# Patient Record
Sex: Male | Born: 1950 | Race: White | Hispanic: No | Marital: Married | Smoking: Current every day smoker
Health system: Southern US, Community
[De-identification: ages and names within clinical notes are randomized; demographics above are authoritative.]

## PROBLEM LIST (undated history)

## (undated) DIAGNOSIS — I10 Essential (primary) hypertension: Secondary | ICD-10-CM

## (undated) DIAGNOSIS — E785 Hyperlipidemia, unspecified: Secondary | ICD-10-CM

## (undated) HISTORY — DX: Hyperlipidemia, unspecified: E78.5

---

## 1999-10-02 ENCOUNTER — Encounter (INDEPENDENT_AMBULATORY_CARE_PROVIDER_SITE_OTHER): Payer: Self-pay | Admitting: Specialist

## 1999-10-02 ENCOUNTER — Other Ambulatory Visit: Admission: RE | Admit: 1999-10-02 | Discharge: 1999-10-02 | Payer: Self-pay | Admitting: Internal Medicine

## 2003-09-24 ENCOUNTER — Encounter: Admission: RE | Admit: 2003-09-24 | Discharge: 2003-09-24 | Payer: Self-pay | Admitting: Family Medicine

## 2003-09-24 ENCOUNTER — Encounter: Payer: Self-pay | Admitting: Family Medicine

## 2003-10-29 ENCOUNTER — Encounter: Payer: Self-pay | Admitting: Internal Medicine

## 2003-10-29 ENCOUNTER — Ambulatory Visit (HOSPITAL_COMMUNITY): Admission: RE | Admit: 2003-10-29 | Discharge: 2003-10-29 | Payer: Self-pay | Admitting: Internal Medicine

## 2004-02-26 ENCOUNTER — Inpatient Hospital Stay (HOSPITAL_COMMUNITY): Admission: AD | Admit: 2004-02-26 | Discharge: 2004-02-29 | Payer: Self-pay | Admitting: Family Medicine

## 2004-10-21 ENCOUNTER — Ambulatory Visit: Payer: Self-pay | Admitting: Family Medicine

## 2004-10-23 ENCOUNTER — Ambulatory Visit: Payer: Self-pay | Admitting: Family Medicine

## 2005-01-27 ENCOUNTER — Ambulatory Visit: Payer: Self-pay | Admitting: Cardiology

## 2005-04-16 ENCOUNTER — Inpatient Hospital Stay (HOSPITAL_COMMUNITY): Admission: RE | Admit: 2005-04-16 | Discharge: 2005-04-17 | Payer: Self-pay | Admitting: Orthopedic Surgery

## 2005-06-06 ENCOUNTER — Inpatient Hospital Stay (HOSPITAL_COMMUNITY): Admission: EM | Admit: 2005-06-06 | Discharge: 2005-06-14 | Payer: Self-pay | Admitting: Emergency Medicine

## 2005-06-06 ENCOUNTER — Ambulatory Visit: Payer: Self-pay | Admitting: Internal Medicine

## 2005-06-10 ENCOUNTER — Encounter: Payer: Self-pay | Admitting: Neurology

## 2005-06-18 ENCOUNTER — Ambulatory Visit: Payer: Self-pay | Admitting: Psychiatry

## 2005-06-18 ENCOUNTER — Inpatient Hospital Stay (HOSPITAL_COMMUNITY): Admission: RE | Admit: 2005-06-18 | Discharge: 2005-06-22 | Payer: Self-pay | Admitting: Psychiatry

## 2005-06-23 ENCOUNTER — Other Ambulatory Visit (HOSPITAL_COMMUNITY): Admission: RE | Admit: 2005-06-23 | Discharge: 2005-07-07 | Payer: Self-pay | Admitting: Psychiatry

## 2005-07-09 ENCOUNTER — Ambulatory Visit: Payer: Self-pay | Admitting: Internal Medicine

## 2005-07-13 ENCOUNTER — Ambulatory Visit (HOSPITAL_COMMUNITY): Payer: Self-pay | Admitting: Psychiatry

## 2005-07-29 ENCOUNTER — Ambulatory Visit (HOSPITAL_COMMUNITY): Payer: Self-pay | Admitting: Psychiatry

## 2005-08-26 ENCOUNTER — Ambulatory Visit (HOSPITAL_COMMUNITY): Payer: Self-pay | Admitting: Psychiatry

## 2005-10-28 ENCOUNTER — Ambulatory Visit (HOSPITAL_COMMUNITY): Payer: Self-pay | Admitting: Psychiatry

## 2005-11-30 ENCOUNTER — Ambulatory Visit (HOSPITAL_COMMUNITY): Payer: Self-pay | Admitting: Psychiatry

## 2006-03-17 ENCOUNTER — Ambulatory Visit (HOSPITAL_COMMUNITY): Payer: Self-pay | Admitting: Psychiatry

## 2006-05-07 ENCOUNTER — Ambulatory Visit: Payer: Self-pay | Admitting: Family Medicine

## 2006-05-14 ENCOUNTER — Ambulatory Visit: Payer: Self-pay | Admitting: Family Medicine

## 2006-06-18 ENCOUNTER — Ambulatory Visit: Payer: Self-pay | Admitting: Family Medicine

## 2006-06-22 ENCOUNTER — Ambulatory Visit (HOSPITAL_COMMUNITY): Payer: Self-pay | Admitting: Psychiatry

## 2006-06-29 ENCOUNTER — Emergency Department (HOSPITAL_COMMUNITY): Admission: EM | Admit: 2006-06-29 | Discharge: 2006-06-29 | Payer: Self-pay | Admitting: Emergency Medicine

## 2006-06-29 ENCOUNTER — Inpatient Hospital Stay (HOSPITAL_COMMUNITY): Admission: RE | Admit: 2006-06-29 | Discharge: 2006-07-10 | Payer: Self-pay | Admitting: Psychiatry

## 2006-06-30 ENCOUNTER — Ambulatory Visit: Payer: Self-pay | Admitting: Psychiatry

## 2006-07-03 ENCOUNTER — Ambulatory Visit (HOSPITAL_COMMUNITY): Admission: RE | Admit: 2006-07-03 | Discharge: 2006-07-03 | Payer: Self-pay | Admitting: Psychiatry

## 2006-07-12 ENCOUNTER — Other Ambulatory Visit (HOSPITAL_COMMUNITY): Admission: RE | Admit: 2006-07-12 | Discharge: 2006-09-02 | Payer: Self-pay | Admitting: Psychiatry

## 2006-07-16 ENCOUNTER — Ambulatory Visit (HOSPITAL_COMMUNITY): Payer: Self-pay | Admitting: Psychiatry

## 2006-07-27 ENCOUNTER — Ambulatory Visit (HOSPITAL_COMMUNITY): Admission: RE | Admit: 2006-07-27 | Discharge: 2006-07-27 | Payer: Self-pay | Admitting: Neurology

## 2006-08-02 ENCOUNTER — Ambulatory Visit (HOSPITAL_COMMUNITY): Payer: Self-pay | Admitting: Psychiatry

## 2007-05-10 ENCOUNTER — Ambulatory Visit: Payer: Self-pay | Admitting: Family Medicine

## 2007-05-10 LAB — CONVERTED CEMR LAB
ALT: 17 units/L (ref 0–40)
AST: 30 units/L (ref 0–37)
Albumin: 4.2 g/dL (ref 3.5–5.2)
BUN: 6 mg/dL (ref 6–23)
Basophils Relative: 0.1 % (ref 0.0–1.0)
CO2: 31 meq/L (ref 19–32)
Chloride: 102 meq/L (ref 96–112)
Creatinine, Ser: 0.9 mg/dL (ref 0.4–1.5)
Direct LDL: 135.5 mg/dL
Eosinophils Relative: 0.8 % (ref 0.0–5.0)
GFR calc Af Amer: 111 mL/min
GFR calc non Af Amer: 92 mL/min
Glucose, Bld: 99 mg/dL (ref 70–99)
Lymphocytes Relative: 30.2 % (ref 12.0–46.0)
MCHC: 33.8 g/dL (ref 30.0–36.0)
MCV: 93.7 fL (ref 78.0–100.0)
Neutro Abs: 4.7 10*3/uL (ref 1.4–7.7)
Neutrophils Relative %: 60.8 % (ref 43.0–77.0)
Platelets: 244 10*3/uL (ref 150–400)
Potassium: 3.4 meq/L — ABNORMAL LOW (ref 3.5–5.1)
TSH: 0.77 microintl units/mL (ref 0.35–5.50)
Total Bilirubin: 0.7 mg/dL (ref 0.3–1.2)
Total CHOL/HDL Ratio: 6.4

## 2007-05-17 ENCOUNTER — Ambulatory Visit: Payer: Self-pay | Admitting: Family Medicine

## 2007-06-07 ENCOUNTER — Ambulatory Visit: Payer: Self-pay | Admitting: Gastroenterology

## 2007-06-21 ENCOUNTER — Ambulatory Visit: Payer: Self-pay | Admitting: Gastroenterology

## 2008-02-28 DIAGNOSIS — J069 Acute upper respiratory infection, unspecified: Secondary | ICD-10-CM | POA: Insufficient documentation

## 2008-03-02 ENCOUNTER — Ambulatory Visit: Payer: Self-pay | Admitting: Family Medicine

## 2008-03-03 DIAGNOSIS — F329 Major depressive disorder, single episode, unspecified: Secondary | ICD-10-CM

## 2008-03-03 DIAGNOSIS — I10 Essential (primary) hypertension: Secondary | ICD-10-CM | POA: Insufficient documentation

## 2008-03-03 DIAGNOSIS — E785 Hyperlipidemia, unspecified: Secondary | ICD-10-CM

## 2008-03-03 DIAGNOSIS — J449 Chronic obstructive pulmonary disease, unspecified: Secondary | ICD-10-CM

## 2008-03-03 DIAGNOSIS — F068 Other specified mental disorders due to known physiological condition: Secondary | ICD-10-CM

## 2008-03-03 DIAGNOSIS — G609 Hereditary and idiopathic neuropathy, unspecified: Secondary | ICD-10-CM | POA: Insufficient documentation

## 2008-03-03 DIAGNOSIS — F411 Generalized anxiety disorder: Secondary | ICD-10-CM | POA: Insufficient documentation

## 2008-04-02 ENCOUNTER — Telehealth: Payer: Self-pay | Admitting: Family Medicine

## 2008-04-07 ENCOUNTER — Encounter: Payer: Self-pay | Admitting: Family Medicine

## 2008-05-18 ENCOUNTER — Ambulatory Visit: Payer: Self-pay | Admitting: Family Medicine

## 2008-05-18 DIAGNOSIS — M216X9 Other acquired deformities of unspecified foot: Secondary | ICD-10-CM

## 2008-05-18 DIAGNOSIS — I080 Rheumatic disorders of both mitral and aortic valves: Secondary | ICD-10-CM | POA: Insufficient documentation

## 2008-11-27 ENCOUNTER — Telehealth: Payer: Self-pay | Admitting: *Deleted

## 2009-04-02 ENCOUNTER — Emergency Department (HOSPITAL_COMMUNITY): Admission: EM | Admit: 2009-04-02 | Discharge: 2009-04-02 | Payer: Self-pay | Admitting: Emergency Medicine

## 2009-04-27 IMAGING — CT CT HEAD W/O CM
1 of 2 series · 13 of 30 positions shown, 17 images · non-contrast
Comparison: 06/06/2005 and brain MR dated 07/03/2006.

CLINICAL DATA: Seizure today.  One previous seizure 3 years ago.
Taking antiseizure medications.

CT HEAD WITHOUT CONTRAST
TECHNIQUE: Contiguous axial images were obtained from the base of
the skull through the vertex without contrast.

[Series 2: brain · axial · 0.47mm/px · z∈[+148,+279]mm · 13 of 28 slices shown, 17 images]
[im 2/28  brain]
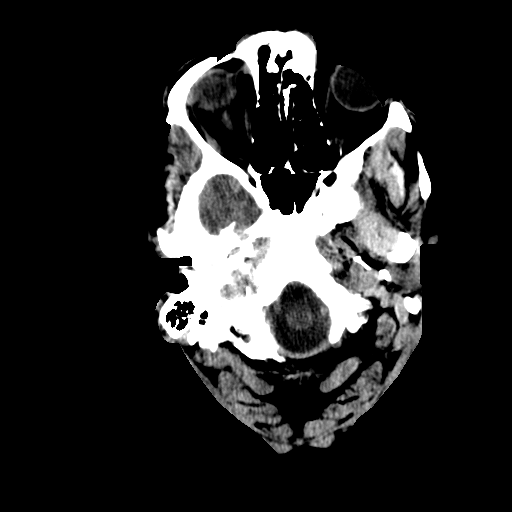
[im 2/28  bone]
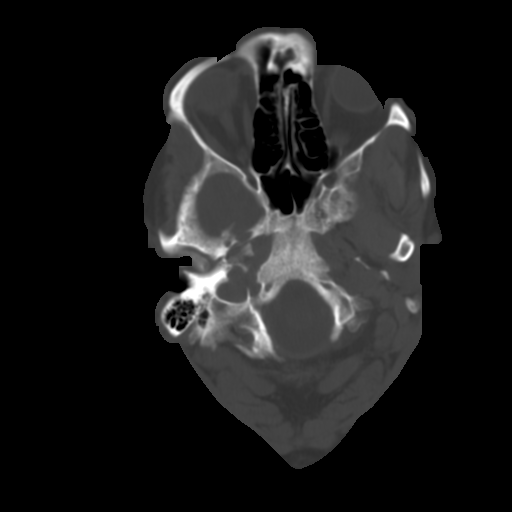
[im 4/28  brain]
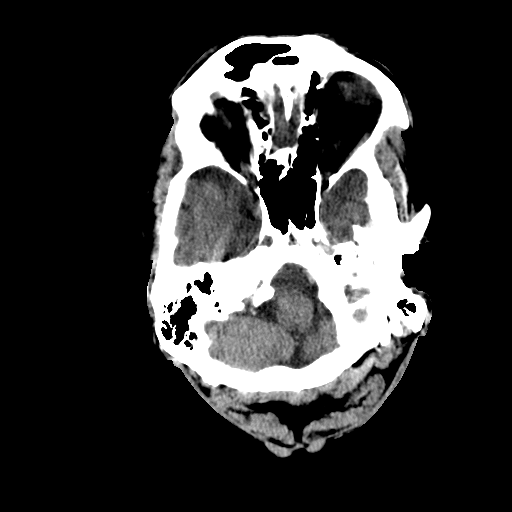
[im 6/28  brain]
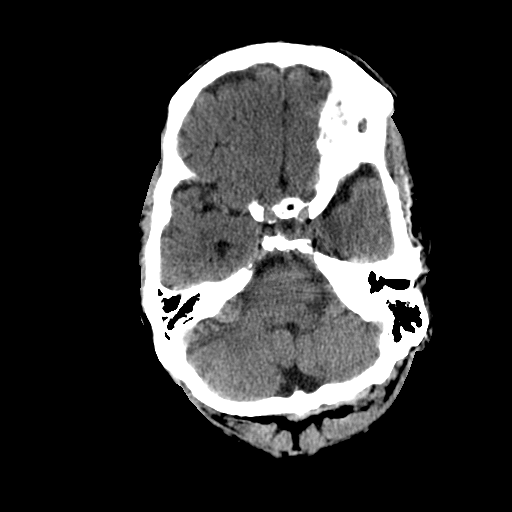
[im 8/28  brain]
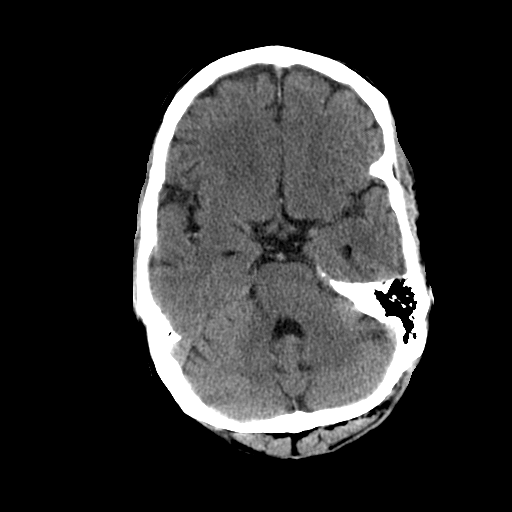
[im 10/28  brain]
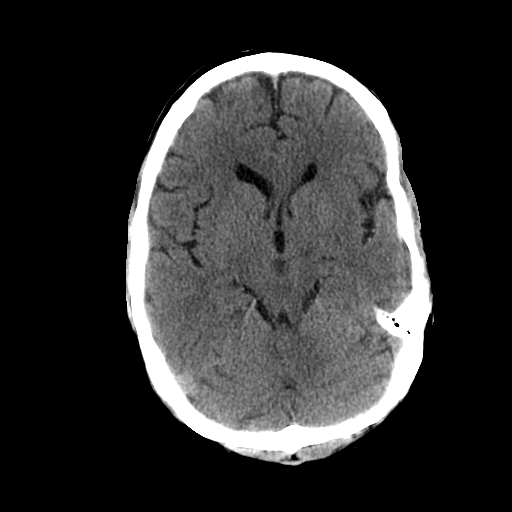
[im 10/28  bone]
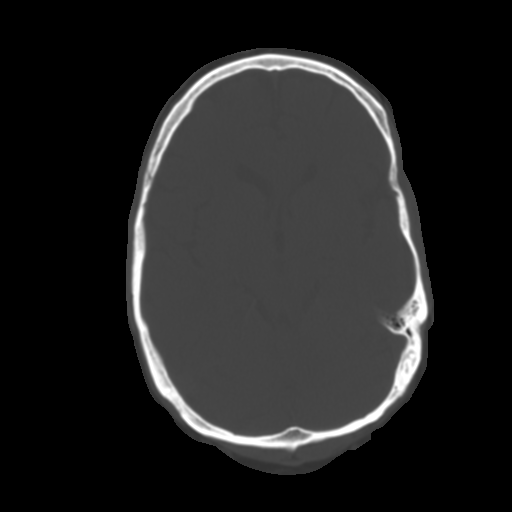
[im 12/28  brain]
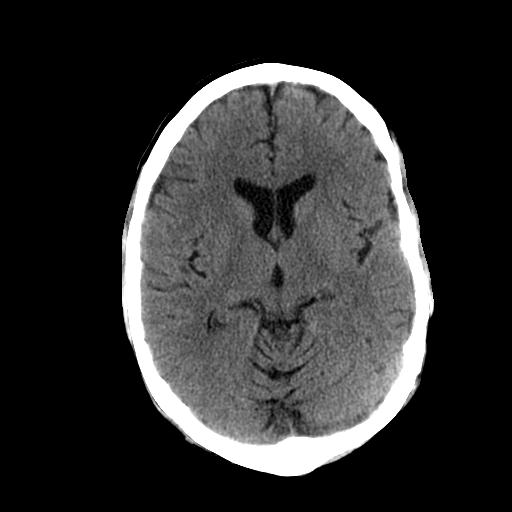
[im 14/28  brain]
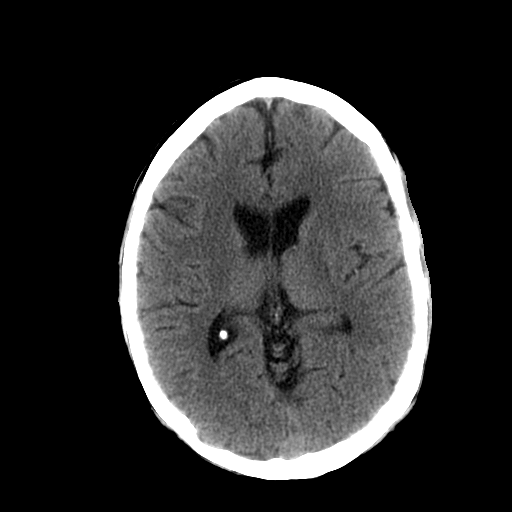
[im 16/28  brain]
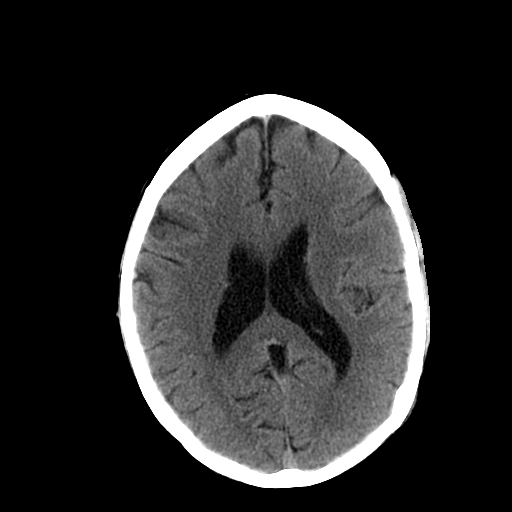
[im 18/28  brain]
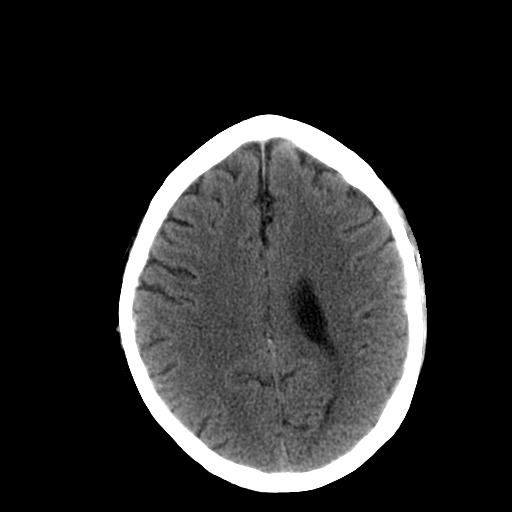
[im 18/28  bone]
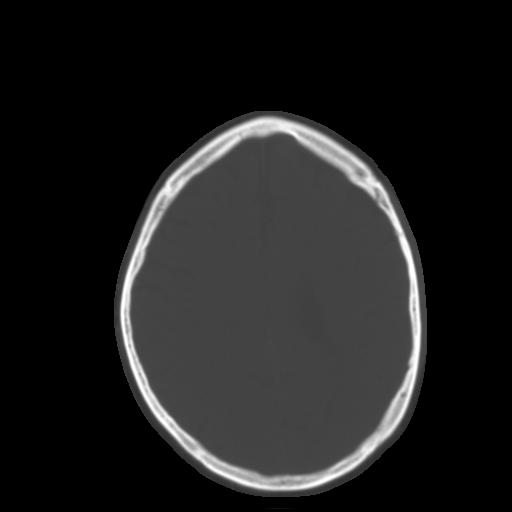
[im 20/28  brain]
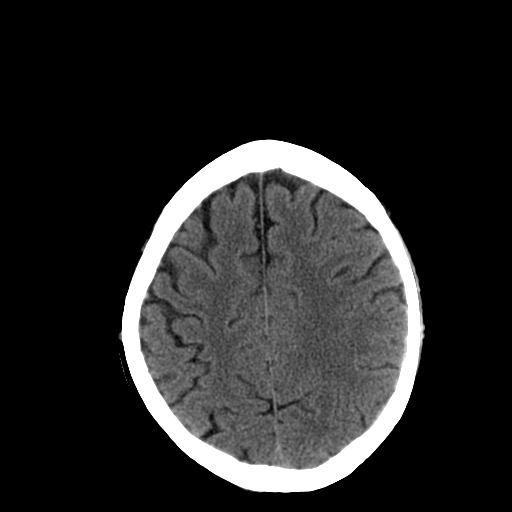
[im 22/28  brain]
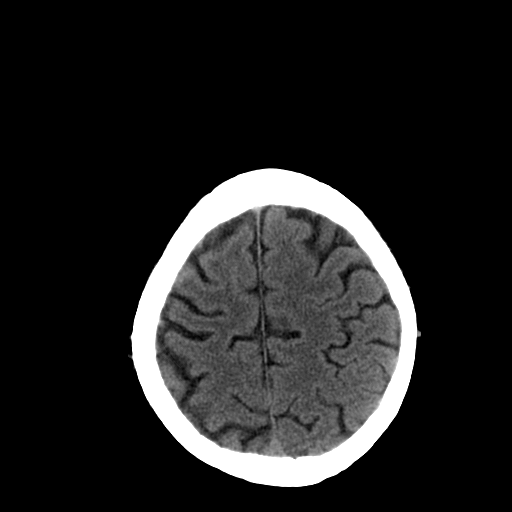
[im 24/28  brain]
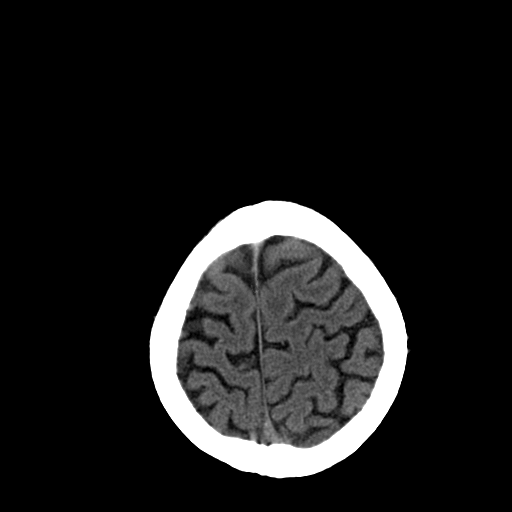
[im 26/28  brain]
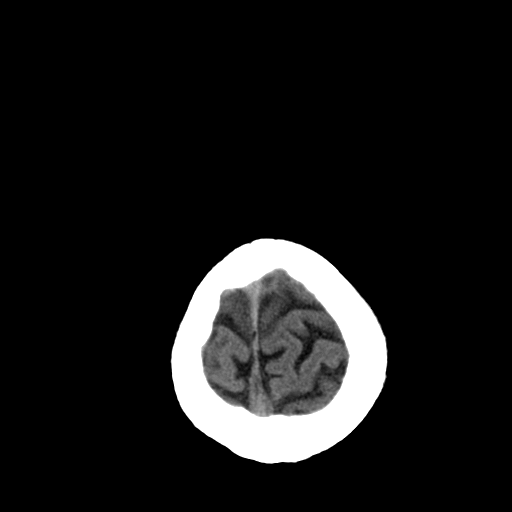
[im 26/28  bone]
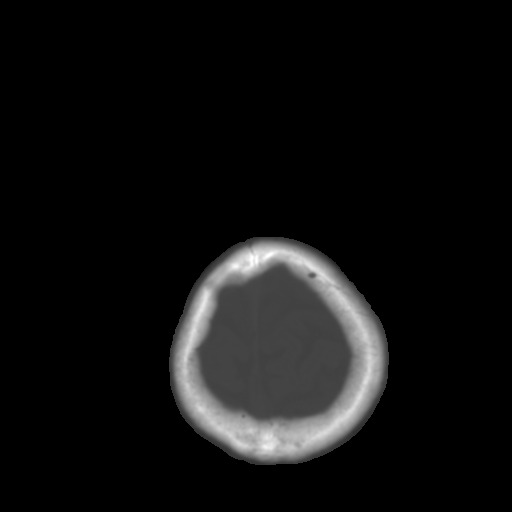

[13 of 30 positions shown; findings below may reference images not displayed]

FINDINGS: Stable minimally enlarged ventricles and subarachnoid
spaces.  No intracranial hemorrhage, mass lesions or CT evidence of
acute infarction.  Unremarkable bones and included paranasal
sinuses.
IMPRESSION: Stable minimal diffuse cerebral and cerebellar atrophy.  No acute
abnormality.

## 2009-06-04 ENCOUNTER — Ambulatory Visit: Payer: Self-pay | Admitting: Family Medicine

## 2009-06-04 LAB — CONVERTED CEMR LAB
ALT: 17 units/L (ref 0–53)
AST: 35 units/L (ref 0–37)
Alkaline Phosphatase: 98 units/L (ref 39–117)
BUN: 9 mg/dL (ref 6–23)
Basophils Absolute: 0 10*3/uL (ref 0.0–0.1)
Basophils Relative: 0.2 % (ref 0.0–3.0)
Bilirubin, Direct: 0 mg/dL (ref 0.0–0.3)
Blood in Urine, dipstick: NEGATIVE
Calcium: 9.3 mg/dL (ref 8.4–10.5)
GFR calc non Af Amer: 80.98 mL/min (ref >60)
Glucose, Bld: 110 mg/dL — ABNORMAL HIGH (ref 70–99)
Glucose, Urine, Semiquant: NEGATIVE
Lymphs Abs: 1.6 10*3/uL (ref 0.7–4.0)
MCHC: 36 g/dL (ref 30.0–36.0)
MCV: 94 fL (ref 78.0–100.0)
Monocytes Absolute: 0.4 10*3/uL (ref 0.1–1.0)
RBC: 4.36 M/uL (ref 4.22–5.81)
RDW: 12.5 % (ref 11.5–14.6)
Total Protein: 7.1 g/dL (ref 6.0–8.3)
WBC: 6.2 10*3/uL (ref 4.5–10.5)

## 2009-06-11 ENCOUNTER — Ambulatory Visit: Payer: Self-pay | Admitting: Family Medicine

## 2009-08-13 ENCOUNTER — Telehealth: Payer: Self-pay | Admitting: *Deleted

## 2010-10-17 ENCOUNTER — Encounter: Payer: Self-pay | Admitting: Family Medicine

## 2010-10-17 ENCOUNTER — Ambulatory Visit: Payer: Self-pay | Admitting: Family Medicine

## 2011-01-15 NOTE — Assessment & Plan Note (Signed)
Summary: med /pt rescd//ccm   Vital Signs:  Patient profile:   60 year old male Height:      68.5 inches Weight:      206 pounds BMI:     30.98 Temp:     98.3 degrees F oral BP sitting:   124 / 90  (left arm) Cuff size:   regular  Vitals Entered By: Kern Reap CMA Duncan Dull) (October 17, 2010 9:50 AM) CC: follow-up visit   CC:  follow-up visit.  History of Present Illness: Cody Perez is a 60 year old male, who comes in today for general medical checkup.  He states he had his lab work done through his wife's employment.  However, we don't have a copy of the lab work.  He takes Tenoretic 50 -- 25 one half tab daily for hy C. list of medications.  She has them on.  History [place], care, dental care, colonoscopy 2000, normal, tetanus, 2010, seasonal flu shot today.  He has quit smoking........Marland Kitchenpertension.  BP 124/90.  He takes simvastatin 10 mg nightly for hyperlipidemia, along with an aspirin tablet.  He uses Viagra p.r.n. for rectal dysfunction.  His biggest long-term problem is his mental health problems, which include anxiety, dementia, and depression, which are treated by Dr. Nolen Mu  Allergies: No Known Drug Allergies  Past History:  Past medical, surgical, family and social histories (including risk factors) reviewed, and no changes noted (except as noted below).  Past Medical History: Reviewed history from 05/18/2008 and no changes required. Anxiety COPD Dementia Depression bipolar Hyperlipidemia Hypertension Peripheral neuropathy erectile dysfunction  Family History: Reviewed history from 03/02/2008 and no changes required. adopted  Social History: Reviewed history from 03/02/2008 and no changes required. Retired Married Former Smoker Alcohol use-no Drug use-no Regular exercise-yes  Review of Systems      See HPI       Flu Vaccine Consent Questions     Do you have a history of severe allergic reactions to this vaccine? no    Any prior history of  allergic reactions to egg and/or gelatin? no    Do you have a sensitivity to the preservative Thimersol? no    Do you have a past history of Guillan-Barre Syndrome? no    Do you currently have an acute febrile illness? no    Have you ever had a severe reaction to latex? no    Vaccine information given and explained to patient? yes    Are you currently pregnant? no    Lot Number:[id number]   Exp Date:06/13/2011   Site Given  Left Deltoid IM   Physical Exam  General:  Well-developed,well-nourished,in no acute distress; alert,appropriate and cooperative throughout examination Head:  Normocephalic and atraumatic without obvious abnormalities. No apparent alopecia or balding. Eyes:  No corneal or conjunctival inflammation noted. EOMI. Perrla. Funduscopic exam benign, without hemorrhages, exudates or papilledema. Vision grossly normal. Ears:  External ear exam shows no significant lesions or deformities.  Otoscopic examination reveals clear canals, tympanic membranes are intact bilaterally without bulging, retraction, inflammation or discharge. Hearing is grossly normal bilaterally. Nose:  External nasal examination shows no deformity or inflammation. Nasal mucosa are pink and moist without lesions or exudates. Mouth:  Oral mucosa and oropharynx without lesions or exudates.  Teeth in good repair. Neck:  No deformities, masses, or tenderness noted. Chest Wall:  barrel-shaped chest wall Lungs:  Normal respiratory effort, chest expands symmetrically. Lungs are clear to auscultation, no crackles or wheezes. Heart:  Normal rate and regular rhythm. S1 and  S2 normal without gallop, murmur, click, rub or other extra sounds. Abdomen:  Bowel sounds positive,abdomen soft and non-tender without masses, organomegaly or hernias noted. Msk:  No deformity or scoliosis noted of thoracic or lumbar spine.   Pulses:  R and L carotid,radial,femoral,dorsalis pedis and posterior tibial pulses are full and equal  bilaterally Extremities:  No clubbing, cyanosis, edema, or deformity noted with normal full range of motion of all joints.   Neurologic:  No cranial nerve deficits noted. Station and gait are normal. Plantar reflexes are down-going bilaterally. DTRs are symmetrical throughout. Sensory, motor and coordinative functions appear intact.   Impression & Recommendations:  Problem # 1:  HYPERTENSION (ICD-401.9) Assessment Improved  His updated medication list for this problem includes:    Atenolol-chlorthalidone 50-25 Mg Tabs (Atenolol-chlorthalidone) .Marland Kitchen... Take one half tablet in morning  Problem # 2:  HYPERLIPIDEMIA (ICD-272.4) Assessment: Improved  His updated medication list for this problem includes:    Simvastatin 10 Mg Tabs (Simvastatin) .Marland Kitchen... Take one tablet at bedtime  Problem # 3:  Preventive Health Care (ICD-V70.0) Assessment: Unchanged  Complete Medication List: 1)  Aspirin 81 Mg Tbec (Aspirin) .... Take one tablet daily 2)  Clonazepam 1 Mg Tabs (Clonazepam) .... Take one table three times a day 3)  Atenolol-chlorthalidone 50-25 Mg Tabs (Atenolol-chlorthalidone) .... Take one half tablet in morning 4)  Simvastatin 10 Mg Tabs (Simvastatin) .... Take one tablet at bedtime 5)  Risperdal 0.5 Mg Tabs (Risperidone) .... Take one tablet at bedtime 6)  Aricept 10 Mg Tabs (Donepezil hcl) .... Take one tablet at bedtime 7)  B Complete Tabs (B complex-biotin-fa) .... Take one tablet daily 8)  [facility] Spectravite Senior Tabs (Multiple vitamins-minerals) .... Take one tablet daily 9)  Viagra 100 Mg Tabs (Sildenafil citrate) .... Uad 10)  Cogentin 1 Mg/ml Soln (Benztropine mesylate) .... Take one tab two times a day 11)  Lamictal 25 Mg Tabs (Lamotrigine) .... Once daily 12)  Zyprexa 20 Mg Tabs (Olanzapine) .... Once daily 13)  Seroquel 300 Mg Tabs (Quetiapine fumarate) .... At bedtime  Other Orders: Admin 1st Vaccine (60454) Flu Vaccine 48yrs + [phone])  Patient Instructions: 1)  please  have [name] send me a copy of your lab work.  I will then call you to discuss the findings 2)  Please schedule a follow-up appointment in 1 year. 3)  It is important that you exercise regularly at least 20 minutes 5 times a week. If you develop chest pain, have severe difficulty breathing, or feel very tired , stop exercising immediately and seek medical attention. 4)  Take an Aspirin every day. Prescriptions: VIAGRA 100 MG  TABS (SILDENAFIL CITRATE) UAD  #6 x 11   Entered and Authorized by:   Roderick Pee MD   Signed by:   Roderick Pee MD on 10/17/2010   Method used:   Print then Give to Patient   RxID:   [id number] SIMVASTATIN 10 MG  TABS (SIMVASTATIN) take one tablet at bedtime  #100 Tablet x 3   Entered and Authorized by:   Roderick Pee MD   Signed by:   Roderick Pee MD on 10/17/2010   Method used:   Print then Give to Patient   RxID:   [id number] ATENOLOL-CHLORTHALIDONE 50-25 MG  TABS (ATENOLOL-CHLORTHALIDONE) take one half tablet in morning  #100 Tablet x 1   Entered and Authorized by:   Roderick Pee MD   Signed by:   Roderick Pee MD on 10/17/2010  Method used:   Print then Give to Patient   RxID:   [id number]    Orders Added: 1)  Admin 1st Vaccine [90471] 2)  Flu Vaccine 81yrs + [56387] 3)  Est. Patient 40-64 years [56433]

## 2011-01-16 ENCOUNTER — Other Ambulatory Visit: Payer: Self-pay | Admitting: Family Medicine

## 2011-03-25 LAB — POCT I-STAT, CHEM 8
Calcium, Ion: 1.07 mmol/L — ABNORMAL LOW (ref 1.12–1.32)
Chloride: 91 mEq/L — ABNORMAL LOW (ref 96–112)
Hemoglobin: 13.6 g/dL (ref 13.0–17.0)
TCO2: 26 mmol/L (ref 0–100)

## 2011-03-25 LAB — CBC
Hemoglobin: 13.5 g/dL (ref 13.0–17.0)
MCV: 96.3 fL (ref 78.0–100.0)
Platelets: 191 10*3/uL (ref 150–400)
RDW: 12.5 % (ref 11.5–15.5)
WBC: 5.9 10*3/uL (ref 4.0–10.5)

## 2011-03-25 LAB — DIFFERENTIAL
Basophils Relative: 0 % (ref 0–1)
Eosinophils Relative: 1 % (ref 0–5)
Monocytes Absolute: 0.4 10*3/uL (ref 0.1–1.0)
Neutro Abs: 4.1 10*3/uL (ref 1.7–7.7)
Neutrophils Relative %: 69 % (ref 43–77)

## 2011-03-25 LAB — URINALYSIS, ROUTINE W REFLEX MICROSCOPIC
Bilirubin Urine: NEGATIVE
Specific Gravity, Urine: 1.014 (ref 1.005–1.030)
Urobilinogen, UA: 0.2 mg/dL (ref 0.0–1.0)

## 2011-03-25 LAB — GLUCOSE, CAPILLARY

## 2011-05-01 NOTE — Discharge Summary (Signed)
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  He was voluntarily admitted on June 29, 2006.  He came to [place] with  a history of depression and declining function over the previous several  weeks.  He had difficulty eating, had not slept in 4 days, complained that  food was tasteless.  He had  excessively taken his prescription of Geodon  and Zoloft.  He had a history of very heavy alcohol abuse for many years,  but had stopped drinking in 2005 and had not since relapsed.  Please refer  to the admission note for further details pertaining to the symptoms,  circumstances and history that led to his hospitalization.  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   MEDICAL AND LABORATORY:  The patient was medically and physically assessed  by the psychiatric nurse practitioner.  Also, during the course of his  inpatient stay, he received neurological consultation from Dr. Sharene Skeans, and  repeat MRI scan of the brain.   The patient had a history of neurological care during the previous year by  Dr. Vickey Huger, in April 2007.  That was to assess problems of foot deformity  and possible neuropathy.  He had had various spinal scans and EMGs  performed that suggested spinal stenosis, but that had not been found on  MRI.   There had also been previous concern about seizure disorder, and this had  been evaluated extensively, and resulted in the eventual prescription of  valproic acid, then  Keppra.  He was not continued on these medications for  reasons that were not clear.  He had a brief psychiatric inpatient admission  to our facility in August 2006, and responded well to treatment at that time  for depression.   During this hospitalization, and the patient appeared to present with signs  and symptoms of dementia, including significant cognitive difficulties  including short-term memory, intentional deficits, with some confusion and  disorientation.  His wife reported that these cognitive deficits had  developed relatively rapidly, over the 1-2 months prior to admission.  Based  upon this information, a repeat MRI scan was obtained, and Dr. Sharene Skeans was  called in for consultation.  Although the radiology reports had suggested  some significant change in the interval between MRI scans of 2006 and  current, Dr. Sharene Skeans, on his review, did not feel that there was  significant change.  Nonetheless, scans indicated global, patchy cerebral  and cerebellar atrophy, without any localized lesion, mass effect, or other  specific abnormality.  Dr. Darl Householder  impression was that of dementia.  It  was noted that the patient had a history of heavy alcohol consumption  until  2 years ago.  It was not clear to what degree previous alcohol abuse might  have contributed to the onset of his more recent presentation of dementia.  Please refer to Dr. Darl Householder  consultation note for further details  pertaining to the overall neurological impression.  Dr. Sharene Skeans recommended  trials of Aricept 5 mg q.h.s. and Exelon 3 mg daily, which was begun.   In addition to the above, the patient was continued on Zocor 10 mg daily,  Tenormin 75 mg daily, and Hygroton 37.5 mg daily.  He was also given  thiamine 100 mg daily.   HOSPITAL COURSE:  The patient was admitted to the adult inpatient  psychiatric service.  He presented as a normally developed, slender  gentleman who appeared older than his 55  years.  The initial impression upon  meeting the patient was that of an individual with presenile dementia.  His  concentration and attention appeared impaired.  Although it was clear that  this was a highly educated and intelligent gentleman, he had some difficulty  with word finding, and thought latency.  He was able to acknowledge short-  term memory difficulties, but remote and distant memory appeared intact.  He  was moderately anxious.  He recognized that he was in a psychiatric hospital  and needed help, but did not seem to have full insight into the degree of  the deficits that had been accruing in  the recent past.   At times there were odd, autistic like behaviors that suggested underlying  psychosis.  Because of this, and because of accompanying anxiety and mild  agitation, he was begun on low-dose Risperdal, with reasonably good results.  Risperdal was tolerated without excessive sedation.  He was continued on  Zoloft 50 mg daily and Klonopin 1 mg twice daily.   I had frequent telephone contact with the patient's wife, regarding our  concerns about underlying neurological disease and apparent dementia, and  various considerations regarding future care.   It was noted that during the latter portion of his inpatient stay, the  patient was neither agitated, nor did he appear anxious or depressed.  He  was  cooperative, and non impulsive.  Although he complained of poor sleep  consistently, his sleep actually appeared to be fairly good, per staff  reports.  His appetite was adequate.  He was generally pleasant and in no  distress, but he continued to display the cognitive deficits as previously  described.   There was discussion with the patient's wife about the possibility of him  needing a skilled nursing facility or an assisted living facility, but based  upon his level of functioning at the time of discharge, it was felt reasonable to try having the patient live at home  with wife, which was her  preference.   I also discussed with the patient's wife neurological follow-up.  Although  it appeared that she intended to continue to have Dr. Darl Householder  neurological group be the primary Eirene Rather, she was considering the  possibility of getting a second neurological opinion, with the purpose of  seeing if there was anything additional the could be done on her husband's  behalf in terms of treatment for dementia, or further elucidation of the  etiology of his dementia.  It should be noted that during his inpatient  stay, B12 and folate levels were  obtained, which appeared to be within  normal limits.  Also, an electroencephalogram was and obtained at the  direction of Dr. Sharene Skeans.  Results of that examination were not available  at the time of this dictation.   AFTERCARE:  The patient was discharged, significantly improved, with a plan  to return home with his wife.  He was to follow-up with Dr. Darl Householder  group.  It was also indicated that we would be able to provide outpatient  psychiatric services if desired by the patient and his wife.  There was also  possibility of him returning to a previous therapist, Areta Haber.   DISCHARGE MEDICATIONS:  Klonopin 1 mg b.i.d., Zoloft 50 mg daily, Risperdal  2 mg q.h.s., Ambien 10 mg at bedtime as needed for sleep, Aricept 5 mg  q.h.s., Exelon 3 mg q.h.s., Zocor 10 mg q.p.m., Tenormin 75 mg daily,  Hygroton 37.5 mg daily, and thiamine 100 mg daily.   DISCHARGE DIAGNOSES:  AXIS I:  1.  Dementia, presenile, etiology unclear.  2.  Mood disorder not otherwise specified.  AXIS II: Deferred.  AXIS III: History of hypertension,  AXIS IV: Stressors severe.  AXIS V: Global assessment of functioning on discharge 45.           ______________________________  Anselm Jungling, MD  Electronically Signed     SPB/MEDQ  D:  07/12/2006  T:  07/12/2006  Job:  [id number]

## 2011-05-01 NOTE — Procedures (Signed)
REDICTATION:   CLINICAL HISTORY:  The patient is a 60 year old with possible seizures.  He  had 3 generalized tonic-clonic seizures and episodes of unresponsiveness and  bizarre behavior.  Study is being done to look for presence of a seizure  disorder.   PROCEDURE:  The tracing was carried out of 32-channel digital Cadwell  recorder reformatted to 16-channel montages with one devoted to EKG.  The  patient was awake and drowsy during the recording.  The International 10/20  System lead placement was used.   DESCRIPTION OF FINDINGS:  Dominant frequency is a 9-Hz 20- to 30-microvolt  activity that is well-regulated and attenuates partially with eye-opening.  Mixed-frequency rhythmic theta and upper delta range activity is seen  occasionally superimposed on this background and frontally predominant beta  range activity.   At the beginning of the record, there appears to be and electrical  disturbance CZ.  It could not be immediately determined whether or not this  represented electrode artifact; however, sharply contoured slow wave  activity was seen there and was continuous, associated with the lower theta  to upper delta range activity and superimposed beta range components.  Amplitudes were about 50 microvolts.   Montages were changed and it was evident that the patient was having  electrographic seizure covering the frontal and central regions bilaterally  symmetrically, again with irregularly contoured rhythmic and semi-rhythmic  delta range activity of 50-60 microvolts with simultaneous well-defined  alpha range activity in the occipital regions.  The entire episode lasted  for about 420 seconds covering page 10, where it could be clearly seen as  different from the background up to page 57; thereafter, the background  showed generalized rhythmic theta range activity and toward the end, the  alpha range activity returned and the background seemed rather symmetric  with no  interictal epileptiform activity during that time.   EKG showed a regular sinus rhythm with ventricular response of 84 beats per  minute.   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  Background is otherwise normal.       ZOX:WRUE  D:  06/09/2005 10:20:56  T:  06/09/2005 11:18:37  Job #:  [id number]   cc:   Melvyn Novas, M.D.  [address]  [address]  [place]  [place] [address]  Fax: [phone]   Corwin Levins, M.D. [facility]

## 2011-05-01 NOTE — Procedures (Signed)
EEG NUMBER:  [id number]   REQUESTING PHYSICIAN:  Dr. Ellison Carwin   ATTENDING PHYSICIAN:  Dr. Geralyn Flash   CLINICAL HISTORY:  A 60 year old man with suspected dementia.  EEG is  performed for evaluation.   DESCRIPTION:  The dominant rhythm of this tracing is a moderate amplitude  alpha rhythm of 9-10 Hz which predominates posterior, appears without  abnormal asymmetry and attenuates with eye opening and closing.  Low  amplitude fast activity is seen frontally and centrally and appears without  abnormal asymmetry.  No focal slowing is noted.  No epileptiform discharges  are seen.  The patient remained awake throughout the recording.  Hyperventilation produced no significant changes in the background rhythms.  Photic stimulation was not performed.  Single channel devoted to EKG  revealed sinus rhythm throughout with a rate of approximately 84 beats per  minute.   CONCLUSION:  Normal study in the awake state.      Michael L. Thad Ranger, M.D.  Electronically Signed     WUJ:[id number]  D:  07/07/2006 23:20:21  T:  07/08/2006 06:56:21  Job #:  [id number]

## 2011-05-01 NOTE — Discharge Summary (Signed)
Cody Perez, EVES NO.:  [id number]   MEDICAL RECORD NO.:  [id number]          PATIENT TYPE:  INP   LOCATION:  3030                         FACILITY:  [facility]   PHYSICIAN:  Genene Churn. Love, M.D.    DATE OF BIRTH:  06-08-1951   DATE OF ADMISSION:  06/09/2005  DATE OF DISCHARGE:  06/14/2005                           DISCHARGE SUMMARY - REFERRING   PATIENT'S ADDRESS:  [address] [place], [place] [address].   REASON FOR ADMISSION:  This is one of several [facility] admissions  for this 60 year old right-handed white married male from [place], [place], admitted June 06, 2005 to Dr. Fayrene Fearing John's service and  subsequently transferred to Dr. Porfirio Mylar Dohmeier's service for evaluation of  psychosis and suspected seizures.   HISTORY OF PRESENT ILLNESS:  Mr. Pilley has a known prior history of alcohol  abuse with alcoholic hepatitis. He was admitted for detoxification in March  2005. He has not been drinking alcohol since that time. He has a known  history of hypertension and has been on narcotic medication for triple  arthrodesis to his left ankle with pain occurring in May of 2006. He was in  his usual state of health. He is a Radio producer, a Interior and spatial designer. The evening of June 06, 2005 he had been exhibiting some manic  behavior working very hard at night. On the evening of June 06, 2005, his  wife heard a loud noise and this was approximately midnight June 05, 2005 to  June 06, 2005 and found him with tightening of his muscles, his eyes rolled  back, and seesaw movements of his legs. This was thought to represent a  witnessed seizure. He was able to talk afterwards. He then had a second and  suspected third seizure while in the ambulance en route to the hospital. He  was noted by Dr. Jonny Ruiz, who initially saw him, to be confused. He was  subsequently seen by Dr. Melvyn Novas, Neurology consultant, who felt  that the patient had new  onset seizures and wanted to evaluate the patient  for a toxic and metabolic disorder. The patient had EEG's on June 26, June  28, and June 12, 2005. The first EEG on June 08, 2005 was thought to show  abnormal EEG characterized by electrographic seizure with origins in the  central vertex, spreading to the frontal regions, lasting 7 minutes  duration, consistent with localization of a complex partial seizure in the  frontal lobe. The EEG on June 10, 2005 was normal. The EEG on June 12, 2005  was thought to show epileptiform discharges, emanating from the vertex with  secondary generalization.   PAST MEDICAL HISTORY:  Significant for hypertension, alcoholism with no  alcohol since March of 2005, mitral regurgitation, alcoholic hepatitis,  hyperlipidemia, chronic obstructive pulmonary disease, eczema, history of  mitral regurgitation.   PHYSICAL EXAMINATION:  NEUROLOGIC:  Examination at the time of admission  revealed that the patient appeared to be somewhat euphoric. He was thought  to have focal seizures with eye deviation to the right according to Dr.  Dohmeier's note and focal twitching of his upper and lower extremities. He  was thought to have evidence of automatisms. He was alert with slowed speech  between episodes and would follow commands and had essentially, a non-focal  neurologic examination.   LABORATORY DATA:  White blood cell count was 8800 at the time of admission.  Hemoglobin and hematocrit 12.4 and 36.6. His platelet count was 235,000. The  differential was 85% polyps, 10% lymphs, 5% monocytes, 1% eosinophils, and  0% basophils. His initial sodium was 134, potassium 3.5, chloride 99, CO2  content 19, glucose 103, BUN 7, creatinine 1.2, calcium 9.3, total protein  6.5, albumin 3.7. AST 39 with upper limits of normal 37. ALT 15 with upper  limits of normal 40, ALP 81. Total protein was 0.5. Direct bilirubin was  0.1. His urinalysis was unremarkable. Urine was positive  for benzodiazepines  and positive for opiates, which he had been on for pain at home. His CSF  showed 10 red blood cells, 4 white blood cells. His protein was 70. His CSF  glucose was 75. Cryptococcal antigens were negative. A TSH was low at 0.337.  His hepatitis A antibody, hepatitis B surface antibody, hepatitis C antibody  was negative. His acute hepatitis profile was negative. His lipid profile  revealed cholesterol of 136, triglycerides 99, HDL 47, LDL 69. His ammonia  was initially 59 on June 08, 2005 and repeat was 63 on June 10, 2005 with a  level pending. A repeat TSH was 1.126. Valproic acid levels on June 10, 2005  was 57.3, June 12, 2005 at 62.7, and June 13, 2005 was 22.   His EKG showed sinus tachycardia but otherwise was a normal EKG.   View of his left foot showed chronic findings related to hind-foot fusion.  No identifiable acute abnormality.   Abdominal ultrasound showed some small gallstones with slight sludge. No  changes of acute cholecystitis.   MRI of the brain with and without contrast was compromised by motion. Was  incomplete but there was no evidence of a focal abnormality noted. Head CT  scan was negative, non-contrast study.   HOSPITAL COURSE:  The patient was admitted and had episode of starring  automatisms and there was a concern that he was having ongoing seizures in  view of the initial EEG on June 08, 2005. He was placed on Depakote and  built up to 1,000 mg b.i.d. with low normal therapeutic levels. He was  started on Keppra and built up to 750 mg q.i.d. He was seen in consultation  by Dr. Jeanie Sewer who felt that the patient had evidence of psychosis and  bipolar disorder and began the patient on Geodon. The patient would have  episodes in which he would be very responsive and talk, but in other  episodes would have what appeared to be blank stares with automatisms. He  also had some myoclonic jerking at times of his upper and lower  extremities. Comprehensive metabolic panel on June 14, 2005 was within normal limits  except for slightly elevated SGOT of 43 with a serum ammonia pending.   IMPRESSION:  1.  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  2.  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  3.  History of complex partial seizure suspected (Code 345.40).  4.  History of alcoholic hepatitis. (Code 573.3).  5.  Chronic obstructive pulmonary disease (Code 496).  6.  Former alcoholism (Code 303.91).  7.  Hypertension (Code 796.2).  8.  Hyperlipidemia (Code 272.4).  9.  History of mitral regurgitation (Code 424.0).  10. Heat rash over his lower back and posterior thigh regions.   PLAN:  At this time, transfer the patient to Dr. Alphonzo Dublin and Dr. Cecille Aver at  [facility]. Pending studies at time of  discharge are urine for heavy metals, and porphobilinogen's and serum  ammonia.   DIET:  He is discharged on a dysphagia 1 diet.   DISCHARGE MEDICATIONS:  1.  Geodon 40 mg p.o. b.i.d.  2.  Valproic acid 1000 mg b.i.d.  3.  Keppra 750 mg q.i.d.   He had received acyclovir and Rocephin in the hospital, both of which were  discontinued. Other medications that the patient has been on in the past  include:  1.  Zocor 20 mg daily.  2.  Vicodin p.r.n. for left ankle pain.  3.  Librium and Xanax (exact dosage unknown).  4.  Atenolol/hydrochlorothiazide 50/25 1/2 tablet.  5.  Chlordiazepoxide 25 mg 2 b.i.d.  6.  Hydrocodone p.r.n.   PLAN:  At this time, transfer the patient for further evaluation. It is not  clear whether this could be a drug withdrawal from benzodiazepines, vertex  seizure disorder which is very rare, or stereotypical involuntary movement  related to his psychosis.       JML/MEDQ  D:  06/14/2005  T:  06/14/2005  Job:  [id number]   cc:   Corwin Levins, M.D. [facility]   Kendell Bane, M.D.  [facility]    Antonietta Breach, M.D.

## 2011-05-01 NOTE — Procedures (Signed)
REFERRING PHYSICIAN:  Melvyn Novas, M.D.   CLINICAL HISTORY:  A 54-year male being evaluated for possible seizures.  He  has had 3 episodes of grand mal seizures.   MEDICATION LISTED:  Dilantin and Depakote.   DESCRIPTION OF PROCEDURE:  This is a portable 17-channel EEG recorded using  standard 10/20 electrode placement.   Background awake rhythm consists of 9- to 10-Hz alpha which is of moderate  amplitude, synchronous and reactive to eye-opening and closure.  No  paroxysmal epileptiform activity spikes or sharp waves are seen.  Sleep  changes are not seen, except for a mild drowsiness, which is uneventful.  Hyperventilation and photic stimulation were not performed.  Technical  component of study is excellent.  EKG tracing reveals regular sinus rhythm.   IMPRESSION:  This EEG performed in wakefulness and light sleep is within  normal limits.  No definite epileptiform activity is identified.       ZOX:WRUE  D:  06/08/2005 18:37:05  T:  06/09/2005 05:47:51  Job #:  [id number]

## 2011-05-01 NOTE — Procedures (Signed)
CONCLUSION:  Normal EEG for the patient's age and conscious state.       JW:JXBJ  D:  06/10/2005 18:39:14  T:  06/10/2005 23:02:38  Job #:  [id number]

## 2011-05-01 NOTE — Discharge Summary (Signed)
Cody Perez, Cody Perez                           ACCOUNT NO.:  [id number]   MEDICAL RECORD NO.:  [id number]                   PATIENT TYPE:  INP   LOCATION:  5531                                 FACILITY:  [facility]   PHYSICIAN:  Rene Paci, M.D. [facility]          DATE OF BIRTH:  July 10, 1951   DATE OF ADMISSION:  02/26/2004  DATE OF DISCHARGE:  02/29/2004                                 DISCHARGE SUMMARY   DISCHARGE DIAGNOSES:  1. Alcohol abuse.  2. Hypokalemia.  3. Thrombocytopenia.  4. Alcoholic hepatitis.   BRIEF ADMISSION HISTORY:  [name] is a 60 year old white male who was  admitted for evaluation and treatment of alcohol abuse and for  detoxification.   PAST MEDICAL HISTORY:  Depression.   HOSPITAL COURSE:  PROBLEM #1 - GASTROINTESTINAL:  The patient presented with  elevated LFTs consistent with alcoholic hepatitis.  The patient was also  noted to have some thrombocytopenia.  The patient remained stable from a GI  standpoint; his LFTs did improve.   PROBLEM #2 - PSYCHIATRIC:  The patient's biggest problem is alcohol abuse.  The patient was seen in consultation by Dr. Antonietta Breach.  He was felt to  be undergoing acute alcohol withdrawal.  He made recommendations for a  Librium taper.  He also recommended starting Lexapro.  He also felt the  patient would benefit from some outpatient psychotherapy.  The patient was  tapered down on his Librium until the point he was stable for discharge.   LABORATORIES AT DISCHARGE:  Hemoglobin 13.8.  Coagulations were normal.  Platelet count was 90,000.  Potassium was 3.4.  AST was 332, ALT was 123,  total bilirubin was 1.5.  Urinalysis was negative.   Chest x-ray revealed COPD without any acute infiltrates and some mild  congestive heart failure.   MEDICATIONS AT DISCHARGE:  1. Aspirin 325 mg daily.  2. Atenolol/chlorthalidone 25/12.5 mg daily.  3. Multivitamin with minerals daily.  4. Thiamine 100 mg daily.  5. Folic acid 1 mg  daily.  6. Lexapro 10 mg daily.  7. Librium 50 mg q.6 h. for the first 24 hours, then decrease by 25 mg a day     until off and then additionally, Librium 50 mg 3 times a day as needed.   SPECIAL DISCHARGE INSTRUCTIONS:  No Klonopin and no Zocor.   FOLLOWUP:  Follow up with Dr. Tinnie Gens A. Tawanna Cooler, Monday, March 03, 2004, at 11  a.m. and follow up per drug and alcohol; phone number was given.      Cornell Barman, P.A. [facility]                  Rene Paci, M.D. [facility]    LC/MEDQ  D:  04/07/2004  T:  04/07/2004  Job:  [id number]   cc:   Tinnie Gens A. Tawanna Cooler, M.D. [facility]

## 2011-05-01 NOTE — H&P (Signed)
Cody Perez, Cody Perez                           ACCOUNT NO.:  [id number]   MEDICAL RECORD NO.:  [id number]                   PATIENT TYPE:  INP   LOCATION:  5531                                 FACILITY:  [facility]   PHYSICIAN:  Eugenio Hoes. Tawanna Cooler, M.D. [facility]           DATE OF BIRTH:  [birth year]   DATE OF ADMISSION:  02/26/2004  DATE OF DISCHARGE:                                HISTORY & PHYSICAL   This 60 year old married male was admitted to the hospital today for  evaluation of alcoholism, alcoholic hepatitis and hypokalemia.   The patient is a long term patient of mine for many years.  He is a Copywriter, advertising, teaches English, and a play write, teaches at [employer], who has struggled over the years with nicotine and alcohol abuse.  He stopped smokes six months ago when he came in for a physical and found he  had some underlying COPD.  Most recently, his alcohol intake has increased  and last week he went to cardiology because he did not feel well.  I was out  of town and his wife took him over there.  At that time, his alkaline  phosphatase came back 161, ALT 149, AST 613, potassium of 2.8.  Normal B12  and folate levels.  Triglycerides 3583, cholesterol 471.  The patient was  encouraged to get in touch with me for evaluation and follow-up.  He did not  do that, therefore today I called his wife, [name], and discussed with her,  explained I felt that he probably needed to be admitted for detox, correct  the hypokalemia and get him on the road to sobriety with psychiatric  evaluation and ongoing therapy by Dr. Rico Junker.  The patient was  subsequently admitted for that problem.   PAST MEDICAL HISTORY:  1. He was hospitalized for fractured arm and one time for depression.  2. Past illnesses and injuries:  None.   ALLERGIES:  No known drug allergies.   He quit smoking after 20+ years.  His alcohol consumption has been excessive  and it is difficult for him to  quantitate it.   REVIEW OF SYMPTOMS:  He gets his eyes checked every two years for glaucoma.  He wears glasses.  He gets regular dental care.  CARDIOPULMONARY:  Negative  except when I did his physical, I found a heart murmur.  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  He has never had any GI evaluation.  He has  never had a flexible sigmoidoscopy.  The rest of the review of systems is  negative.   SOCIAL HISTORY:  Married, lives here in [place], [place].  Two  children.  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   FAMILY HISTORY:  I do not know anything about his family history.  He was  adopted.  CURRENT MEDICATIONS:  1. Clonazepam 0.5 mg t.i.d.  2. Zocor 20 q.h.s.  3. Tenoretic 50/25 one half tablet daily.  4. Triamcinolone with Eucerin p.r.n.  5. One aspirin tablet a day.   PHYSICAL EXAMINATION:  GENERAL APPEARANCE:  He is a well-developed, muscular  male.  He use to be a runner who is somewhat disheveled but he is alert, he  is cooperative.  He is aware of his situation and willing to deal with it.  VITAL SIGNS:  Height 5 feet 11 inches, weight 172, blood pressure 140/90,  pulse 70 and regular, he is afebrile.  HEENT:  Negative.  NECK:  Supple.  Thyroid not enlarged.  No carotid bruits.  CHEST:  Clear to auscultation except for decreased breath sounds bilaterally  secondary to chronic tobacco abuse.  He is an ex-smoker.  CARDIOVASCULAR:  Negative except grade 2 to 3 systolic ejection murmur heard  best over the lower left sternal border consistent with MR.  ABDOMEN:  Negative except liver was enlarged 4 inches below the right costal  margin.  EXTREMITIES:  Normal.  SKIN:  Normal.  VASCULAR:  Peripheral pulses normal.   LABORATORY DATA:  As above.   IMPRESSION:  1. Alcoholism.  2. Alcoholic hepatitis.  3. Hypokalemia.  4. Hypertension.  5. Hyperlipidemia.  6. History of chronic obstructive pulmonary disease,  ex-smoker.  7. History of eczema.  8. History of mitral regurgitation.   PLAN:  Will admit, IV fluids, thiamine, multivitamins, Librium protocol for  possible withdrawal symptoms, psychiatric consult with Dr. Rico Junker as  soon as possible.                                                Jeffrey A. Tawanna Cooler, M.D. [facility]    JAT/MEDQ  D:  02/26/2004  T:  02/28/2004  Job:  [id number]   cc:   Rene Paci, M.D. [facility]  [address] [place], [place] [address]

## 2011-05-01 NOTE — H&P (Signed)
Cody Perez, Cody Perez NO.:  [id number]   MEDICAL RECORD NO.:  [id number]          PATIENT TYPE:  INP   LOCATION:  1621                         FACILITY:  [facility]   PHYSICIAN:  Corwin Levins, M.D. LHCDATE OF BIRTH:  04/22/1951   DATE OF ADMISSION:  06/06/2005  DATE OF DISCHARGE:                                HISTORY & PHYSICAL   CHIEF COMPLAINT:  Seizures at home, new onset.  No previous history.   HISTORY OF PRESENT ILLNESS:  Cody Perez is a 60 year old white male who was  sleeping downstairs the previous evening due to recent left ankle surgery  and ongoing discomfort.  He took his evening medications, no change in  dosing, although he admits he may have taken an extra Xanax or so.  He awoke  on his back, on the floor after walking outside for five minutes.  He went  inside the home, sat at his writing table in his chair, and was found by the  wife after 1:30 in the morning after she heard a noise and she walked in and  witnessed a tonic-clonic seizure while he was sitting in the chair.  He had  a third seizure, tonic-clonic, in the ambulance witnessed as well.  He  continued to be postictal thereafter.  Now he is alert and oriented,  rambling and somewhat confabulating.   PAST MEDICAL HISTORY:  1.  Alcoholism history.  2.  History of alcoholic hepatitis.  3.  COPD.  4.  Depression with anxiety.  5.  Hypercholesterolemia.  6.  Hypertension.  7.  History of eczema.  8.  History of right rib fracture.  9.  History of mitral regurgitation.  10. Question mitral prolapse.  11. History of thrombocytopenia.   PAST SURGICAL HISTORY:  Status post left ankle surgery with triple  arthrodesis in May 2006.   ALLERGIES:  No known drug allergies.   CURRENT MEDICATIONS:  1.  Zocor 20 mg p.o. daily.  2.  Vicodin p.r.n.  3.  Atenolol HCT questionable dose.  4.  Librium or Xanax or both p.r.n.  5.  Aspirin daily.  6.  Folic acid.  7.  Multivitamin.   SOCIAL  HISTORY:  He is a __________, married, lives in [place], no  tobacco, no alcohol for 18 months.  He went through detox, now in [facility]   FAMILY HISTORY:  Adopted.   REVIEW OF SYSTEMS:  Otherwise noncontributory.   PHYSICAL EXAMINATION:  GENERAL:  Cody Perez is a 60 year old white male.  VITAL SIGNS:  Blood pressure 106/70, respirations 16, heart rate 65,  afebrile.  HEENT:  Sclerae clear.  TMs clear.  Oropharynx benign.  NECK:  Without lymphadenopathy, JVD, thyromegaly.  CHEST:  No wheezes, rhonchi, or rales.  CARDIAC:  Regular rate and rhythm, no murmur.  ABDOMEN:  Soft, nontender, positive bowel sounds, no organomegaly or masses.  EXTREMITIES:  No edema.  Left ankle specifically with 2+ swelling and  erythema, persistent.  NEUROLOGIC:  Cranial nerves II through XII intact, otherwise nonfocal.  He  is mildly confused and rambling, questionable hypomanic.   ASSESSMENT  AND PLAN:  1.  Seizure, new-onset.  Admit for seizure precautions.  Check head MRI.      Continue p.o. Dilantin.  Check TSH, RPR.  Will need neurology      evaluation.  Questionable lumbar puncture.  2.  Psychiatric.  May need inpatient evaluation.  Continue home medications.      For now, consider Depakote for seizure and mood stabilization.  3.  Left ankle postoperative.  Seems to have more __________ than usual.      Check left ankle and foot films.  May need Orthopaedic evaluation.  4.  Other medical problems.  Continue all other medications.       JWJ/MEDQ  D:  06/06/2005  T:  06/06/2005  Job:  [id number]   cc:   Tinnie Gens A. Tawanna Cooler, M.D. [facility]

## 2011-05-01 NOTE — Consult Note (Signed)
Cody Perez, Cody Perez                 ACCOUNT NO.:  [id number]   MEDICAL RECORD NO.:  [id number]          PATIENT TYPE:  IPS   LOCATION:  0407                          FACILITY:  [facility]   PHYSICIAN:  Deanna Artis. Hickling, M.D.DATE OF BIRTH:  1951/07/10   DATE OF CONSULTATION:  07/06/2006  DATE OF DISCHARGE:  07/03/2006                                   CONSULTATION   CHIEF COMPLAINT:  Dementia.   HISTORY OF THE PRESENT CONDITION:  I was asked by Dr. 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  Dr. Electa Sniff had an opportunity to speak to the patient's wife, who  indicated to him increasing cognitive deficits including long- and short-  term memory what was of recent duration.   Indeed, the patient was seen at my office by my partner, Dr. Melvyn Novas, in April 2007 with complaints of foot deformity and possible  neuropathy.  The question that was raised was whether or not the high-arch  foot could represent some form of Friedreich's ataxia.  Dr. Oliva Bustard  evaluation entirely centered on the patient's motor system and led to a plan  to form a lumbosacral MRI scan to look for spinal stenosis.  EMGs performed  prior to this showed chronic bilateral L5 radiculopathy and L2-L3  radiculopathy suggesting spinal stenosis.  That was not found on MRI.  Indeed, the patient had significant scoliosis and some spondylosis without  nerve root or spinal cord compression.   The patient was seen a year ago at [facility] by my partner, Dr.  Avie Echevaria.  At that time, he had episodes of staring unresponsively with  tightening of his muscles, eyes rolled back, see-saw movements of his legs  which was thought to be a witnessed seizure.  He had three EEGs.  The first  suggested an electrographic seizure at the central vertex spreading to the  frontal regions.   The second was normal.  The third showed epileptiform  discharges emanating from the vertex.  The patient was transferred to [facility] for prolonged video telemetry EEG.  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  The  patient had been placed on valproic acid, then Keppra.  These medicines were  discontinued.   The patient was noted to have bipolar affective disorder with psychosis by  Dr. Antonietta Breach.  It appears that he was admitted to [facility] between August 6 and July 23, 2005 after having been at  [facility] June 27 through July 2.   Other medical history of note is a longstanding history of alcohol abuse  with alcoholic hepatitis, hospitalization for detoxification in March 2005,  use of narcotic medicine because of chronic pain secondary to a triple  arthrodesis of his left foot.  History of ECT being delivered to him when he  was in his 60s.   His current problems concern inability to  sleep, although looking at his  sleep log over the past two nights, he appears to have slept quite soundly.  This was not the case earlier in his hospitalization.  It is important to  note that he does not remember seeing his physician today, even though there  is a detailed note from him concerning further workup based on the MRI scan  that was recently carried out.   PAST MEDICAL HISTORY:  Significant for hypertension, alcoholism, mitral  regurgitation, alcoholic hepatitis, dyslipidemia, chronic obstructive  pulmonary disease, eczema.   PAST SURGICAL HISTORY:  Triple arthrodesis of his left foot.  MRI scan of  the brain one year ago in June 2006 showed evidence of atrophy and minimal  evidence of white matter disease.  There was some motion artifact.  MRI scan  carried out in July of this year again shows global atrophy.  Dr. Electa Sniff  was of the opinion that this was new, but I have looked at the  images in  detail and believe that there is no significant difference, other than  perhaps a few more white sub-centimeter subcortical white matter lesions  that were not present a year ago.  There is no evidence of stroke, of  dysmyelination, of further atrophy, of subdural hematomas, of hydrocephalus,  or any other significant change in white and gray matter.  In particular,  looking at the temporal lobes and the temporal horns, they do not seem to be  significantly different.   FAMILY HISTORY:  Negative for dementia, seizures, psychosis.   SOCIAL HISTORY:  The patient is married with two girls.  He has been a  Radio producer at [employer].  I think that he may have stepped  down.  He does not have a history of smoking.  He has been sober now for two  and a half years.  His primary physician is Dr. Alonza Smoker.   CURRENT MEDICATIONS:  1.  Clonazepam 1 mg twice daily.  2.  Zoloft 50 mg daily.  3.  Zocor 10 mg at 6 p.m.  4.  Tenormin 75 mg daily.  5.  Chlorthalidone 37.5 mg daily.  6.  Nicoderm patch placed here (that must mean he is a smoker).  7.  Ambien 10 mg at 9 p.m. as needed.  8.  Risperdal 2 mg at bedtime daily and 0.5 mg twice daily.  9.  He has other p.r.n. medications including Cepacol, Librium, and      ibuprofen.  I think thiamine was also added to his medicines today.   DRUG ALLERGIES:  Possible LITHIUM.   PHYSICAL EXAMINATION:  GENERAL:  This is a somewhat disheveled gentleman  with some stain on his pants.  He hobbles to me, lifting his left foot high  as part of a steppage gait.  He has a very high arch in that foot.  He says  that this is new.  VITAL SIGNS:  Temperature 98.5, blood pressure 106/75 standing and 110/76  sitting, resting pulse 107 sitting and 116 standing, respirations 16.  HEENT:  No signs of infection.  NECK:  Supple neck.  Full range of motion.  No bruits.  LUNGS:  Clear. HEART:  Systolic murmur at the left sternal border.  I did not  hear a mitral  click.  ABDOMEN:  Soft, nontender.  Bowel sounds are normal.  No hepatosplenomegaly.  EXTREMITIES:  Significant deformity of the left foot with high arch and a  hammertoe on the right foot.  His pulses  are normal.  His skin is  desquamating.  His nail beds are pink.   NEUROLOGIC EXAMINATION:  Mental status:  The patient scores 30/30 on the  Mini-Mental Status.  He was able to name 17 animals in a 1-minute period.  He had some difficulty with naming.  At times, he thinks very slowly.  There  are times that he contradicts himself.  There are times he clearly shows  poor memory, contradicting himself and unable to recall events that have  taken place today.  He thinks he is in the hospital for nine days, when he  has actually been in for six days.  Cranial nerves:  Round, reactive pupils, 3 mm to 2.5 mm.  Fundi normal.  Sharp disk margins.  Symmetric facial strength.  Midline tongue and uvula.  Air conduction greater than bone  conduction.  Motor examination:  The patient had excellent strength and good fine motor  movements with finger apposition.  No pronator drift.  Sensation:  Mild peripheral neuropathy, stocking and glove.  He had good  stereognosis, normal proprioception, and normal vibratory sensation.  Deep tendon reflexes:  Present and diminished at the biceps, brachial  radialis, triceps, and patellae.  They were absent at the ankles.  The  patient had bilateral flexor plantar responses.  Gait:  His gait shows a steppage gait on the left side.  It is slightly  broad-based, but he is steady on his feet.   IMPRESSION:  I suspect this patient has dementia.  He has poverty of thought  at times.  He has difficulty expressing himself.  He has problems with  memory of acute things that have happened today.  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   RECOMMENDATIONS:  EEG.  We need to make certain that there is not  significant slowing in the background.  We need to also make certain that  there are no focal deficits, although I would not expect that on the basis  of the MRI scan.  I agree with obtaining B12 and folic acid.  TSH is in the  normal range.   At present, I doubt that a lumbar puncture is going to be very helpful to  look for CreutzfeldGerilyn Pilgrim or AB42 protein and Tau protein.  I will discuss  this with my partners, Drs. Love and Dohmeier, who have seen the patient in  the past year.   I appreciate the opportunity to participate in his care.  If we are not able to find any specific treatable causes for dementia, he will need to start on  Aricept at a dose of 5 mg at nighttime and then increase to 10 mg at  nighttime.  It would also be acceptable to start on Exelon beginning at 3 mg  and increasing every week or two to a total of 12 mg per day.  I do not  think that the use of Namenda should be considered at this point, because he  does not have the appearance of a moderate dementia.  I appreciate the  opportunity to participate in his care.  He tells me that he has lost 20  pounds in the last few months.  I do not know if this is correct or not.  He  also told me that he is not sleeping at night, and the last two nights he  has slept well.  If you have any questions about this or if I can be of  assistance, do not hesitate to contact me.      Deanna Artis. Sharene Skeans, M.D.  Electronically Signed     WHH/MEDQ  D:  07/06/2006  T:  07/07/2006  Job:  [id number]   cc:   Tinnie Gens A. Tawanna Cooler, M.D. [facility]  [address] [place]  [place] [address]    Anselm Jungling, MD

## 2011-05-01 NOTE — Discharge Summary (Signed)
Cody Perez, Cody Perez NO.:  [id number]   MEDICAL RECORD NO.:  [id number]          PATIENT TYPE:  IPS   LOCATION:  0500                          FACILITY:  [facility]   PHYSICIAN:  Geoffery Lyons, M.D.      DATE OF BIRTH:  1951-03-03   DATE OF ADMISSION:  06/18/2005  DATE OF DISCHARGE:  06/22/2005                                 DISCHARGE SUMMARY   CHIEF COMPLAINT/HISTORY OF PRESENT ILLNESS:  This was the first admission to  [facility] for this 60 year old married white male  voluntarily admitted with a history of bipolar disorder initially seen at  [facility] for psychosis and seizures.  The patient reported mood swings,  __________ preoccupations.  He reports he was drinking daily.  He started at  11:00 in the morning in increased amounts.   PAST PSYCHIATRIC HISTORY:  First in [facility], admission for  detox and stress.  History of ECT when he was in his 50s.  History of being  detoxed prior.  He had been in [place].  College professor and  Magazine features editor.   ALCOHOL AND DRUG HISTORY:  Nonsmoker.  History of alcohol abuse.   MEDICAL HISTORY:  Hypertension.  Mitral regurgitation.  Hyperlipidemia.   MEDICATIONS:  1.  Atenolol 25 mg per day.  2.  Folate 1 mg daily.  3.  Thiamine 100 mg daily.  4.  Geodon 40 mg twice a day.  5.  Librium 50 mg q.4h. as needed.   PHYSICAL EXAMINATION:  Performed and failed to show any acute findings.   LABORATORY WORKUP:  CBC white blood cells 7.3, hemoglobin 14.4.  Blood  chemistries, glucose 130.  SGOT 36, SGPT 33.  CT scan failed to show any  acute findings.   MENTAL STATUS EXAM:  Revealed a fully alert male with fair eye contact.  Speech clear, tangential.  Mood anxious.  Affect anxious.  He did have a  sense of humor.  Thought processing coherent, logical and relevant.  Cognition well preserved.   ADMISSION DIAGNOSES:  AXIS I:  Bipolar disorder, manic.  Alcohol dependence.  AXIS II:  No  diagnosis.  AXIS III:  Hypertension.  Status post surgery for his foot.  AXIS IV:  Moderate.  AXIS V:  Upon admission 30, highest global assessment of function in the  last year 70-75.   COURSE IN HOSPITAL:  He was admitted.  He was started in individual and  group psychotherapy.  He was maintained on atenolol 25 mg per day, Folate 1  mg daily, thiamine 100 mg daily, Geodon 40 mg twice a day.  He was given  Librium 50 mg every 4 hours as needed for anxiety.  Initially he was placed  on one-to-one due to confusion.  Initially he was somewhat labile and was  exhibiting some disturbed behavior, confusion that was worse at night.  He  was out of bed.  He was standing bent over the toilet and held that position  for about 10 minutes.  Then he took his clothes off.  The next morning he  was pretty much aware of what was going on at night.  Endorsed he could not  help it.  He said he got into taking the clothes off.  Endorsed  hallucinations, but on the morning of June 19, 2005 he endorsed that he was  more clear and did remember what happened the day before.  He endorsed  occasional use of alcohol.  Admits that he was recently told he had bipolar  disorder.  He had been treated with ECT and Thorazine in the past.  The next  few days he showed marked improvement from day to day.  He was able to come  off one-to-one observation.  He became more alert.  He started having stable  nights, able to carry on a very intelligent conversation, talking about his  life but also some hyper-religious religiosity. We continued to work with  the [employer].  He had no side effects to it.  On July 9, he was much improved,  more focused, not __________, no evidence of hyper-religiosity, insightful,  able to stay on track.  On June 22, 2005, he was in full contact with  reality.  There were no suicidal ideas, no homicidal ideas, no  hallucinations, no delusions.  He was much improved.  He had tolerated the  medication  well.  We went ahead and discharged him to come to the IOP  intensive outpatient program for further stabilization.   DISCHARGE DIAGNOSES:  AXIS I:  Bipolar disorder, manic.  Alcohol abuse.  AXIS II:  No diagnosis.  AXIS III:  Hypertension.  Status post foot surgery.  AXIS IV:  Moderate.  AXIS V:  Global assessment of function upon discharge 60.   DISCHARGE MEDICATIONS:  1.  Tenormin 25 mg per day.  2.  Folic acid 1 mg daily.  3.  Vitamin B1 100 mg daily.  4.  Geodon 40 mg twice a day.  5.  Zocor 20 mg daily.  6.  Ambien 10 mg at night for sleep.   FOLLOW UP:  [facility] IOP.      Geoffery Lyons, M.D.  Electronically Signed     IL/MEDQ  D:  07/20/2005  T:  07/21/2005  Job:  [id number]

## 2011-05-01 NOTE — Procedures (Signed)
HISTORY:  This 60 year old gentleman who has had altered behavior, staring  spells, automatisms. The patient is being evaluated for possible seizures.  This is a routine EEG. No skull defects noted.   MEDICATIONS:  Aspirin, Atenolol, folic acid, multivitamins, Zocor, thiamine,  Triamterene, Vicodin.   EEG CLASSIFICATION:  Dysrhythmia grade 3, generalized.   DESCRIPTION OF PROCEDURE:  The background rhythm of this recording consists  of a fairly well modulated medium amplitude alpha rhythm of 8 to 9 hertz  that is reactive to eye opening and closure. As the record progresses, the  most notable feature of the recording is dysrhythmic activity emanating from  the vertex area. When followed, there appeared to be small spike wave  discharges emanating from the [facility] and CZ regions. At times, this dysrhythmic  activity generalizes in nature with more prominent occasional spike wave  discharges seen throughout. The record then returned to the more well  localized vertex activity as noted before. Photic stimulation and  hyperventilation were not performed. EKG monitor shows no evidence of  cardiac rhythm abnormalities with a heart rate of 102.   IMPRESSION:  This is an abnormal EEG recording due to epileptiform discharge  emanating from the vertex and then secondary generalizing. 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       ZOX:WRUE  D:  06/12/2005 17:41:05  T:  06/12/2005 20:21:18  Job #:  [id number]

## 2011-05-01 NOTE — Op Note (Signed)
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   PROCEDURE:  Left triple arthrodesis.   SURGEON:  Dr. Lajoyce Corners   ANESTHESIA:  General plus popliteal block.   ESTIMATED BLOOD LOSS:  Minimal.   ANTIBIOTICS:  1 gram of Kefzol.   TOURNIQUET TIME:  Esmarch of the ankle for approximately 60 minutes.   DISPOSITION:  To PACU in stable condition.   INDICATIONS FOR PROCEDURE:  The patient is a 60 year old gentleman with  posterior tibial tendon insufficiency of his left foot.  He has developed a  fixed pronated valgus foot, has pain with activities of daily living, and he  presents at this time for triple arthrodesis.  The risks and benefits were  discussed including infection, neurovascular injury, persistent pain,  nonhealing of the wound, need for additional surgery.  The patient states he  understands and wishes to proceed at this time.   DESCRIPTION OF PROCEDURE:  The patient is brought to OR room 4 and underwent  a general anesthetic after a popliteal block.  After adequate level of  anesthesia obtained, the patient's left lower extremity was prepped using  DuraPrep, draped into a sterile field, and Ioban was used to cover all  exposed skin.  The leg was elevated, and the Esmarch was wrapped around the  ankle for tourniquet control.  An oblique incision was made across the sinus  tarsi.  This was carried down; the subtalar joint was debrided.  The  posterior facet was also  debrided of cartilage.  Attention was also focused  on the calcaneocuboid joint, and the articular cartilage was removed from  the calcaneocuboid joint.  These joints were then irrigated with normal  saline.  There was petechial subchondral bleeding.  Attention was then  focused over the talonavicular joint.  A dorsal incision was made medial to  the anterior tibial tendon.  This was carried sharply down to bone, and  retractor was used to protect the neurovascular bundle.  The talonavicular  joint was debrided of articular cartilage.  The foot was then stabilized and  with the foot plantigrade of the ankle at 90 degrees, a compression screw  was placed across the subtalar joint from the anterior medial aspect of the  talus to the posterior lateral aspect of the calcaneus.  A 75-mm partially  threaded cannulated 7.3 screw was used.  This was advanced; C-arm  fluoroscopy verified reduction, and compression was obtained.  The subtalar  joint was packed with bone graft.  The calcaneocuboid joint was also packed  with bone graft,  and a 3.5 cortical 30-mm screw was then used to stabilize  the calcaneocuboid joint.  Attention was then focused on the talonavicular  joint.  Again, with the patient's foot in neutral varus valgus and  dorsiflexion of 90 degrees, a screw was placed across the talonavicular  joint.  Bone graft was also placed in this area of AlloMatrix 5 mL bone  graft with cancellus chips and demineralized bone matrix.  The subcu and  muscle fascia was then closed using a 2-0 Vicryl.  Skin was closed using a  far-near, near-far suture with 2-0 nylon.  The wound was covered with  Adaptic orthopedic sponges, sterile Webril, and a Coban dressing.  The  patient will be placed in a fracture boot, nonweightbearing on the left  side. Plan for discharge to home once stable with ambulation.      MVD/MEDQ  D:  04/16/2005  T:  04/16/2005  Job:  [id number]

## 2011-05-01 NOTE — Consult Note (Signed)
NAMERICHIE, BONANNO NO.:  [id number]   MEDICAL RECORD NO.:  [id number]          PATIENT TYPE:  INP   LOCATION:  1621                         [facility]AC[facility]  [facility]   PHYSICIAN:  Melvyn Novas, M.D.  DATE OF BIRTH:  01-Aug-1951   DATE OF CONSULTATION:  DATE OF DISCHARGE:                                   CONSULTATION   REASON FOR CONSULTATION:  New onset seizures.   Cody Perez is a 60 year old, Caucasian, right hand married male with a history  of alcohol and drug abuse, who has had also a history of continued nicotine  use.  Cody physical medical history includes a left foot fracture and  Achilles tendon rupture in childhood and multiple reinjuries to the left  calcaneus and ankle joint.  Psychiatric admissions were occurring twice in  the past, one time after a dissociative event and the first time after using  LSD and being on a bad trip.  The patient has a history of mania but  states that he is not bipolar, as does Cody Perez.  Cody family history is  unknown.  He was adopted.  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  The patient presented with a seizure last night  witnessed by Cody Perez in the home.  She describes a loud groaning or moaning  sound that did not appear to be of human origin, looked into the patient's  library and found him in an armchair in tonic clonic convulsions.  He had  Cody eyes wide open she states and had a distorted facial expression.  She  does describe alternating arm and leg movements.   MEDICATIONS:  1.  Zocor 20 mg once daily.  2.  Atenolol/hydrochlorothiazide 50/25, 1/2 tablet in the morning.  3.  Hydrocodone 5/500 p.r.n. for pain.  4.  __________ 25 mg every four hours but p.r.n. ordered.  He uses the last      two medications for the treatment of Cody foot pain.   The patient appears here logorrheic and confused.  He is almost in a  delirium.  Talks about Cody family history and has to be gently nudged back  to the subject of Cody current medical events.  Cody Perez stated he had at  least two or more nonconvulsive events where he seems to stare off, has no  verbal output and then resumes conversation but seems not to be aware of the  last five or four minutes.  Again, she described alternating leg and arm  movements only for the episode at home prior to arrival of the EMS.   MENTAL STATUS:  The patient is alert, has a slowed speech and appears  tangential in Cody thoughts and logorrheic.  CRANIAL NERVES:  Equal pupillary reaction to light and accommodation.  There  is no papilledema and full visual fields are seen on finger parametry.  Nystagmus to the right is noted, small beat.  FACIAL SYMMETRY:  Tongue, uvula midline.  Neck range of motion is normal.  There is no rigidity of the neck.  The  patient has no carotid bruits and has  regular respiratory rate of 16.  Heart rate 68.  He appears not in any  physical distress.  MOTOR:  Equal grip strength all four extremities lifted against gravity for  longer than 12 seconds.  The patient's left foot is swollen and hot to touch  and he stated that he had most recently six weeks ago another surgery.  He  is not aware of having any infection in the foot but dorsiflexion and  plantar flexion are weaker on the left than on the right.   ASSESSMENT:  The patient with new onset seizures that could be nonepileptic  in origin.  We will evaluate them for metabolic toxic reasons, infection, or  traumatic reasons.  An MRI of the brain is ordered with and without  gadolinium.  On Monday, we will obtain an EEG.  Today after the MRI is done,  I would like for the patient to  undergo fluoroscopic LP for cells, glucose, protein count, Gram stain,  cryptococcal antigen, herpes PCR.  I would like to place him on Depakote IV  1000 mg right away, then to continue on 500 mg which can be taken p.o.  b.i.d.  1.  I would like for the patient to be covered with acyclovir  until the      herpes PCR returns, hopefully negative.       CD/MEDQ  D:  06/06/2005  T:  06/07/2005  Job:  [id number]   cc:   Corwin Levins, M.D. [facility]   Titus Dubin. Alwyn Ren, M.D. [facility]

## 2011-06-26 ENCOUNTER — Emergency Department (HOSPITAL_COMMUNITY)
Admission: EM | Admit: 2011-06-26 | Discharge: 2011-06-26 | Disposition: A | Discharge status: Home / Self Care | Payer: BC Managed Care – PPO | Attending: Emergency Medicine | Admitting: Emergency Medicine

## 2011-06-26 ENCOUNTER — Emergency Department (HOSPITAL_COMMUNITY): Payer: BC Managed Care – PPO

## 2011-06-26 DIAGNOSIS — IMO0002 Reserved for concepts with insufficient information to code with codable children: Secondary | ICD-10-CM | POA: Insufficient documentation

## 2011-06-26 DIAGNOSIS — I10 Essential (primary) hypertension: Secondary | ICD-10-CM | POA: Insufficient documentation

## 2011-06-26 DIAGNOSIS — M25469 Effusion, unspecified knee: Secondary | ICD-10-CM | POA: Insufficient documentation

## 2011-06-26 DIAGNOSIS — S8990XA Unspecified injury of unspecified lower leg, initial encounter: Secondary | ICD-10-CM | POA: Insufficient documentation

## 2011-06-26 DIAGNOSIS — G40909 Epilepsy, unspecified, not intractable, without status epilepticus: Secondary | ICD-10-CM | POA: Insufficient documentation

## 2011-06-26 DIAGNOSIS — M25569 Pain in unspecified knee: Secondary | ICD-10-CM | POA: Insufficient documentation

## 2011-06-26 DIAGNOSIS — Y92009 Unspecified place in unspecified non-institutional (private) residence as the place of occurrence of the external cause: Secondary | ICD-10-CM | POA: Insufficient documentation

## 2011-06-26 DIAGNOSIS — Z79899 Other long term (current) drug therapy: Secondary | ICD-10-CM | POA: Insufficient documentation

## 2011-06-26 DIAGNOSIS — W108XXA Fall (on) (from) other stairs and steps, initial encounter: Secondary | ICD-10-CM | POA: Insufficient documentation

## 2011-06-26 DIAGNOSIS — Z7982 Long term (current) use of aspirin: Secondary | ICD-10-CM | POA: Insufficient documentation

## 2011-06-26 DIAGNOSIS — F319 Bipolar disorder, unspecified: Secondary | ICD-10-CM | POA: Insufficient documentation

## 2011-07-08 ENCOUNTER — Inpatient Hospital Stay (HOSPITAL_COMMUNITY): Payer: BC Managed Care – PPO

## 2011-07-08 ENCOUNTER — Ambulatory Visit (HOSPITAL_COMMUNITY)
Admission: RE | Admit: 2011-07-08 | Discharge: 2011-07-09 | Disposition: A | Discharge status: Home / Self Care | Payer: BC Managed Care – PPO | Source: Ambulatory Visit | Attending: Orthopedic Surgery | Admitting: Orthopedic Surgery

## 2011-07-08 DIAGNOSIS — F17200 Nicotine dependence, unspecified, uncomplicated: Secondary | ICD-10-CM | POA: Insufficient documentation

## 2011-07-08 DIAGNOSIS — F411 Generalized anxiety disorder: Secondary | ICD-10-CM | POA: Insufficient documentation

## 2011-07-08 DIAGNOSIS — W19XXXA Unspecified fall, initial encounter: Secondary | ICD-10-CM | POA: Insufficient documentation

## 2011-07-08 DIAGNOSIS — I059 Rheumatic mitral valve disease, unspecified: Secondary | ICD-10-CM | POA: Insufficient documentation

## 2011-07-08 DIAGNOSIS — I10 Essential (primary) hypertension: Secondary | ICD-10-CM | POA: Insufficient documentation

## 2011-07-08 DIAGNOSIS — S838X9A Sprain of other specified parts of unspecified knee, initial encounter: Principal | ICD-10-CM | POA: Insufficient documentation

## 2011-07-08 LAB — COMPREHENSIVE METABOLIC PANEL
ALT: 21 U/L (ref 0–53)
Alkaline Phosphatase: 112 U/L (ref 39–117)
CO2: 30 mEq/L (ref 19–32)
Creatinine, Ser: 0.95 mg/dL (ref 0.50–1.35)
GFR calc non Af Amer: >60 mL/min (ref >60)
Glucose, Bld: 108 mg/dL — ABNORMAL HIGH (ref 70–99)
Total Protein: 7.3 g/dL (ref 6.0–8.3)

## 2011-07-08 LAB — CBC
MCH: 35.2 pg — ABNORMAL HIGH (ref 26.0–34.0)
MCHC: 36.6 g/dL — ABNORMAL HIGH (ref 30.0–36.0)
Platelets: 283 10*3/uL (ref 150–400)
RBC: 4.01 MIL/uL — ABNORMAL LOW (ref 4.22–5.81)

## 2011-07-08 LAB — PROTIME-INR: Prothrombin Time: 13.3 seconds (ref 11.6–15.2)

## 2011-07-15 NOTE — Op Note (Signed)
  NAMEARYN, KOPS NO.:  [id number]  MEDICAL RECORD NO.:  [id number]  LOCATION:  2550                         FACILITY:  [facility]  PHYSICIAN:  Nadara Mustard, MD     DATE OF BIRTH:  [birth year]  DATE OF PROCEDURE:  07/08/2011 DATE OF DISCHARGE:                              OPERATIVE REPORT   PREOPERATIVE DIAGNOSIS:  Quad tendon rupture right knee.  POSTOPERATIVE DIAGNOSIS:  Quad tendon rupture right knee.  PROCEDURE:  Repair of right quad tendon.  SURGEON:  Nadara Mustard, MD  ANESTHESIA:  General.  ESTIMATED BLOOD LOSS:  Minimal.  ANTIBIOTICS:  2 g of Kefzol.  DRAINS:  None.  COMPLICATIONS:  None.  TOURNIQUET TIME:  None.  DISPOSITION:  PACU in stable condition.  INDICATIONS FOR PROCEDURE:  The patient is a 60 year old gentleman who fell sustaining an acute quad tendon rupture.  He was evaluated in the office and felt to be stable for surgical intervention and presents at this time for surgical reconstruction.  The patient had no active extension of his knee.  He is unable to walk or ambulate without his knee immobilizer.  Risks and benefits were discussed including infection, neurovascular injury, persistent pain, and need for additional surgery.  The patient states he understands and wished to proceed at this time.  DESCRIPTION OF PROCEDURE:  The patient was brought to OR room 15 and underwent a general anesthetic, after adequate level of anesthesia was obtained, the patient's right lower extremity was prepped using DuraPrep and draped into a sterile field.  A midline incision was made over the patella.  This was carried down to the quad tendon rupture which was a complete rupture.  The hematoma was evacuated from the joint.  This was irrigated with normal saline.  Using two #2 FiberWires were sutured through the proximal aspect of the quad tendon using a Krackow suture technique with the four strands exiting the distal aspect of the  quad tendon rupture.  Three drill holes were then made through the mid aspect of the patella, exiting distally.  Using a suture passer, the sutures were passed through the 3 tunnels, the quad tendon was reduced with the knee extended and were tied over the distal pole of the patella.  The patient had range of motion of the knee from 0 to 90 degrees without any failure of the repair.  The wound was again irrigated with normal saline.  Subcu was closed using 2- 0 Vicryl.  Skin was closed using approximate staples.  The wound was covered with Adaptic orthopedic sponges, ABD dressing, Kerlix and Coban. The patient was placed in a knee immobilizer, extubated, and taken to PACU in stable condition.     Nadara Mustard, MD     MVD/MEDQ  D:  07/08/2011  T:  07/08/2011  Job:  [id number]  Electronically Signed by Aldean Baker MD on 07/15/2011 06:28:05 AM

## 2011-08-19 ENCOUNTER — Telehealth: Payer: Self-pay | Admitting: Family Medicine

## 2011-08-19 NOTE — Telephone Encounter (Signed)
Opened in error

## 2011-10-08 ENCOUNTER — Other Ambulatory Visit: Payer: Self-pay | Admitting: Family Medicine

## 2012-02-03 ENCOUNTER — Other Ambulatory Visit: Payer: Self-pay | Admitting: Family Medicine

## 2012-02-23 ENCOUNTER — Other Ambulatory Visit: Payer: Self-pay | Admitting: Family Medicine

## 2012-05-11 ENCOUNTER — Other Ambulatory Visit: Payer: Self-pay | Admitting: Family Medicine

## 2012-08-07 ENCOUNTER — Other Ambulatory Visit: Payer: Self-pay | Admitting: Family Medicine

## 2012-10-17 ENCOUNTER — Other Ambulatory Visit (INDEPENDENT_AMBULATORY_CARE_PROVIDER_SITE_OTHER): Payer: BC Managed Care – PPO

## 2012-10-17 DIAGNOSIS — Z0000 Encounter for general adult medical examination without abnormal findings: Secondary | ICD-10-CM

## 2012-10-17 LAB — LIPID PANEL
Cholesterol: 241 mg/dL — ABNORMAL HIGH (ref 0–200)
HDL: 64.6 mg/dL (ref >39.00)
VLDL: 38.2 mg/dL (ref 0.0–40.0)

## 2012-10-17 LAB — BASIC METABOLIC PANEL
GFR: 78.28 mL/min (ref >60.00)
Potassium: 5.2 mEq/L — ABNORMAL HIGH (ref 3.5–5.1)
Sodium: 138 mEq/L (ref 135–145)

## 2012-10-17 LAB — CBC WITH DIFFERENTIAL/PLATELET
Basophils Absolute: 0 10*3/uL (ref 0.0–0.1)
Eosinophils Absolute: 0 10*3/uL (ref 0.0–0.7)
MCHC: 33.7 g/dL (ref 30.0–36.0)
MCV: 102.2 fl — ABNORMAL HIGH (ref 78.0–100.0)
Monocytes Absolute: 0.6 10*3/uL (ref 0.1–1.0)
Neutrophils Relative %: 51.5 % (ref 43.0–77.0)
Platelets: 189 10*3/uL (ref 150.0–400.0)
RDW: 13.9 % (ref 11.5–14.6)
WBC: 7.2 10*3/uL (ref 4.5–10.5)

## 2012-10-17 LAB — HEPATIC FUNCTION PANEL
ALT: 24 U/L (ref 0–53)
AST: 36 U/L (ref 0–37)
Albumin: 4 g/dL (ref 3.5–5.2)
Alkaline Phosphatase: 93 U/L (ref 39–117)
Total Bilirubin: 0.6 mg/dL (ref 0.3–1.2)

## 2012-10-17 LAB — TSH: TSH: 0.96 u[IU]/mL (ref 0.35–5.50)

## 2012-10-17 LAB — POCT URINALYSIS DIPSTICK
Blood, UA: NEGATIVE
Ketones, UA: NEGATIVE
Protein, UA: NEGATIVE
Spec Grav, UA: <=1.005
Urobilinogen, UA: 0.2

## 2012-10-24 ENCOUNTER — Encounter: Payer: Self-pay | Admitting: Family Medicine

## 2012-10-24 ENCOUNTER — Ambulatory Visit (INDEPENDENT_AMBULATORY_CARE_PROVIDER_SITE_OTHER): Payer: BC Managed Care – PPO | Admitting: Family Medicine

## 2012-10-24 VITALS — BP 110/84 | Temp 98.5°F | Ht 68.5 in | Wt 204.0 lb

## 2012-10-24 DIAGNOSIS — Z23 Encounter for immunization: Secondary | ICD-10-CM

## 2012-10-24 DIAGNOSIS — F068 Other specified mental disorders due to known physiological condition: Secondary | ICD-10-CM

## 2012-10-24 DIAGNOSIS — M216X9 Other acquired deformities of unspecified foot: Secondary | ICD-10-CM

## 2012-10-24 DIAGNOSIS — I080 Rheumatic disorders of both mitral and aortic valves: Secondary | ICD-10-CM

## 2012-10-24 DIAGNOSIS — G609 Hereditary and idiopathic neuropathy, unspecified: Secondary | ICD-10-CM

## 2012-10-24 DIAGNOSIS — F411 Generalized anxiety disorder: Secondary | ICD-10-CM

## 2012-10-24 DIAGNOSIS — F3289 Other specified depressive episodes: Secondary | ICD-10-CM

## 2012-10-24 DIAGNOSIS — E785 Hyperlipidemia, unspecified: Secondary | ICD-10-CM

## 2012-10-24 DIAGNOSIS — F329 Major depressive disorder, single episode, unspecified: Secondary | ICD-10-CM

## 2012-10-24 MED ORDER — SIMVASTATIN 10 MG PO TABS: 10.0000 mg | ORAL_TABLET | Freq: Every day | ORAL | Status: DC

## 2012-10-24 NOTE — Patient Instructions (Signed)
Restart the simvastatin 10 mg daily at bedtime with an aspirin tablet  Do not restart the Tenoretic,,,,,,,,,,, your blood pressure has normalized  Continue the medications and followup with Dr. Nolen Mu  If you would like further evaluation of your foot I would recommend Dr. Toni Arthurs  Return in one year sooner if any problems

## 2012-10-24 NOTE — Progress Notes (Signed)
  Subjective:    Patient ID: Cody Perez, male    DOB: 1951-10-01, 61 y.o.   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 Nolen Mu his psychiatrist  He gets routine eye care, dental care, colonoscopy in his 84s which is normal, tetanus 2010, seasonal flu shot today, information given on shingles    Review of Systems  Constitutional: Negative.   HENT: Negative.   Eyes: Negative.   Respiratory: Negative.   Cardiovascular: Negative.   Gastrointestinal: Negative.   Genitourinary: Negative.   Musculoskeletal: Negative.   Skin: Negative.   Neurological: Negative.   Hematological: Negative.   Psychiatric/Behavioral: Negative.        Objective:   Physical Exam  Constitutional: He is oriented to person, place, and time. He appears well-developed and well-nourished.  HENT:  Head: Normocephalic and atraumatic.  Right Ear: External ear normal.  Left Ear: External ear normal.  Nose: Nose normal.  Mouth/Throat: Oropharynx is clear and moist.  Eyes: Conjunctivae normal and EOM are normal. Pupils are equal, round, and reactive to light.  Neck: Normal range of motion. Neck supple. No JVD present. No tracheal deviation present. No thyromegaly present.  Cardiovascular: Normal rate, regular rhythm, normal heart sounds and intact distal pulses.  Exam reveals no gallop and no friction rub.   No murmur heard.      No carotid bruits  Pulmonary/Chest: Effort normal and breath sounds normal. No stridor. No respiratory distress. He has no wheezes. He has no rales. He  exhibits no tenderness.  Abdominal: Soft. Bowel sounds are normal. He exhibits no distension and no mass. There is no tenderness. There is no rebound and no guarding.  Genitourinary: Rectum normal, prostate normal and penis normal. Guaiac negative stool. No penile tenderness.  Musculoskeletal: Normal range of motion. He exhibits no edema and no tenderness.       Severe deformity left foot  Lymphadenopathy:    He has no cervical adenopathy.  Neurological: He is alert and oriented to person, place, and time. He has normal reflexes. No cranial nerve deficit. He exhibits normal muscle tone.  Skin: Skin is warm and dry. No rash noted. No erythema. No pallor.  Psychiatric: He has a normal mood and affect. His behavior is normal. Judgment and thought content normal.   scar right lower quadriceps from previous surgery last year he had a torn quadriceps require surgical repair        Assessment & Plan:  Healthy male  Bipolar depression continue followup by Dr. Nolen Mu  Hypertension off medication for 2 months blood pressure normal saline medication  Hyperlipidemia restart Zocor 10 mg daily  Deformity left foot referred to Dr. Toni Arthurs for consult

## 2013-10-13 ENCOUNTER — Other Ambulatory Visit: Payer: Self-pay | Admitting: Family Medicine

## 2013-11-23 ENCOUNTER — Other Ambulatory Visit (INDEPENDENT_AMBULATORY_CARE_PROVIDER_SITE_OTHER): Payer: BC Managed Care – PPO

## 2013-11-23 DIAGNOSIS — Z0000 Encounter for general adult medical examination without abnormal findings: Secondary | ICD-10-CM

## 2013-11-23 LAB — CBC WITH DIFFERENTIAL/PLATELET
Eosinophils Relative: 0.3 % (ref 0.0–5.0)
HCT: 44.3 % (ref 39.0–52.0)
Lymphocytes Relative: 29.9 % (ref 12.0–46.0)
Lymphs Abs: 2 10*3/uL (ref 0.7–4.0)
Monocytes Relative: 8 % (ref 3.0–12.0)
Platelets: 189 10*3/uL (ref 150.0–400.0)
WBC: 6.8 10*3/uL (ref 4.5–10.5)

## 2013-11-23 LAB — POCT URINALYSIS DIPSTICK
Bilirubin, UA: NEGATIVE
Blood, UA: NEGATIVE
Glucose, UA: NEGATIVE
Ketones, UA: NEGATIVE
Leukocytes, UA: NEGATIVE
Nitrite, UA: NEGATIVE
Protein, UA: NEGATIVE
Spec Grav, UA: <=1.005
Urobilinogen, UA: 0.2
pH, UA: 6.5

## 2013-11-23 LAB — HEPATIC FUNCTION PANEL
ALT: 23 U/L (ref 0–53)
AST: 34 U/L (ref 0–37)
Albumin: 4.4 g/dL (ref 3.5–5.2)
Alkaline Phosphatase: 89 U/L (ref 39–117)
Bilirubin, Direct: 0.2 mg/dL (ref 0.0–0.3)
Total Bilirubin: 0.7 mg/dL (ref 0.3–1.2)
Total Protein: 7.6 g/dL (ref 6.0–8.3)

## 2013-11-23 LAB — BASIC METABOLIC PANEL
BUN: 11 mg/dL (ref 6–23)
Calcium: 9.9 mg/dL (ref 8.4–10.5)
GFR: 70.75 mL/min (ref >60.00)
Potassium: 5.3 mEq/L — ABNORMAL HIGH (ref 3.5–5.1)
Sodium: 138 mEq/L (ref 135–145)

## 2013-11-23 LAB — PSA: PSA: 1.52 ng/mL (ref 0.10–4.00)

## 2013-11-23 LAB — LIPID PANEL
Cholesterol: 168 mg/dL (ref 0–200)
HDL: 78.4 mg/dL (ref >39.00)
LDL Cholesterol: 79 mg/dL (ref 0–99)
Total CHOL/HDL Ratio: 2
Triglycerides: 54 mg/dL (ref 0.0–149.0)
VLDL: 10.8 mg/dL (ref 0.0–40.0)

## 2013-11-23 LAB — TSH: TSH: 0.95 u[IU]/mL (ref 0.35–5.50)

## 2013-11-30 ENCOUNTER — Ambulatory Visit (INDEPENDENT_AMBULATORY_CARE_PROVIDER_SITE_OTHER): Payer: BC Managed Care – PPO | Admitting: Family Medicine

## 2013-11-30 ENCOUNTER — Encounter: Payer: Self-pay | Admitting: Family Medicine

## 2013-11-30 VITALS — BP 140/90 | Temp 98.7°F | Ht 68.25 in | Wt 200.0 lb

## 2013-11-30 DIAGNOSIS — J449 Chronic obstructive pulmonary disease, unspecified: Secondary | ICD-10-CM

## 2013-11-30 DIAGNOSIS — B351 Tinea unguium: Secondary | ICD-10-CM | POA: Insufficient documentation

## 2013-11-30 DIAGNOSIS — F411 Generalized anxiety disorder: Secondary | ICD-10-CM

## 2013-11-30 DIAGNOSIS — Z23 Encounter for immunization: Secondary | ICD-10-CM

## 2013-11-30 DIAGNOSIS — M216X9 Other acquired deformities of unspecified foot: Secondary | ICD-10-CM

## 2013-11-30 DIAGNOSIS — F329 Major depressive disorder, single episode, unspecified: Secondary | ICD-10-CM

## 2013-11-30 DIAGNOSIS — I10 Essential (primary) hypertension: Secondary | ICD-10-CM

## 2013-11-30 DIAGNOSIS — I080 Rheumatic disorders of both mitral and aortic valves: Secondary | ICD-10-CM

## 2013-11-30 DIAGNOSIS — G609 Hereditary and idiopathic neuropathy, unspecified: Secondary | ICD-10-CM

## 2013-11-30 DIAGNOSIS — E785 Hyperlipidemia, unspecified: Secondary | ICD-10-CM

## 2013-11-30 DIAGNOSIS — F068 Other specified mental disorders due to known physiological condition: Secondary | ICD-10-CM

## 2013-11-30 MED ORDER — SIMVASTATIN 10 MG PO TABS: ORAL_TABLET | ORAL | Status: DC

## 2013-11-30 MED ORDER — DONEPEZIL HCL 10 MG PO TABS: ORAL_TABLET | ORAL | Status: DC

## 2013-11-30 NOTE — Patient Instructions (Signed)
Continue your current medications  Soak and file your nails weekly starting tonight  Return in one year for general physical exam sooner if any problem

## 2013-11-30 NOTE — Progress Notes (Signed)
Pre visit review using our clinic review tool, if applicable. No additional management support is needed unless otherwise documented below in the visit note. 

## 2013-11-30 NOTE — Progress Notes (Signed)
   Subjective:    Patient ID: Cody Perez, male    DOB: 04-16-1951, 62 y.o.   MRN: [id number]  HPI Cody Perez is a 62 year old male who comes in today for general physical examination because of a history of bipolar depression, hyperlipidemia, mild memory loss, COPD, and onychomycosis of all his toenails  Cognitive function fairly normal he is stable on Aricept 10 mg daily  He is stable emotionally he is on Abilify, Klonopin, Lamictal, and respired all by Dr. Nolen Mu his psychiatrist  He takes simvastatin 10 mg for hyperlipidemia  He's not able to walk because of his foot deformity on his left foot. He's had previous surgery to that foot. He also has fungal infection and all 10 toenails. They're thick and enlarged.  Vaccinations up-to-date  He gets routine eye care, dental care, colonoscopy and GI   Review of Systems  Constitutional: Negative.   HENT: Negative.   Eyes: Negative.   Respiratory: Negative.   Cardiovascular: Negative.   Gastrointestinal: Negative.   Endocrine: Negative.   Genitourinary: Negative.   Musculoskeletal: Negative.   Skin: Negative.   Allergic/Immunologic: Negative.   Neurological: Negative.   Hematological: Negative.   Psychiatric/Behavioral: Negative.        Objective:   Physical Exam  Nursing note and vitals reviewed. Constitutional: He is oriented to person, place, and time. He appears well-developed and well-nourished.  HENT:  Head: Normocephalic and atraumatic.  Right Ear: External ear normal.  Left Ear: External ear normal.  Nose: Nose normal.  Mouth/Throat: Oropharynx is clear and moist.  Eyes: Conjunctivae and EOM are normal. Pupils are equal, round, and reactive to light.  Neck: Normal range of motion. Neck supple. No JVD present. No tracheal deviation present. No thyromegaly present.  Cardiovascular: Normal rate, regular rhythm, normal heart sounds and intact distal pulses.  Exam reveals no gallop and no friction rub.   No murmur  heard. No carotid aortic bruits  Pulmonary/Chest: Effort normal and breath sounds normal. No stridor. No respiratory distress. He has no wheezes. He has no rales. He exhibits no tenderness.  Abdominal: Soft. Bowel sounds are normal. He exhibits no distension and no mass. There is no tenderness. There is no rebound and no guarding.  Genitourinary: Rectum normal, prostate normal and penis normal. Guaiac negative stool. No penile tenderness.  Musculoskeletal: Normal range of motion. He exhibits no edema and no tenderness.  Lymphadenopathy:    He has no cervical adenopathy.  Neurological: He is alert and oriented to person, place, and time. He has normal reflexes. No cranial nerve deficit. He exhibits normal muscle tone.  Skin: Skin is warm and dry. No rash noted. No erythema. No pallor.  Total body skin exam normal except for the fungal infection and all the toenails. All 10 toenails were trimmed by me  Psychiatric: He has a normal mood and affect. His behavior is normal. Judgment and thought content normal.          Assessment & Plan:  History of bipolar depression continue current meds follow by Dr. 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

## 2014-12-12 ENCOUNTER — Other Ambulatory Visit (INDEPENDENT_AMBULATORY_CARE_PROVIDER_SITE_OTHER): Payer: BC Managed Care – PPO

## 2014-12-12 DIAGNOSIS — Z0000 Encounter for general adult medical examination without abnormal findings: Secondary | ICD-10-CM

## 2014-12-12 LAB — CBC WITH DIFFERENTIAL/PLATELET
BASOS ABS: 0 10*3/uL (ref 0.0–0.1)
Basophils Relative: 0.5 % (ref 0.0–3.0)
Eosinophils Absolute: 0.1 10*3/uL (ref 0.0–0.7)
Eosinophils Relative: 1.8 % (ref 0.0–5.0)
HCT: 44.4 % (ref 39.0–52.0)
HEMOGLOBIN: 15.1 g/dL (ref 13.0–17.0)
LYMPHS PCT: 36.5 % (ref 12.0–46.0)
Lymphs Abs: 2.4 10*3/uL (ref 0.7–4.0)
MCHC: 33.9 g/dL (ref 30.0–36.0)
MCV: 102.1 fl — ABNORMAL HIGH (ref 78.0–100.0)
MONOS PCT: 8.7 % (ref 3.0–12.0)
Monocytes Absolute: 0.6 10*3/uL (ref 0.1–1.0)
NEUTROS PCT: 52.5 % (ref 43.0–77.0)
Neutro Abs: 3.4 10*3/uL (ref 1.4–7.7)
Platelets: 190 10*3/uL (ref 150.0–400.0)
RBC: 4.35 Mil/uL (ref 4.22–5.81)
RDW: 13.7 % (ref 11.5–15.5)
WBC: 6.5 10*3/uL (ref 4.0–10.5)

## 2014-12-12 LAB — HEPATIC FUNCTION PANEL
ALK PHOS: 84 U/L (ref 39–117)
ALT: 21 U/L (ref 0–53)
AST: 36 U/L (ref 0–37)
Albumin: 3.9 g/dL (ref 3.5–5.2)
BILIRUBIN TOTAL: 0.5 mg/dL (ref 0.2–1.2)
Bilirubin, Direct: 0.2 mg/dL (ref 0.0–0.3)
Total Protein: 7 g/dL (ref 6.0–8.3)

## 2014-12-12 LAB — BASIC METABOLIC PANEL
BUN: 7 mg/dL (ref 6–23)
CO2: 27 mEq/L (ref 19–32)
Calcium: 9.5 mg/dL (ref 8.4–10.5)
Chloride: 100 mEq/L (ref 96–112)
Creatinine, Ser: 0.9 mg/dL (ref 0.4–1.5)
GFR: 85.43 mL/min (ref >60.00)
Glucose, Bld: 107 mg/dL — ABNORMAL HIGH (ref 70–99)
Potassium: 5.2 mEq/L — ABNORMAL HIGH (ref 3.5–5.1)
SODIUM: 137 meq/L (ref 135–145)

## 2014-12-12 LAB — POCT URINALYSIS DIPSTICK
Bilirubin, UA: NEGATIVE
Blood, UA: NEGATIVE
Glucose, UA: NEGATIVE
KETONES UA: NEGATIVE
Leukocytes, UA: NEGATIVE
NITRITE UA: NEGATIVE
Spec Grav, UA: 1.015
UROBILINOGEN UA: 0.2
pH, UA: 6

## 2014-12-12 LAB — LIPID PANEL
Cholesterol: 170 mg/dL (ref 0–200)
HDL: 79.1 mg/dL (ref >39.00)
LDL Cholesterol: 75 mg/dL (ref 0–99)
NONHDL: 90.9
Total CHOL/HDL Ratio: 2
Triglycerides: 79 mg/dL (ref 0.0–149.0)
VLDL: 15.8 mg/dL (ref 0.0–40.0)

## 2014-12-12 LAB — PSA: PSA: 1.07 ng/mL (ref 0.10–4.00)

## 2014-12-12 LAB — TSH: TSH: 1.09 u[IU]/mL (ref 0.35–4.50)

## 2014-12-20 ENCOUNTER — Encounter: Payer: Self-pay | Admitting: Family Medicine

## 2014-12-20 ENCOUNTER — Ambulatory Visit (INDEPENDENT_AMBULATORY_CARE_PROVIDER_SITE_OTHER): Payer: BLUE CROSS/BLUE SHIELD | Admitting: Family Medicine

## 2014-12-20 VITALS — BP 140/90 | Temp 97.5°F | Ht 68.5 in | Wt 194.0 lb

## 2014-12-20 DIAGNOSIS — E785 Hyperlipidemia, unspecified: Secondary | ICD-10-CM

## 2014-12-20 DIAGNOSIS — I080 Rheumatic disorders of both mitral and aortic valves: Secondary | ICD-10-CM

## 2014-12-20 DIAGNOSIS — Z23 Encounter for immunization: Secondary | ICD-10-CM

## 2014-12-20 MED ORDER — SIMVASTATIN 10 MG PO TABS: ORAL_TABLET | ORAL | Status: DC

## 2014-12-20 NOTE — Progress Notes (Signed)
Subjective:    Patient ID: Cody Perez, male    DOB: [birth year], 64 y.o.   MRN: [id number]  HPI [name] is a 64 year old married male ex-smoker who comes in today for general physical examination  He is on numerous psychiatric medications from Dr. Nolen Mu because of a history of bipolar depression. He's on Abilify, Klonopin, Lamictal, Seroquel, Risperdal. He also takes 10 mg of Aricept daily because of early dementia. We gave him Zocor 10 mg daily for hyperlipidemia  He is an ex-smoker and has a history of underlying COPD. Initially declined vaccinations now agrees  He gets routine eye care, dental care, colonoscopy 2008 normal. Vaccinations updated by Fleet Contras.  He had surgery on his right quad by Dr. Lajoyce Corners and has done well. He has marked deformity of both feet congenital no surgery anticipated at this time.  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   HENT: Negative.   Eyes: Negative.   Respiratory: Negative.   Cardiovascular: Negative.   Gastrointestinal: Negative.   Endocrine: Negative.   Genitourinary: Negative.   Musculoskeletal: Negative.   Skin: Negative.   Allergic/Immunologic: Negative.   Neurological: Negative.   Hematological: Negative.   Psychiatric/Behavioral: Negative.        Objective:   Physical Exam  Constitutional: He is oriented to person, place, and time. He appears well-developed and well-nourished.  HENT:  Head: Normocephalic and atraumatic.  Right Ear: External ear normal.  Left Ear: External ear normal.  Nose: Nose normal.  Mouth/Throat: Oropharynx is clear and moist.  Eyes: Conjunctivae and EOM are normal. Pupils are equal, round, and reactive to light.  Neck: Normal range of motion. Neck supple. No JVD present.  No tracheal deviation present. No thyromegaly present.  Cardiovascular: Normal rate, regular rhythm and intact distal pulses.  Exam reveals no gallop and no friction rub.   Murmur heard. He has a history of mitral regurg. He said cardiac evaluation the past. Murmur is unchanged therefore no studies at this time indicated  Pulmonary/Chest: Effort normal and breath sounds normal. No stridor. No respiratory distress. He has no wheezes. He has no rales. He exhibits no tenderness.  Abdominal: Soft. Bowel sounds are normal. He exhibits no distension and no mass. There is no tenderness. There is no rebound and no guarding.  Genitourinary: Rectum normal, prostate normal and penis normal. Guaiac negative stool. No penile tenderness.  Musculoskeletal: Normal range of motion. He exhibits no edema or tenderness.  Scar right knee from previous surgery and marked deformity of all his toes  Lymphadenopathy:    He has no cervical adenopathy.  Neurological: He is alert and oriented to person, place, and time. He has normal reflexes. No cranial nerve deficit. He exhibits normal muscle tone.  Skin: Skin is warm and dry. No rash noted. No erythema. No pallor.  Total body skin exam normal  Psychiatric: He has a normal mood and affect. His behavior is normal. Judgment and thought content normal.  Nursing note and vitals reviewed.         Assessment & Plan:  Hyperlipidemia.....Marland Kitchen continue Zocor  History of bipolar depression ....... continue medicines and follow-up by Dr. Nolen Mu  History of early dementia ........ continue Aricept  Mitral regurg ...Marland KitchenMarland KitchenMarland Kitchen murmur unchanged ........ 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 no treatment at this time he's followed by orthopedics .........Marland Kitchen

## 2014-12-20 NOTE — Patient Instructions (Addendum)
Continue your current medications  Follow-up in one year sooner if any problems  Call your insurance company about the shingles vaccine

## 2014-12-20 NOTE — Progress Notes (Signed)
Pre visit review using our clinic review tool, if applicable. No additional management support is needed unless otherwise documented below in the visit note. 

## 2015-01-09 ENCOUNTER — Other Ambulatory Visit: Payer: Self-pay | Admitting: Family Medicine

## 2016-02-19 ENCOUNTER — Other Ambulatory Visit: Payer: Self-pay | Admitting: Family Medicine

## 2016-04-03 ENCOUNTER — Encounter: Payer: Self-pay | Admitting: Adult Health

## 2016-04-03 ENCOUNTER — Ambulatory Visit (INDEPENDENT_AMBULATORY_CARE_PROVIDER_SITE_OTHER): Payer: BLUE CROSS/BLUE SHIELD | Admitting: Adult Health

## 2016-04-03 VITALS — BP 170/100 | HR 108 | Temp 97.9°F | Wt 196.9 lb

## 2016-04-03 DIAGNOSIS — I10 Essential (primary) hypertension: Secondary | ICD-10-CM

## 2016-04-03 LAB — BASIC METABOLIC PANEL
BUN: 8 mg/dL (ref 6–23)
CO2: 28 meq/L (ref 19–32)
Calcium: 9.3 mg/dL (ref 8.4–10.5)
Chloride: 99 mEq/L (ref 96–112)
Creatinine, Ser: 1.01 mg/dL (ref 0.40–1.50)
GFR: 78.32 mL/min (ref >60.00)
GLUCOSE: 112 mg/dL — AB (ref 70–99)
POTASSIUM: 4.8 meq/L (ref 3.5–5.1)
SODIUM: 134 meq/L — AB (ref 135–145)

## 2016-04-03 LAB — CBC WITH DIFFERENTIAL/PLATELET
Basophils Absolute: 0 10*3/uL (ref 0.0–0.1)
Basophils Relative: 0.6 % (ref 0.0–3.0)
EOS PCT: 0.2 % (ref 0.0–5.0)
Eosinophils Absolute: 0 10*3/uL (ref 0.0–0.7)
HEMATOCRIT: 42.3 % (ref 39.0–52.0)
Hemoglobin: 14.6 g/dL (ref 13.0–17.0)
LYMPHS ABS: 1.7 10*3/uL (ref 0.7–4.0)
Lymphocytes Relative: 21.4 % (ref 12.0–46.0)
MCHC: 34.6 g/dL (ref 30.0–36.0)
MCV: 99.9 fl (ref 78.0–100.0)
MONOS PCT: 7.1 % (ref 3.0–12.0)
Monocytes Absolute: 0.6 10*3/uL (ref 0.1–1.0)
NEUTROS PCT: 70.7 % (ref 43.0–77.0)
Neutro Abs: 5.6 10*3/uL (ref 1.4–7.7)
Platelets: 184 10*3/uL (ref 150.0–400.0)
RBC: 4.23 Mil/uL (ref 4.22–5.81)
RDW: 13.8 % (ref 11.5–15.5)
WBC: 8 10*3/uL (ref 4.0–10.5)

## 2016-04-03 MED ORDER — ATENOLOL-CHLORTHALIDONE 50-25 MG PO TABS: 1.0000 | ORAL_TABLET | Freq: Every day | ORAL | Status: DC

## 2016-04-03 NOTE — Progress Notes (Signed)
Pre visit review using our clinic review tool, if applicable. No additional management support is needed unless otherwise documented below in the visit note. 

## 2016-04-03 NOTE — Patient Instructions (Addendum)
It was great meeting you today  I will follow up with you regarding your blood work and then we will restart you on a blood pressure medication.   Watch your salt intake.   Drink plenty of water  Follow up in one week.    Health Maintenance, Male A healthy lifestyle and preventative care can promote health and wellness.  Maintain regular health, dental, and eye exams.  Eat a healthy diet. Foods like vegetables, fruits, whole grains, low-fat dairy products, and lean protein foods contain the nutrients you need and are low in calories. Decrease your intake of foods high in solid fats, added sugars, and salt. Get information about a proper diet from your health care provider, if necessary.  Regular physical exercise is one of the most important things you can do for your health. Most adults should get at least 150 minutes of moderate-intensity exercise (any activity that increases your heart rate and causes you to sweat) each week. In addition, most adults need muscle-strengthening exercises on 2 or more days a week.   Maintain a healthy weight. The body mass index (BMI) is a screening tool to identify possible weight problems. It provides an estimate of body fat based on height and weight. Your health care provider can find your BMI and can help you achieve or maintain a healthy weight. For males 20 years and older:  A BMI below 18.5 is considered underweight.  A BMI of 18.5 to 24.9 is normal.  A BMI of 25 to 29.9 is considered overweight.  A BMI of 30 and above is considered obese.  Maintain normal blood lipids and cholesterol by exercising and minimizing your intake of saturated fat. Eat a balanced diet with plenty of fruits and vegetables. Blood tests for lipids and cholesterol should begin at age 67 and be repeated every 5 years. If your lipid or cholesterol levels are high, you are over age 67, or you are at high risk for heart disease, you may need your cholesterol levels checked  more frequently.Ongoing high lipid and cholesterol levels should be treated with medicines if diet and exercise are not working.  If you smoke, find out from your health care provider how to quit. If you do not use tobacco, do not start.  Lung cancer screening is recommended for adults aged 55-80 years who are at high risk for developing lung cancer because of a history of smoking. A yearly low-dose CT scan of the lungs is recommended for people who have at least a 30-pack-year history of smoking and are current smokers or have quit within the past 15 years. A pack year of smoking is smoking an average of 1 pack of cigarettes a day for 1 year (for example, a 30-pack-year history of smoking could mean smoking 1 pack a day for 30 years or 2 packs a day for 15 years). Yearly screening should continue until the smoker has stopped smoking for at least 15 years. Yearly screening should be stopped for people who develop a health problem that would prevent them from having lung cancer treatment.  If you choose to drink alcohol, do not have more than 2 drinks per day. One drink is considered to be 12 oz (360 mL) of beer, 5 oz (150 mL) of wine, or 1.5 oz (45 mL) of liquor.  Avoid the use of street drugs. Do not share needles with anyone. Ask for help if you need support or instructions about stopping the use of drugs.  High blood  pressure causes heart disease and increases the risk of stroke. High blood pressure is more likely to develop in:  People who have blood pressure in the end of the normal range (100-139/85-89 mm Hg).  People who are overweight or obese.  People who are African American.  If you are 61-97 years of age, have your blood pressure checked every 3-5 years. If you are 68 years of age or older, have your blood pressure checked every year. You should have your blood pressure measured twice--once when you are at a hospital or clinic, and once when you are not at a hospital or clinic. Record  the average of the two measurements. To check your blood pressure when you are not at a hospital or clinic, you can use:  An automated blood pressure machine at a pharmacy.  A home blood pressure monitor.  If you are 69-80 years old, ask your health care provider if you should take aspirin to prevent heart disease.  Diabetes screening involves taking a blood sample to check your fasting blood sugar level. This should be done once every 3 years after age 37 if you are at a normal weight and without risk factors for diabetes. Testing should be considered at a younger age or be carried out more frequently if you are overweight and have at least 1 risk factor for diabetes.  Colorectal cancer can be detected and often prevented. Most routine colorectal cancer screening begins at the age of 36 and continues through age 27. However, your health care provider may recommend screening at an earlier age if you have risk factors for colon cancer. On a yearly basis, your health care provider may provide home test kits to check for hidden blood in the stool. A small camera at the end of a tube may be used to directly examine the colon (sigmoidoscopy or colonoscopy) to detect the earliest forms of colorectal cancer. Talk to your health care provider about this at age 17 when routine screening begins. A direct exam of the colon should be repeated every 5-10 years through age 62, unless early forms of precancerous polyps or small growths are found.  People who are at an increased risk for hepatitis B should be screened for this virus. You are considered at high risk for hepatitis B if:  You were born in a country where hepatitis B occurs often. Talk with your health care provider about which countries are considered high risk.  Your parents were born in a high-risk country and you have not received a shot to protect against hepatitis B (hepatitis B vaccine).  You have HIV or AIDS.  You use needles to inject  street drugs.  You live with, or have sex with, someone who has hepatitis B.  You are a man who has sex with other men (MSM).  You get hemodialysis treatment.  You take certain medicines for conditions like cancer, organ transplantation, and autoimmune conditions.  Hepatitis C blood testing is recommended for all people born from [birth year] through 1965 and any individual with known risk factors for hepatitis C.  Healthy men should no longer receive prostate-specific antigen (PSA) blood tests as part of routine cancer screening. Talk to your health care provider about prostate cancer screening.  Testicular cancer screening is not recommended for adolescents or adult males who have no symptoms. Screening includes self-exam, a health care provider exam, and other screening tests. Consult with your health care provider about any symptoms you have or any concerns you  have about testicular cancer.  Practice safe sex. Use condoms and avoid high-risk sexual practices to reduce the spread of sexually transmitted infections (STIs).  You should be screened for STIs, including gonorrhea and chlamydia if:  You are sexually active and are younger than 24 years.  You are older than 24 years, and your health care provider tells you that you are at risk for this type of infection.  Your sexual activity has changed since you were last screened, and you are at an increased risk for chlamydia or gonorrhea. Ask your health care provider if you are at risk.  If you are at risk of being infected with HIV, it is recommended that you take a prescription medicine daily to prevent HIV infection. This is called pre-exposure prophylaxis (PrEP). You are considered at risk if:  You are a man who has sex with other men (MSM).  You are a heterosexual man who is sexually active with multiple partners.  You take drugs by injection.  You are sexually active with a partner who has HIV.  Talk with your health care provider  about whether you are at high risk of being infected with HIV. If you choose to begin PrEP, you should first be tested for HIV. You should then be tested every 3 months for as long as you are taking PrEP.  Use sunscreen. Apply sunscreen liberally and repeatedly throughout the day. You should seek shade when your shadow is shorter than you. Protect yourself by wearing long sleeves, pants, a wide-brimmed hat, and sunglasses year round whenever you are outdoors.  Tell your health care provider of new moles or changes in moles, especially if there is a change in shape or color. Also, tell your health care provider if a mole is larger than the size of a pencil eraser.  A one-time screening for abdominal aortic aneurysm (AAA) and surgical repair of large AAAs by ultrasound is recommended for men aged 3-75 years who are current or former smokers.  Stay current with your vaccines (immunizations).   This information is not intended to replace advice given to you by your health care provider. Make sure you discuss any questions you have with your health care provider.   Document Released: 05/28/2008 Document Revised: 12/21/2014 Document Reviewed: 04/27/2011 Elsevier Interactive Patient Education [employer].

## 2016-04-03 NOTE — Progress Notes (Signed)
   Subjective:    Patient ID: Cody Perez, male    DOB: 01/27/1951, 65 y.o.   MRN: [id number]  HPI  65 year old male who presents to the office today for evaluation of hypertension. He has a history of bipolar depression and is on multiple medications for this. He also has a history of mitral regurg, COPD, early dementia, and hyperlipidemia.   He reports that today his blood pressure has been high for " about a year". He was on Tenotetic at one time, but was taken off it by his PCP.   He has been using his treadmill, but he finds exercise hard due to previous injuries.   Denies headaches or blurred vision.    Review of Systems  Constitutional: Negative.   Respiratory: Negative.   Cardiovascular: Negative.   Neurological: Negative.   All other systems reviewed and are negative.  No past medical history on file.  Social History   Social History  . Marital Status: Married    Spouse Name: N/A  . Number of Children: N/A  . Years of Education: N/A   Occupational History  . Not on file.   Social History Main Topics  . Smoking status: Former Games developermoker  . Smokeless tobacco: Not on file  . Alcohol Use: Not on file  . Drug Use: Not on file  . Sexual Activity: Not on file   Other Topics Concern  . Not on file   Social History Narrative    No past surgical history on file.  Family History  Problem Relation Age of Onset  . Adopted: Yes    No Known Allergies  Current Outpatient Prescriptions on File Prior to Visit  Medication Sig Dispense Refill  . ABILIFY 10 MG tablet     . clonazePAM (KLONOPIN) 1 MG tablet     . donepezil (ARICEPT) 10 MG tablet 1 daily 100 tablet 3  . lamoTRIgine (LAMICTAL) 200 MG tablet     . QUEtiapine (SEROQUEL) 300 MG tablet     . risperiDONE (RISPERDAL) 3 MG tablet     . simvastatin (ZOCOR) 10 MG tablet TAKE 1 TABLET (10 MG TOTAL) BY MOUTH AT BEDTIME. 100 tablet 3  . simvastatin (ZOCOR) 10 MG tablet TAKE 1 TABLET (10 MG TOTAL) BY MOUTH AT  BEDTIME. 100 tablet 3   No current facility-administered medications on file prior to visit.    There were no vitals taken for this visit.       Objective:   Physical Exam  Constitutional: He is oriented to person, place, and time. He appears well-developed and well-nourished. No distress.  Cardiovascular: Normal rate, regular rhythm and intact distal pulses.  Exam reveals no friction rub.   Murmur heard. Pulmonary/Chest: Effort normal and breath sounds normal. No respiratory distress. He has no wheezes. He has no rales. He exhibits no tenderness.  Neurological: He is alert and oriented to person, place, and time.  Skin: Skin is warm and dry. No rash noted. He is not diaphoretic. No erythema. No pallor.  Psychiatric: He has a normal mood and affect. His behavior is normal. Judgment and thought content normal.  Nursing note and vitals reviewed.     Assessment & Plan:  1. Essential hypertension - Basic metabolic panel - CBC with Differential/Platelet - atenolol-chlorthalidone (TENORETIC) 50-25 MG tablet; Take 1 tablet by mouth daily.  Dispense: 100 tablet; Refill: 0 - Follow up in one week. - Low sodium diet and increase water  Shirline Freesory Torryn Hudspeth, NP

## 2016-04-09 ENCOUNTER — Ambulatory Visit (INDEPENDENT_AMBULATORY_CARE_PROVIDER_SITE_OTHER): Payer: BLUE CROSS/BLUE SHIELD | Admitting: Adult Health

## 2016-04-09 ENCOUNTER — Telehealth: Payer: Self-pay

## 2016-04-09 ENCOUNTER — Encounter: Payer: Self-pay | Admitting: Adult Health

## 2016-04-09 ENCOUNTER — Encounter (HOSPITAL_COMMUNITY): Payer: Self-pay | Admitting: *Deleted

## 2016-04-09 ENCOUNTER — Telehealth: Payer: Self-pay | Admitting: *Deleted

## 2016-04-09 ENCOUNTER — Emergency Department (HOSPITAL_COMMUNITY)
Admission: EM | Admit: 2016-04-09 | Discharge: 2016-04-09 | Disposition: A | Discharge status: Home / Self Care | Payer: BLUE CROSS/BLUE SHIELD | Attending: Emergency Medicine | Admitting: Emergency Medicine

## 2016-04-09 VITALS — BP 154/80 | Temp 98.4°F | Wt 200.1 lb

## 2016-04-09 DIAGNOSIS — I10 Essential (primary) hypertension: Secondary | ICD-10-CM

## 2016-04-09 DIAGNOSIS — E871 Hypo-osmolality and hyponatremia: Secondary | ICD-10-CM | POA: Insufficient documentation

## 2016-04-09 DIAGNOSIS — Z79899 Other long term (current) drug therapy: Secondary | ICD-10-CM | POA: Diagnosis not present

## 2016-04-09 DIAGNOSIS — Z87891 Personal history of nicotine dependence: Secondary | ICD-10-CM | POA: Diagnosis not present

## 2016-04-09 HISTORY — DX: Essential (primary) hypertension: I10

## 2016-04-09 LAB — BASIC METABOLIC PANEL
ANION GAP: 12 (ref 5–15)
BUN: 7 mg/dL (ref 6–23)
BUN: <5 mg/dL — ABNORMAL LOW (ref 6–20)
CALCIUM: 9.4 mg/dL (ref 8.4–10.5)
CHLORIDE: 81 meq/L — AB (ref 96–112)
CHLORIDE: 86 mmol/L — AB (ref 101–111)
CO2: 30 meq/L (ref 19–32)
CO2: 26 mmol/L (ref 22–32)
CREATININE: 0.93 mg/dL (ref 0.40–1.50)
Calcium: 9.7 mg/dL (ref 8.9–10.3)
Creatinine, Ser: 0.97 mg/dL (ref 0.61–1.24)
GFR calc Af Amer: >60 mL/min (ref >60)
GFR: 86.14 mL/min (ref >60.00)
Glucose, Bld: 126 mg/dL — ABNORMAL HIGH (ref 70–99)
Glucose, Bld: 102 mg/dL — ABNORMAL HIGH (ref 65–99)
POTASSIUM: 3.5 mmol/L (ref 3.5–5.1)
Potassium: 3.6 mEq/L (ref 3.5–5.1)
SODIUM: 118 meq/L — AB (ref 135–145)
SODIUM: 124 mmol/L — AB (ref 135–145)

## 2016-04-09 LAB — URINALYSIS, ROUTINE W REFLEX MICROSCOPIC
Bilirubin Urine: NEGATIVE
Glucose, UA: NEGATIVE mg/dL
Hgb urine dipstick: NEGATIVE
KETONES UR: 15 mg/dL — AB
LEUKOCYTES UA: NEGATIVE
NITRITE: NEGATIVE
PROTEIN: NEGATIVE mg/dL
Specific Gravity, Urine: 1.005 (ref 1.005–1.030)
pH: 7 (ref 5.0–8.0)

## 2016-04-09 LAB — CREATININE, URINE, RANDOM: CREATININE, URINE: 29.43 mg/dL

## 2016-04-09 LAB — CBC
HCT: 41.2 % (ref 39.0–52.0)
HEMOGLOBIN: 15.1 g/dL (ref 13.0–17.0)
MCH: 34.4 pg — AB (ref 26.0–34.0)
MCHC: 36.7 g/dL — ABNORMAL HIGH (ref 30.0–36.0)
MCV: 93.8 fL (ref 78.0–100.0)
PLATELETS: 176 10*3/uL (ref 150–400)
RBC: 4.39 MIL/uL (ref 4.22–5.81)
RDW: 11.9 % (ref 11.5–15.5)
WBC: 6.3 10*3/uL (ref 4.0–10.5)

## 2016-04-09 LAB — OSMOLALITY: OSMOLALITY: 252 mosm/kg — AB (ref 275–295)

## 2016-04-09 LAB — SODIUM, URINE, RANDOM: Sodium, Ur: 14 mmol/L

## 2016-04-09 LAB — TSH: TSH: 0.773 u[IU]/mL (ref 0.350–4.500)

## 2016-04-09 LAB — OSMOLALITY, URINE: OSMOLALITY UR: 120 mosm/kg — AB (ref 300–900)

## 2016-04-09 MED ORDER — SODIUM CHLORIDE 0.9 % IV BOLUS (SEPSIS)
1000.0000 mL | Freq: Once | INTRAVENOUS | Status: AC
  Administered 2016-04-09: 1000 mL via INTRAVENOUS

## 2016-04-09 NOTE — Patient Instructions (Signed)
It was great seeing you again!  Your blood pressure is coming down nicely. Continue to eat a low salt diet, drink plenty of water and exercise.   Follow up with me in 2.5 months   If you need anything in the meantime, please let me know.

## 2016-04-09 NOTE — Telephone Encounter (Signed)
Error

## 2016-04-09 NOTE — Discharge Instructions (Signed)
Hyponatremia °Hyponatremia is when the amount of salt (sodium) in your blood is too low. When sodium levels are low, your cells absorb extra water and they swell. The swelling happens throughout the body, but it mostly affects the brain. °CAUSES °This condition may be caused by: °· Heart, kidney, or liver problems. °· Thyroid problems. °· Adrenal gland problems. °· Metabolic conditions, such as syndrome of inappropriate antidiuretic hormone (SIADH). °· Severe vomiting and diarrhea. °· Certain medicines or illegal drugs. °· Dehydration. °· Drinking too much water. °· Eating a diet that is low in sodium. °· Large burns on your body. °· Sweating. °RISK FACTORS °This condition is more likely to develop in people who: °· Have long-term (chronic) kidney disease. °· Have heart failure. °· Have a medical condition that causes frequent or excessive diarrhea. °· Have metabolic conditions, such as Addison disease or SIADH. °· Take certain medicines that affect the sodium and fluid balance in the blood. Some of these medicine types include: °¨ Diuretics. °¨ NSAIDs. °¨ Some opioid pain medicines. °¨ Some antidepressants. °¨ Some seizure prevention medicines. °SYMPTOMS  °Symptoms of this condition include: °· Nausea and vomiting. °· Confusion. °· Lethargy. °· Agitation. °· Headache. °· Seizures. °· Unconsciousness. °· Appetite loss. °· Muscle weakness and cramping. °· Feeling weak or light-headed. °· Having a rapid heart rate. °· Fainting, in severe cases. °DIAGNOSIS °This condition is diagnosed with a medical history and physical exam. You will also have other tests, including: °· Blood tests. °· Urine tests. °TREATMENT °Treatment for this condition depends on the cause. Treatment may include: °· Fluids given through an IV tube that is inserted into one of your veins. °· Medicines to correct the sodium imbalance. If medicines are causing the condition, the medicines will need to be adjusted. °· Limiting water or fluid intake to  get the correct sodium balance. °HOME CARE INSTRUCTIONS °· Take medicines only as directed by your health care provider. Many medicines can make this condition worse. Talk with your health care provider about any medicines that you are currently taking. °· Carefully follow a recommended diet as directed by your health care provider. °· Carefully follow instructions from your health care provider about fluid restrictions. °· Keep all follow-up visits as directed by your health care provider. This is important. °· Do not drink alcohol. °SEEK MEDICAL CARE IF: °· You develop worsening nausea, fatigue, headache, confusion, or weakness. °· Your symptoms go away and then return. °· You have problems following the recommended diet. °SEEK IMMEDIATE MEDICAL CARE IF: °· You have a seizure. °· You faint. °· You have ongoing diarrhea or vomiting. °  °This information is not intended to replace advice given to you by your health care provider. Make sure you discuss any questions you have with your health care provider. °  °Document Released: 11/20/2002 Document Revised: 04/16/2015 Document Reviewed: 12/20/2014 °Elsevier Interactive Patient Education ©2016 [employer] ° ° ° °

## 2016-04-09 NOTE — ED Notes (Addendum)
Pt reports being seen at his PCP. Pt had blood work drawn and sodium was lower at 118. Pt was told to "stop taking the BP medication". Pt denies any symptoms at this time. Pt was told to come here by his MD for IV fluids.

## 2016-04-09 NOTE — ED Provider Notes (Signed)
CSN: [id number]     Arrival date & time 04/09/16  1301 History   First MD Initiated Contact with Patient 04/09/16 1637     Chief Complaint  Patient presents with  . Abnormal Lab     (Consider location/radiation/quality/duration/timing/severity/associated sxs/prior Treatment) HPI Patient is a 65 year old male with a history of hypertension who presents after referral from his primary care doctor for hyponatremia. Started chlorthalidone 3 days ago. He had routine follow-up BMP today, and was told to present to the emergency department for sodium of 117. He denies any change in the amount of his fluid intake, and says he has not noticed any change in the amount or frequency of urination. He has not had any headaches, blurry vision, nausea or vomiting. He has not had any confusion, and his wife says he has been acting normally. Past Medical History  Diagnosis Date  . Hypertension    History reviewed. No pertinent past surgical history. Family History  Problem Relation Age of Onset  . Adopted: Yes   Social History  Substance Use Topics  . Smoking status: Former Games developer  . Smokeless tobacco: None  . Alcohol Use: None    Review of Systems  All other systems reviewed and are negative.     Allergies  Review of patient's allergies indicates no known allergies.  Home Medications   Prior to Admission medications   Medication Sig Start Date End Date Taking? Authorizing Provider  ABILIFY 10 MG tablet Take 10 mg by mouth daily.  09/09/12  Yes Historical Provider, MD  atenolol-chlorthalidone (TENORETIC) 50-25 MG tablet Take 1 tablet by mouth daily. 04/03/16  Yes Shirline Frees, NP  clonazePAM (KLONOPIN) 1 MG tablet Take 1 mg by mouth 2 (two) times daily.  09/23/12  Yes Historical Provider, MD  donepezil (ARICEPT) 10 MG tablet 1 daily Patient taking differently: Take 10 mg by mouth at bedtime. 1 daily 11/30/13  Yes Roderick Pee, MD  lamoTRIgine (LAMICTAL) 200 MG tablet Take 200 mg by mouth  daily.  09/09/12  Yes Historical Provider, MD  QUEtiapine (SEROQUEL) 300 MG tablet Take 300 mg by mouth at bedtime.  09/19/12  Yes Historical Provider, MD  risperiDONE (RISPERDAL) 3 MG tablet Take 3 mg by mouth daily.  09/09/12  Yes Historical Provider, MD  simvastatin (ZOCOR) 10 MG tablet TAKE 1 TABLET (10 MG TOTAL) BY MOUTH AT BEDTIME. 12/20/14  Yes Roderick Pee, MD   BP 142/94 mmHg  Pulse 70  Temp(Src) 98.7 F (37.1 C) (Oral)  Resp 22  SpO2 92% Physical Exam  Constitutional: He is oriented to person, place, and time. He appears well-developed and well-nourished. No distress.  HENT:  Head: Normocephalic and atraumatic.  Eyes: Pupils are equal, round, and reactive to light.  Neck: Normal range of motion.  Cardiovascular: Normal rate, regular rhythm and intact distal pulses.   Pulmonary/Chest: Effort normal and breath sounds normal. He has no wheezes. He has no rales.  Abdominal: Soft. Bowel sounds are normal. There is no tenderness.  Musculoskeletal: Normal range of motion. He exhibits no edema.  Neurological: He is alert and oriented to person, place, and time. No cranial nerve deficit.  Skin: Skin is warm and dry.  Vitals reviewed.   ED Course  Procedures (including critical care time) Labs Review Labs Reviewed  CBC - Abnormal; Notable for the following:    MCH 34.4 (*)    MCHC 36.7 (*)    All other components within normal limits  BASIC METABOLIC PANEL - Abnormal;  Notable for the following:    Sodium 124 (*)    Chloride 86 (*)    Glucose, Bld 102 (*)    BUN <5 (*)    All other components within normal limits  OSMOLALITY - Abnormal; Notable for the following:    Osmolality 252 (*)    All other components within normal limits  OSMOLALITY, URINE - Abnormal; Notable for the following:    Osmolality, Ur 120 (*)    All other components within normal limits  URINALYSIS, ROUTINE W REFLEX MICROSCOPIC (NOT AT [facility]) - Abnormal; Notable for the following:    Ketones, ur 15 (*)     All other components within normal limits  TSH  SODIUM, URINE, RANDOM  CREATININE, URINE, RANDOM    Imaging Review No results found. I have personally reviewed and evaluated these images and lab results as part of my medical decision-making.   EKG Interpretation None      MDM   Final diagnoses:  Hyponatremia    Patient presents with hyponatremia. This is acute, and began after him starting chlorthalidone. He is asymptomatic here, has a normal neurologic exam. Labs are obtained to determine the etiology of his hyponatremia, and since this morning, his sodium has corrected on its own to 124. Since we know this hyponatremia had an acute onset, there is little risk of a more rapid correction.  Was given IV fluids, and labs were checked to rule out other causes of his hyponatremia. I have instructed him to restrict his water intake for the next 24 hours, and to follow-up with his primary care doctor tomorrow for recheck his BMP.  Erskine Emeryhris Lareen Mullings, MD 04/09/16 2000  Cathren LaineKevin Steinl, MD 04/09/16 [phone]

## 2016-04-09 NOTE — Telephone Encounter (Signed)
CRITICAL VALUE STICKER  CRITICAL VALUE: 117.6 sodium level  RECEIVER (INITALS): BT  DATE & TIME NOTIFIED:04/09/16 11:30  MD NOTIFIED: Duane Lopeorey Nafziger  TIME OF NOTIFICATION: 11: 31  RESPONSE: patient should go to ER per Denyse Amassorey.  Attempted to call patient but line was busy. Spoke with wife and she will let the patient know.

## 2016-04-09 NOTE — Progress Notes (Signed)
Subjective:    Patient ID: Cody Perez, male    DOB: 04/30/1951, 65 y.o.   MRN: [id number]  HPI  65 year old male who presents to the office today for one week follow up regarding hypertension. When I last saw him he reported that he had not had been taking any blood pressure medication for a year. He was on Tenotetic in the past but was taken off it by his PCP  BP Readings from Last 3 Encounters:  04/09/16 154/80  04/03/16 170/100  12/20/14 140/90   Denies any headaches, dizziness, blurred vision.He feels as though he is handling the medication well and is taking it every day.    Review of Systems  Constitutional: Negative.   Respiratory: Negative.   Gastrointestinal: Negative.   Neurological: Negative.   All other systems reviewed and are negative.  No past medical history on file.  Social History   Social History  . Marital Status: Married    Spouse Name: N/A  . Number of Children: N/A  . Years of Education: N/A   Occupational History  . Not on file.   Social History Main Topics  . Smoking status: Former Games developermoker  . Smokeless tobacco: Not on file  . Alcohol Use: Not on file  . Drug Use: Not on file  . Sexual Activity: Not on file   Other Topics Concern  . Not on file   Social History Narrative    No past surgical history on file.  Family History  Problem Relation Age of Onset  . Adopted: Yes    No Known Allergies  Current Outpatient Prescriptions on File Prior to Visit  Medication Sig Dispense Refill  . ABILIFY 10 MG tablet     . atenolol-chlorthalidone (TENORETIC) 50-25 MG tablet Take 1 tablet by mouth daily. 100 tablet 0  . clonazePAM (KLONOPIN) 1 MG tablet     . donepezil (ARICEPT) 10 MG tablet 1 daily 100 tablet 3  . lamoTRIgine (LAMICTAL) 200 MG tablet     . QUEtiapine (SEROQUEL) 300 MG tablet     . risperiDONE (RISPERDAL) 3 MG tablet     . simvastatin (ZOCOR) 10 MG tablet TAKE 1 TABLET (10 MG TOTAL) BY MOUTH AT BEDTIME. 100 tablet 3  .  simvastatin (ZOCOR) 10 MG tablet TAKE 1 TABLET (10 MG TOTAL) BY MOUTH AT BEDTIME. 100 tablet 3   No current facility-administered medications on file prior to visit.    BP 154/80 mmHg  Temp(Src) 98.4 F (36.9 C) (Oral)  Wt 200 lb 1.6 oz (90.765 kg)       Objective:   Physical Exam  Constitutional: He is oriented to person, place, and time. He appears well-developed and well-nourished. No distress.  Cardiovascular: Normal rate, regular rhythm, normal heart sounds and intact distal pulses.  Exam reveals no gallop and no friction rub.   No murmur heard. Pulmonary/Chest: Effort normal and breath sounds normal. No respiratory distress. He has no wheezes. He has no rales. He exhibits no tenderness.  Neurological: He is alert and oriented to person, place, and time.  Skin: Skin is warm and dry. No rash noted. No erythema. No pallor.  Psychiatric: He has a normal mood and affect. His behavior is normal. Judgment and thought content normal.  Nursing note and vitals reviewed.      Assessment & Plan:  1. Essential hypertension - Still not at goal.  - Will keep him on the current dose and recheck in 6-8 weeks.  -  Basic metabolic panel - Continue to monitor BP at home - Low salt diet, drink plenty of water and exercise.   Shirline Frees, NP

## 2016-04-10 ENCOUNTER — Telehealth: Payer: Self-pay | Admitting: Family Medicine

## 2016-04-10 ENCOUNTER — Other Ambulatory Visit: Payer: Self-pay | Admitting: Adult Health

## 2016-04-10 DIAGNOSIS — E871 Hypo-osmolality and hyponatremia: Secondary | ICD-10-CM

## 2016-04-10 NOTE — Telephone Encounter (Signed)
Spoke with patient.  Lab and follow up appointment made.

## 2016-04-10 NOTE — Telephone Encounter (Signed)
Pt need order for lab work that he is to have next week.  Pls advise of order.

## 2016-04-15 ENCOUNTER — Encounter: Payer: Self-pay | Admitting: Adult Health

## 2016-04-15 ENCOUNTER — Ambulatory Visit (INDEPENDENT_AMBULATORY_CARE_PROVIDER_SITE_OTHER): Payer: BLUE CROSS/BLUE SHIELD | Admitting: Adult Health

## 2016-04-15 VITALS — BP 152/90 | Temp 98.3°F | Ht 68.5 in | Wt 196.9 lb

## 2016-04-15 DIAGNOSIS — I10 Essential (primary) hypertension: Secondary | ICD-10-CM

## 2016-04-15 MED ORDER — LISINOPRIL 20 MG PO TABS: 20.0000 mg | ORAL_TABLET | Freq: Every day | ORAL | Status: DC

## 2016-04-15 NOTE — Progress Notes (Signed)
Subjective:    Patient ID: Cody Perez, male    DOB: 11/03/1951, 65 y.o.   MRN: [id number]  HPI  65 year old male who presents to the office today for follow up regarding hypertension. We had restarted his Tenoretic and when I brought him back for follow up, his Sodium was 118. I had him stop the blood pressure medication and sent him to the ER. His BMP was checked in the ER and his sodium had corrected itself to 124. He was given IV fluids.  Today he reports that he is feeling good. He denies any chest pain, SOB, or confusion.   He has not been taking Tenoretic  Review of Systems  Constitutional: Negative.   Respiratory: Negative.   Cardiovascular: Negative.   Genitourinary: Negative.   Neurological: Negative.   Psychiatric/Behavioral: Negative.   All other systems reviewed and are negative.  Past Medical History  Diagnosis Date  . Hypertension     Social History   Social History  . Marital Status: Married    Spouse Name: N/A  . Number of Children: N/A  . Years of Education: N/A   Occupational History  . Not on file.   Social History Main Topics  . Smoking status: Former Games developermoker  . Smokeless tobacco: Not on file  . Alcohol Use: Not on file  . Drug Use: Not on file  . Sexual Activity: Not on file   Other Topics Concern  . Not on file   Social History Narrative    No past surgical history on file.  Family History  Problem Relation Age of Onset  . Adopted: Yes    No Known Allergies  Current Outpatient Prescriptions on File Prior to Visit  Medication Sig Dispense Refill  . ABILIFY 10 MG tablet Take 10 mg by mouth daily.     Marland Kitchen. atenolol-chlorthalidone (TENORETIC) 50-25 MG tablet Take 1 tablet by mouth daily. 100 tablet 0  . clonazePAM (KLONOPIN) 1 MG tablet Take 1 mg by mouth 2 (two) times daily.     Marland Kitchen. donepezil (ARICEPT) 10 MG tablet 1 daily (Patient taking differently: Take 10 mg by mouth at bedtime. 1 daily) 100 tablet 3  . lamoTRIgine (LAMICTAL) 200  MG tablet Take 200 mg by mouth daily.     . QUEtiapine (SEROQUEL) 300 MG tablet Take 300 mg by mouth at bedtime.     . risperiDONE (RISPERDAL) 3 MG tablet Take 3 mg by mouth daily.     . simvastatin (ZOCOR) 10 MG tablet TAKE 1 TABLET (10 MG TOTAL) BY MOUTH AT BEDTIME. 100 tablet 3   No current facility-administered medications on file prior to visit.    There were no vitals taken for this visit.       Objective:   Physical Exam  Constitutional: He is oriented to person, place, and time. He appears well-developed and well-nourished. No distress.  Cardiovascular: Normal rate, regular rhythm, normal heart sounds and intact distal pulses.  Exam reveals no gallop and no friction rub.   No murmur heard. Pulmonary/Chest: Effort normal and breath sounds normal. No respiratory distress. He has no wheezes. He has no rales. He exhibits no tenderness.  Musculoskeletal: Normal range of motion. He exhibits no edema or tenderness.  Neurological: He is alert and oriented to person, place, and time.  Skin: Skin is warm and dry. No rash noted. He is not diaphoretic. No erythema. No pallor.  Psychiatric: He has a normal mood and affect. His behavior is normal.  Judgment and thought content normal.  Nursing note and vitals reviewed.     Assessment & Plan:  1. Essential hypertension - lisinopril (PRINIVIL,ZESTRIL) 20 MG tablet; Take 1 tablet (20 mg total) by mouth daily.  Dispense: 30 tablet; Refill: 1 - Follow up in two weeks or sooner if needed - BMP drawn   Shirline Frees, NP

## 2016-04-15 NOTE — Patient Instructions (Signed)
It was great seeing you again   I have sent in a prescription for Lisnopril, take daily.   Follow up with me in 2 weeks.

## 2016-04-30 ENCOUNTER — Ambulatory Visit (INDEPENDENT_AMBULATORY_CARE_PROVIDER_SITE_OTHER): Payer: BLUE CROSS/BLUE SHIELD | Admitting: Adult Health

## 2016-04-30 VITALS — BP 126/78 | Temp 97.5°F | Ht 68.5 in | Wt 190.4 lb

## 2016-04-30 DIAGNOSIS — I10 Essential (primary) hypertension: Secondary | ICD-10-CM | POA: Diagnosis not present

## 2016-04-30 NOTE — Progress Notes (Signed)
Subjective:    Patient ID: Cody Perez, male    DOB: 17-Mar-1951, 65 y.o.   MRN: [id number]  HPI  65 year old male who presents to the office today for follow up regarding hypertension. The last time I saw him was two weeks ago. At that time we switched him from Tenoric to 20 mg Lisinopril.   He reports that he is doing well. Denies CP, SOB, headache, blurred vision, or dry cough.   His BP is now well controlled 126/78 today  Review of Systems  Constitutional: Negative.   HENT: Negative.   Respiratory: Negative.   Cardiovascular: Negative.   Gastrointestinal: Negative.   Neurological: Negative.   Psychiatric/Behavioral: Negative.   All other systems reviewed and are negative.  Past Medical History  Diagnosis Date  . Hypertension     Social History   Social History  . Marital Status: Married    Spouse Name: N/A  . Number of Children: N/A  . Years of Education: N/A   Occupational History  . Not on file.   Social History Main Topics  . Smoking status: Former Games developer  . Smokeless tobacco: Not on file  . Alcohol Use: Not on file  . Drug Use: Not on file  . Sexual Activity: Not on file   Other Topics Concern  . Not on file   Social History Narrative    No past surgical history on file.  Family History  Problem Relation Age of Onset  . Adopted: Yes    No Known Allergies  Current Outpatient Prescriptions on File Prior to Visit  Medication Sig Dispense Refill  . ABILIFY 10 MG tablet Take 10 mg by mouth daily.     . clonazePAM (KLONOPIN) 1 MG tablet Take 1 mg by mouth 2 (two) times daily.     Marland Kitchen donepezil (ARICEPT) 10 MG tablet 1 daily (Patient taking differently: Take 10 mg by mouth at bedtime. 1 daily) 100 tablet 3  . lamoTRIgine (LAMICTAL) 200 MG tablet Take 200 mg by mouth daily.     Marland Kitchen lisinopril (PRINIVIL,ZESTRIL) 20 MG tablet Take 1 tablet (20 mg total) by mouth daily. 30 tablet 1  . QUEtiapine (SEROQUEL) 300 MG tablet Take 300 mg by mouth at bedtime.      . risperiDONE (RISPERDAL) 3 MG tablet Take 3 mg by mouth daily.     . simvastatin (ZOCOR) 10 MG tablet TAKE 1 TABLET (10 MG TOTAL) BY MOUTH AT BEDTIME. 100 tablet 3   No current facility-administered medications on file prior to visit.    BP 126/78 mmHg  Temp(Src) 97.5 F (36.4 C) (Oral)  Ht 5' 8.5" (1.74 m)  Wt 190 lb 6.4 oz (86.365 kg)  BMI 28.53 kg/m2       Objective:   Physical Exam  Constitutional: He is oriented to person, place, and time. He appears well-developed and well-nourished. No distress.  Cardiovascular: Normal rate, regular rhythm, normal heart sounds and intact distal pulses.  Exam reveals no gallop.   No murmur heard. Pulmonary/Chest: Effort normal and breath sounds normal. No respiratory distress. He has no wheezes. He has no rales. He exhibits no tenderness.  Neurological: He is alert and oriented to person, place, and time.  Skin: Skin is warm and dry. No rash noted. He is not diaphoretic. No erythema. No pallor.  Psychiatric: He has a normal mood and affect. His behavior is normal. Judgment and thought content normal.  Vitals reviewed.      Assessment & Plan:  1. Essential hypertension - Now controlled - Continue with lisinopril 20 mg - Diet and exercise - Stop smoking - Start monitoring BP at home. Return parameters given   Shirline Freesory Elbony Mcclimans, NP

## 2016-06-07 ENCOUNTER — Other Ambulatory Visit: Payer: Self-pay | Admitting: Adult Health

## 2016-06-08 NOTE — Telephone Encounter (Signed)
Refill sent to pharmacy.   

## 2016-07-20 ENCOUNTER — Other Ambulatory Visit: Payer: Self-pay | Admitting: Adult Health

## 2016-07-20 DIAGNOSIS — I10 Essential (primary) hypertension: Secondary | ICD-10-CM

## 2016-08-03 ENCOUNTER — Other Ambulatory Visit: Payer: Self-pay | Admitting: Adult Health

## 2016-09-29 ENCOUNTER — Other Ambulatory Visit: Payer: Self-pay | Admitting: Adult Health

## 2016-09-30 NOTE — Telephone Encounter (Signed)
Ok to refill for one year, 90+3

## 2017-02-05 ENCOUNTER — Other Ambulatory Visit: Payer: Self-pay | Admitting: Family Medicine

## 2017-02-09 ENCOUNTER — Other Ambulatory Visit: Payer: Self-pay | Admitting: Family Medicine

## 2017-02-09 ENCOUNTER — Telehealth: Payer: Self-pay | Admitting: Family Medicine

## 2017-02-09 NOTE — Telephone Encounter (Signed)
Pt needs CPX or wellness by March 2018.  Unsure what insurance he has.  Please help him get on the schedule.  Will place lab orders if needed.

## 2017-02-09 NOTE — Telephone Encounter (Signed)
Sent to the pharmacy by e-scribe for 90 days.  Pt needs cpx/wellness and labs in March 2018. Message sent to scheduling.

## 2017-02-10 NOTE — Telephone Encounter (Signed)
lmom for pt to call back

## 2017-02-12 NOTE — Telephone Encounter (Signed)
Pt will call back to sch.

## 2017-02-23 NOTE — Telephone Encounter (Signed)
Pt has been scheduled, but cannot come in until end of May. Pt scheduled May 23 at 8:30 am.

## 2017-05-04 ENCOUNTER — Encounter: Payer: Self-pay | Admitting: Gastroenterology

## 2017-05-05 ENCOUNTER — Ambulatory Visit (INDEPENDENT_AMBULATORY_CARE_PROVIDER_SITE_OTHER): Payer: BLUE CROSS/BLUE SHIELD | Admitting: Family Medicine

## 2017-05-05 ENCOUNTER — Encounter: Payer: Self-pay | Admitting: Family Medicine

## 2017-05-05 VITALS — BP 134/80 | Temp 98.3°F | Ht 68.0 in | Wt 184.0 lb

## 2017-05-05 DIAGNOSIS — Z23 Encounter for immunization: Secondary | ICD-10-CM

## 2017-05-05 DIAGNOSIS — Q688 Other specified congenital musculoskeletal deformities: Secondary | ICD-10-CM | POA: Diagnosis not present

## 2017-05-05 DIAGNOSIS — E7800 Pure hypercholesterolemia, unspecified: Secondary | ICD-10-CM

## 2017-05-05 DIAGNOSIS — B351 Tinea unguium: Secondary | ICD-10-CM

## 2017-05-05 DIAGNOSIS — Z125 Encounter for screening for malignant neoplasm of prostate: Secondary | ICD-10-CM

## 2017-05-05 DIAGNOSIS — L603 Nail dystrophy: Secondary | ICD-10-CM | POA: Diagnosis not present

## 2017-05-05 DIAGNOSIS — Z136 Encounter for screening for cardiovascular disorders: Secondary | ICD-10-CM

## 2017-05-05 DIAGNOSIS — M79604 Pain in right leg: Secondary | ICD-10-CM

## 2017-05-05 DIAGNOSIS — E785 Hyperlipidemia, unspecified: Secondary | ICD-10-CM | POA: Diagnosis not present

## 2017-05-05 DIAGNOSIS — Z0001 Encounter for general adult medical examination with abnormal findings: Secondary | ICD-10-CM

## 2017-05-05 DIAGNOSIS — Z87891 Personal history of nicotine dependence: Secondary | ICD-10-CM

## 2017-05-05 DIAGNOSIS — I10 Essential (primary) hypertension: Secondary | ICD-10-CM

## 2017-05-05 DIAGNOSIS — I080 Rheumatic disorders of both mitral and aortic valves: Secondary | ICD-10-CM

## 2017-05-05 LAB — BASIC METABOLIC PANEL
BUN: 7 mg/dL (ref 6–23)
CALCIUM: 9.3 mg/dL (ref 8.4–10.5)
CO2: 26 meq/L (ref 19–32)
Chloride: 99 mEq/L (ref 96–112)
Creatinine, Ser: 0.98 mg/dL (ref 0.40–1.50)
GFR: 80.83 mL/min (ref >60.00)
Glucose, Bld: 96 mg/dL (ref 70–99)
Potassium: 4.3 mEq/L (ref 3.5–5.1)
SODIUM: 132 meq/L — AB (ref 135–145)

## 2017-05-05 LAB — POC URINALSYSI DIPSTICK (AUTOMATED)
Bilirubin, UA: NEGATIVE
GLUCOSE UA: NEGATIVE
Ketones, UA: NEGATIVE
Leukocytes, UA: NEGATIVE
Nitrite, UA: NEGATIVE
PROTEIN UA: NEGATIVE
RBC UA: NEGATIVE
SPEC GRAV UA: 1.015 (ref 1.010–1.025)
UROBILINOGEN UA: 0.2 U/dL
pH, UA: 6 (ref 5.0–8.0)

## 2017-05-05 LAB — HEPATIC FUNCTION PANEL
ALT: 15 U/L (ref 0–53)
AST: 26 U/L (ref 0–37)
Albumin: 4.1 g/dL (ref 3.5–5.2)
Alkaline Phosphatase: 97 U/L (ref 39–117)
Bilirubin, Direct: 0.1 mg/dL (ref 0.0–0.3)
TOTAL PROTEIN: 6.7 g/dL (ref 6.0–8.3)
Total Bilirubin: 0.5 mg/dL (ref 0.2–1.2)

## 2017-05-05 LAB — CBC WITH DIFFERENTIAL/PLATELET
BASOS ABS: 0 10*3/uL (ref 0.0–0.1)
Basophils Relative: 0.5 % (ref 0.0–3.0)
Eosinophils Absolute: 0 10*3/uL (ref 0.0–0.7)
Eosinophils Relative: 0.5 % (ref 0.0–5.0)
HEMATOCRIT: 42.2 % (ref 39.0–52.0)
Hemoglobin: 14.6 g/dL (ref 13.0–17.0)
LYMPHS PCT: 32.7 % (ref 12.0–46.0)
Lymphs Abs: 1.7 10*3/uL (ref 0.7–4.0)
MCHC: 34.6 g/dL (ref 30.0–36.0)
MCV: 101.6 fl — ABNORMAL HIGH (ref 78.0–100.0)
Monocytes Absolute: 0.4 10*3/uL (ref 0.1–1.0)
Monocytes Relative: 8.6 % (ref 3.0–12.0)
NEUTROS ABS: 2.9 10*3/uL (ref 1.4–7.7)
Neutrophils Relative %: 57.7 % (ref 43.0–77.0)
Platelets: 204 10*3/uL (ref 150.0–400.0)
RBC: 4.16 Mil/uL — AB (ref 4.22–5.81)
RDW: 13.3 % (ref 11.5–15.5)
WBC: 5.1 10*3/uL (ref 4.0–10.5)

## 2017-05-05 LAB — LIPID PANEL
CHOL/HDL RATIO: 2
Cholesterol: 148 mg/dL (ref 0–200)
HDL: 83 mg/dL (ref >39.00)
LDL Cholesterol: 56 mg/dL (ref 0–99)
NONHDL: 65.21
Triglycerides: 47 mg/dL (ref 0.0–149.0)
VLDL: 9.4 mg/dL (ref 0.0–40.0)

## 2017-05-05 LAB — PSA: PSA: 1.41 ng/mL (ref 0.10–4.00)

## 2017-05-05 LAB — TSH: TSH: 0.73 u[IU]/mL (ref 0.35–4.50)

## 2017-05-05 MED ORDER — SIMVASTATIN 10 MG PO TABS: ORAL_TABLET | ORAL | 4 refills | Status: DC

## 2017-05-05 MED ORDER — LISINOPRIL 20 MG PO TABS: ORAL_TABLET | ORAL | 4 refills | Status: DC

## 2017-05-05 NOTE — Progress Notes (Signed)
Cody Perez is a 66 year old married male nonsmoker who comes in today for general physical examination because of a history of hypertension hyperlipidemia BPH without obstruction  He gets routine eye care, dental care, colonoscopy ......... 5 years ago and GI was normal.  Vaccination history tetanus 2010 will give him Pneumovax and shingles vaccine today  He takes lisinopril 20 mg daily for hypertension BP 134/80  He takes Zocor and a baby aspirin daily for hyperlipidemia. Labs today.  He also sees his psychiatrist on a regular basis. He has a history of bipolar depression. 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 No close thank you  He also has BPH with nocturia.,,, PSA pending  He's had pain in the right posterior leg for the past 6 years. He said he tore the quadriceps muscle about 6 years ago. He would like to see a physical therapist.  He quit smoking March 2018 cold [place].  14 point review of systems June otherwise negative  Social history,,,, [place] younger daughter just recently got married older daughters can get married. He and his wife [facility] [place]  BP 134/80 (BP Location: Right Arm)   Temp 98.3 F (36.8 C) (Oral)   Ht 5\' 8"  (1.727 m)   Wt 184 lb (83.5 kg)   BMI 27.98 kg/m  , Enjoys well-developed well-nourished male no acute distress vital signs stable he is afebrile. HEENT were negative except he wears glasses neck was supple thyroid is not enlarged no carotid bruits cardiopulmonary exam normal except for a grade 2 systolic ejection murmur heard best over the apex. This is a murmur he's had previously. Hasn't changed in intensity.  Abdominal exam normal genitalia normal circumcised male rectum normal. Guaiac negative prostate ....Marland Kitchen.Marland Kitchen.Marland Kitchen. 1+ symmetrical BPH. 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  He also has severe dystrophic toenails. I trimmed all his toenails for him.  #1 hypertension at goal....... continue current therapy  #2 hyperlipidemia........ continue current therapy check labs  #3 history of depression bipolar type.......... 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 quit March 2018........ pulmonary consult to see if he's a candidate for the long-term lung screening program.  #7 dystrophic toenails,,,,,,,, soak in file weekly  #8 valvular heart disease,,,,,,,,, murmur unchanged,,,,,, follow-up in one year

## 2017-05-05 NOTE — Patient Instructions (Signed)
Continue current medications  Labs today......... I will call you if there is anything abnormal  We'll get you set up a consult in physical therapy for evaluation of your right leg  We'll also get you set up a consult this summer with one of the pulmonary folks to evaluate her lung function and to see if you're a candidate for the lung cancer screening program.  Return in one year for general physical exam sooner if any problems  Soak in file your toenails weekly.

## 2017-10-05 ENCOUNTER — Ambulatory Visit: Payer: BLUE CROSS/BLUE SHIELD

## 2017-10-26 ENCOUNTER — Ambulatory Visit (INDEPENDENT_AMBULATORY_CARE_PROVIDER_SITE_OTHER): Payer: Self-pay

## 2017-10-26 DIAGNOSIS — Z23 Encounter for immunization: Secondary | ICD-10-CM

## 2018-05-12 ENCOUNTER — Other Ambulatory Visit: Payer: Self-pay | Admitting: Family Medicine

## 2018-05-12 DIAGNOSIS — E7800 Pure hypercholesterolemia, unspecified: Secondary | ICD-10-CM

## 2018-05-30 DIAGNOSIS — F3176 Bipolar disorder, in full remission, most recent episode depressed: Secondary | ICD-10-CM | POA: Diagnosis not present

## 2018-07-03 ENCOUNTER — Other Ambulatory Visit: Payer: Self-pay | Admitting: Family Medicine

## 2018-07-04 NOTE — Telephone Encounter (Signed)
Pt needs an appointment for more refills 

## 2018-09-20 ENCOUNTER — Ambulatory Visit (INDEPENDENT_AMBULATORY_CARE_PROVIDER_SITE_OTHER): Payer: Medicare Other | Admitting: Family Medicine

## 2018-09-20 ENCOUNTER — Encounter: Payer: Self-pay | Admitting: Family Medicine

## 2018-09-20 ENCOUNTER — Ambulatory Visit (INDEPENDENT_AMBULATORY_CARE_PROVIDER_SITE_OTHER): Payer: Self-pay

## 2018-09-20 VITALS — BP 130/80 | HR 99 | Temp 98.4°F | Ht 67.0 in | Wt 188.0 lb

## 2018-09-20 DIAGNOSIS — R05 Cough: Secondary | ICD-10-CM | POA: Diagnosis not present

## 2018-09-20 DIAGNOSIS — E785 Hyperlipidemia, unspecified: Secondary | ICD-10-CM | POA: Diagnosis not present

## 2018-09-20 DIAGNOSIS — I080 Rheumatic disorders of both mitral and aortic valves: Secondary | ICD-10-CM | POA: Diagnosis not present

## 2018-09-20 DIAGNOSIS — I10 Essential (primary) hypertension: Secondary | ICD-10-CM | POA: Diagnosis not present

## 2018-09-20 DIAGNOSIS — Z136 Encounter for screening for cardiovascular disorders: Secondary | ICD-10-CM | POA: Diagnosis not present

## 2018-09-20 DIAGNOSIS — R058 Other specified cough: Secondary | ICD-10-CM

## 2018-09-20 DIAGNOSIS — R319 Hematuria, unspecified: Secondary | ICD-10-CM | POA: Diagnosis not present

## 2018-09-20 DIAGNOSIS — Z720 Tobacco use: Secondary | ICD-10-CM

## 2018-09-20 LAB — POCT URINALYSIS DIPSTICK
Bilirubin, UA: NEGATIVE
Glucose, UA: NEGATIVE
Ketones, UA: NEGATIVE
LEUKOCYTES UA: NEGATIVE
NITRITE UA: NEGATIVE
ODOR: NEGATIVE
PH UA: 6.5 (ref 5.0–8.0)
PROTEIN UA: NEGATIVE
Spec Grav, UA: 1.015 (ref 1.010–1.025)
UROBILINOGEN UA: 0.2 U/dL

## 2018-09-20 LAB — HEPATIC FUNCTION PANEL
ALT: 16 U/L (ref 0–53)
AST: 27 U/L (ref 0–37)
Albumin: 4.1 g/dL (ref 3.5–5.2)
Alkaline Phosphatase: 108 U/L (ref 39–117)
BILIRUBIN TOTAL: 0.5 mg/dL (ref 0.2–1.2)
Bilirubin, Direct: 0.1 mg/dL (ref 0.0–0.3)
Total Protein: 7 g/dL (ref 6.0–8.3)

## 2018-09-20 LAB — BASIC METABOLIC PANEL
BUN: 7 mg/dL (ref 6–23)
CALCIUM: 9.5 mg/dL (ref 8.4–10.5)
CO2: 27 mEq/L (ref 19–32)
Chloride: 97 mEq/L (ref 96–112)
Creatinine, Ser: 0.97 mg/dL (ref 0.40–1.50)
GFR: 81.46 mL/min (ref >60.00)
GLUCOSE: 112 mg/dL — AB (ref 70–99)
Potassium: 4.6 mEq/L (ref 3.5–5.1)
SODIUM: 132 meq/L — AB (ref 135–145)

## 2018-09-20 LAB — CBC WITH DIFFERENTIAL/PLATELET
BASOS ABS: 0 10*3/uL (ref 0.0–0.1)
BASOS PCT: 0.7 % (ref 0.0–3.0)
Eosinophils Absolute: 0 10*3/uL (ref 0.0–0.7)
Eosinophils Relative: 0.9 % (ref 0.0–5.0)
HEMATOCRIT: 40 % (ref 39.0–52.0)
HEMOGLOBIN: 14.3 g/dL (ref 13.0–17.0)
LYMPHS PCT: 32.3 % (ref 12.0–46.0)
Lymphs Abs: 1.8 10*3/uL (ref 0.7–4.0)
MCHC: 35.6 g/dL (ref 30.0–36.0)
MCV: 102 fl — ABNORMAL HIGH (ref 78.0–100.0)
Monocytes Absolute: 0.4 10*3/uL (ref 0.1–1.0)
Monocytes Relative: 7.2 % (ref 3.0–12.0)
Neutro Abs: 3.3 10*3/uL (ref 1.4–7.7)
Neutrophils Relative %: 58.9 % (ref 43.0–77.0)
Platelets: 272 10*3/uL (ref 150.0–400.0)
RBC: 3.92 Mil/uL — ABNORMAL LOW (ref 4.22–5.81)
RDW: 13.3 % (ref 11.5–15.5)
WBC: 5.5 10*3/uL (ref 4.0–10.5)

## 2018-09-20 LAB — LIPID PANEL
CHOLESTEROL: 143 mg/dL (ref 0–200)
HDL: 83.2 mg/dL (ref >39.00)
LDL CALC: 51 mg/dL (ref 0–99)
NonHDL: 59.51
Total CHOL/HDL Ratio: 2
Triglycerides: 43 mg/dL (ref 0.0–149.0)
VLDL: 8.6 mg/dL (ref 0.0–40.0)

## 2018-09-20 LAB — TSH: TSH: 0.76 u[IU]/mL (ref 0.35–4.50)

## 2018-09-20 MED ORDER — LISINOPRIL 20 MG PO TABS: ORAL_TABLET | ORAL | 4 refills | Status: DC

## 2018-09-20 MED ORDER — SIMVASTATIN 10 MG PO TABS: ORAL_TABLET | ORAL | 3 refills | Status: DC

## 2018-09-20 NOTE — Progress Notes (Signed)
Cody Perez is a 67 year old married male smoker....... 1/2 pack of cigarettes a day for 40+ years..... Who comes in today to address the following issues  He has a history of long-term smoking and underlying cough.  He denies it but his wife says he has trouble with coughing and clearing his throat all the time.  He is having difficulty with his his right leg.  He had a quadriceps rupture and had that repaired by Dr. Lajoyce Corners years ago.  He also has history of mitral valve disease and we did an echo many years ago.  His wife would like that checked also.  He gets routine eye care, no dental care, last colonoscopy by Dr. Russella Dar 2008 was normal.  Cognitive function normal, he does not exercise on a regular basis because of his orthopedic difficulties.,  Home health safety reviewed no issues identified, no guns in the house, he does have a healthcare power of attorney and living well  He takes lisinopril 20 mg daily for hypertension.  BP today normal  He takes Zocor 10 mg at bedtime because of a history of hyperlipidemia.  He also takes an aspirin tablet.  We will check lipids today  He is seen by his psychiatrist on a regular basis he is on Abilify, Klonopin, Aricept, Lamictal, Seroquel, and Risperdal.  14 point review of system reviewed otherwise negative except for above  EKG was done because of a history of hypertension.  EKG was normal and unchanged for him  BP 130/80 (BP Location: Right Arm, Patient Position: Sitting, Cuff Size: Normal)   Pulse 99   Temp 98.4 F (36.9 C) (Oral)   Ht 5\' 7"  (1.702 m)   Wt 188 lb (85.3 kg)   SpO2 95%   BMI 29.44 kg/m  Well-developed elderly male no acute distress examination HEENT was pertinent and he has early bilateral cataracts.  Is extremely dark poor dentition and gum disease.  Neck was supple thyroid is not enlarged no carotid bruits.  Pulmonary exam shows symmetrical but decreased breath sounds.  Cardiac exam was normal except a grade 2/6 systolic ejection  murmur consistent with MVP which she has had in the past.  Abdominal exam was negative genitalia normal male rectal normal stool guaiac negative prostate normal.  Extremities........ scar right anterior knee up to mid quad from previous quad surgery.  Also deformity of his feet.  1.  Hypertension at goal....... continue current medicines  2.  History of hyperlipidemia....... continue current meds check labs  3.  History of mental illness as noted above........ continue meds and follow-up with psychiatrist  4.  Cough........ chest x-ray...Marland KitchenMarland KitchenMarland Kitchen pulmonary evaluation  5.  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 due to.  6.  Mitral valve disease......... unchanged  7.  Tobacco abuse.......... again as we have every year advised him to stop smoking

## 2018-09-20 NOTE — Patient Instructions (Signed)
Labs today............ I will call if there is anything abnormal  Call your insurance company to find out we can get the new shingles vaccine  Chest x-ray today.......... I would recommend we get you set up in pulmonary for evaluation  Call GI and get set up for follow-up colonoscopy  I would recommend you stop smoking..........Marland Kitchen with your other medications I do not think the Chantix would be good for you....... therefore I would start a tapering program by 2/week and set a quit date  Call make an appointment to see your dentist ASAP

## 2018-09-21 NOTE — Addendum Note (Signed)
Addended by: Waymon Amato R on: 09/21/2018 03:38 PM   Modules accepted: Orders

## 2018-09-27 ENCOUNTER — Ambulatory Visit: Payer: Self-pay

## 2018-11-03 ENCOUNTER — Ambulatory Visit (INDEPENDENT_AMBULATORY_CARE_PROVIDER_SITE_OTHER): Payer: Medicare Other | Admitting: Family Medicine

## 2018-11-03 ENCOUNTER — Encounter: Payer: Self-pay | Admitting: Family Medicine

## 2018-11-03 VITALS — BP 146/88 | HR 90 | Temp 98.9°F | Ht 67.0 in | Wt 182.5 lb

## 2018-11-03 DIAGNOSIS — Z7689 Persons encountering health services in other specified circumstances: Secondary | ICD-10-CM

## 2018-11-03 DIAGNOSIS — Z716 Tobacco abuse counseling: Secondary | ICD-10-CM | POA: Diagnosis not present

## 2018-11-03 DIAGNOSIS — E785 Hyperlipidemia, unspecified: Secondary | ICD-10-CM

## 2018-11-03 DIAGNOSIS — J449 Chronic obstructive pulmonary disease, unspecified: Secondary | ICD-10-CM

## 2018-11-03 DIAGNOSIS — I10 Essential (primary) hypertension: Secondary | ICD-10-CM

## 2018-11-03 DIAGNOSIS — F17200 Nicotine dependence, unspecified, uncomplicated: Secondary | ICD-10-CM | POA: Diagnosis not present

## 2018-11-03 DIAGNOSIS — I080 Rheumatic disorders of both mitral and aortic valves: Secondary | ICD-10-CM

## 2018-11-03 NOTE — Patient Instructions (Addendum)
Please obtain patient photograph for chart.      Your goal blood pressure should be 130/80 or less on a regular basis, her medications should be started.  Normal blood pressure is 120/80 or less.    Hypertension Hypertension, commonly called high blood pressure, is when the force of blood pumping through the arteries is too strong. The arteries are the blood vessels that carry blood from the heart throughout the body. Hypertension forces the heart to work harder to pump blood and may cause arteries to become narrow or stiff. Having untreated or uncontrolled hypertension can cause heart attacks, strokes, kidney disease, and other problems. A blood pressure reading consists of a higher number over a lower number. Ideally, your blood pressure should be below 120/80. The first ("top") number is called the systolic pressure. It is a measure of the pressure in your arteries as your heart beats. The second ("bottom") number is called the diastolic pressure. It is a measure of the pressure in your arteries as the heart relaxes. What are the causes? The cause of this condition is not known. What increases the risk? Some risk factors for high blood pressure are under your control. Others are not. Factors you can change  Smoking.  Having type 2 diabetes mellitus, high cholesterol, or both.  Not getting enough exercise or physical activity.  Being overweight.  Having too much fat, sugar, calories, or salt (sodium) in your diet.  Drinking too much alcohol. Factors that are difficult or impossible to change  Having chronic kidney disease.  Having a family history of high blood pressure.  Age. Risk increases with age.  Race. You may be at higher risk if you are African-American.  Gender. Men are at higher risk than women before age 97. After age 43, women are at higher risk than men.  Having obstructive sleep apnea.  Stress. What are the signs or symptoms? Extremely high blood pressure  (hypertensive crisis) may cause:  Headache.  Anxiety.  Shortness of breath.  Nosebleed.  Nausea and vomiting.  Severe chest pain.  Jerky movements you cannot control (seizures).  How is this diagnosed? This condition is diagnosed by measuring your blood pressure while you are seated, with your arm resting on a surface. The cuff of the blood pressure monitor will be placed directly against the skin of your upper arm at the level of your heart. It should be measured at least twice using the same arm. Certain conditions can cause a difference in blood pressure between your right and left arms. Certain factors can cause blood pressure readings to be lower or higher than normal (elevated) for a short period of time:  When your blood pressure is higher when you are in a health care provider's office than when you are at home, this is called white coat hypertension. Most people with this condition do not need medicines.  When your blood pressure is higher at home than when you are in a health care provider's office, this is called masked hypertension. Most people with this condition may need medicines to control blood pressure.  If you have a high blood pressure reading during one visit or you have normal blood pressure with other risk factors:  You may be asked to return on a different day to have your blood pressure checked again.  You may be asked to monitor your blood pressure at home for 1 week or longer.  If you are diagnosed with hypertension, you may have other blood or imaging  tests to help your health care provider understand your overall risk for other conditions. How is this treated? This condition is treated by making healthy lifestyle changes, such as eating healthy foods, exercising more, and reducing your alcohol intake. Your health care provider may prescribe medicine if lifestyle changes are not enough to get your blood pressure under control, and if:  Your systolic blood  pressure is above 130.  Your diastolic blood pressure is above 80.  Your personal target blood pressure may vary depending on your medical conditions, your age, and other factors. Follow these instructions at home: Eating and drinking  Eat a diet that is high in fiber and potassium, and low in sodium, added sugar, and fat. An example eating plan is called the DASH (Dietary Approaches to Stop Hypertension) diet. To eat this way: ? Eat plenty of fresh fruits and vegetables. Try to fill half of your plate at each meal with fruits and vegetables. ? Eat whole grains, such as whole wheat pasta, brown rice, or whole grain bread. Fill about one quarter of your plate with whole grains. ? Eat or drink low-fat dairy products, such as skim milk or low-fat yogurt. ? Avoid fatty cuts of meat, processed or cured meats, and poultry with skin. Fill about one quarter of your plate with lean proteins, such as fish, chicken without skin, beans, eggs, and tofu. ? Avoid premade and processed foods. These tend to be higher in sodium, added sugar, and fat.  Reduce your daily sodium intake. Most people with hypertension should eat less than 1,500 mg of sodium a day.  Limit alcohol intake to no more than 1 drink a day for nonpregnant women and 2 drinks a day for men. One drink equals 12 oz of beer, 5 oz of wine, or 1 oz of hard liquor. Lifestyle  Work with your health care provider to maintain a healthy body weight or to lose weight. Ask what an ideal weight is for you.  Get at least 30 minutes of exercise that causes your heart to beat faster (aerobic exercise) most days of the week. Activities may include walking, swimming, or biking.  Include exercise to strengthen your muscles (resistance exercise), such as pilates or lifting weights, as part of your weekly exercise routine. Try to do these types of exercises for 30 minutes at least 3 days a week.  Do not use any products that contain nicotine or tobacco, such  as cigarettes and e-cigarettes. If you need help quitting, ask your health care provider.  Monitor your blood pressure at home as told by your health care provider.  Keep all follow-up visits as told by your health care provider. This is important. Medicines  Take over-the-counter and prescription medicines only as told by your health care provider. Follow directions carefully. Blood pressure medicines must be taken as prescribed.  Do not skip doses of blood pressure medicine. Doing this puts you at risk for problems and can make the medicine less effective.  Ask your health care provider about side effects or reactions to medicines that you should watch for. Contact a health care provider if:  You think you are having a reaction to a medicine you are taking.  You have headaches that keep coming back (recurring).  You feel dizzy.  You have swelling in your ankles.  You have trouble with your vision. Get help right away if:  You develop a severe headache or confusion.  You have unusual weakness or numbness.  You feel faint.  You have severe pain in your chest or abdomen.  You vomit repeatedly.  You have trouble breathing. Summary  Hypertension is when the force of blood pumping through your arteries is too strong. If this condition is not controlled, it may put you at risk for serious complications.  Your personal target blood pressure may vary depending on your medical conditions, your age, and other factors. For most people, a normal blood pressure is less than 120/80.  Hypertension is treated with lifestyle changes, medicines, or a combination of both. Lifestyle changes include weight loss, eating a healthy, low-sodium diet, exercising more, and limiting alcohol. This information is not intended to replace advice given to you by your health care provider. Make sure you discuss any questions you have with your health care provider. Document Released: 11/30/2005 Document  Revised: 10/28/2016 Document Reviewed: 10/28/2016 Elsevier Interactive Patient Education  2018 [employer].    How to Take Your Blood Pressure   Blood pressure is a measurement of how strongly your blood is pressing against the walls of your arteries. Arteries are blood vessels that carry blood from your heart throughout your body. Your health care provider takes your blood pressure at each office visit. You can also take your own blood pressure at home with a blood pressure machine. You may need to take your own blood pressure:  To confirm a diagnosis of high blood pressure (hypertension).  To monitor your blood pressure over time.  To make sure your blood pressure medicine is working.  Supplies needed: To take your blood pressure, you will need a blood pressure machine. You can buy a blood pressure machine, or blood pressure monitor, at most drugstores or online. There are several types of home blood pressure monitors. When choosing one, consider the following:  Choose a monitor that has an arm cuff.  Choose a monitor that wraps snugly around your upper arm. You should be able to fit only one finger between your arm and the cuff.  Do not choose a monitor that measures your blood pressure from your wrist or finger.  Your health care provider can suggest a reliable monitor that will meet your needs. How to prepare To get the most accurate reading, avoid the following for 30 minutes before you check your blood pressure:  Drinking caffeine.  Drinking alcohol.  Eating.  Smoking.  Exercising.  Five minutes before you check your blood pressure:  Empty your bladder.  Sit quietly without talking in a dining chair, rather than in a soft couch or armchair.  How to take your blood pressure To check your blood pressure, follow the instructions in the manual that came with your blood pressure monitor. If you have a digital blood pressure monitor, the instructions may be as  follows: 1. Sit up straight. 2. Place your feet on the floor. Do not cross your ankles or legs. 3. Rest your left arm at the level of your heart on a table or desk or on the arm of a chair. 4. Pull up your shirt sleeve. 5. Wrap the blood pressure cuff around the upper part of your left arm, 1 inch (2.5 cm) above your elbow. It is best to wrap the cuff around bare skin. 6. Fit the cuff snugly around your arm. You should be able to place only one finger between the cuff and your arm. 7. Position the cord inside the groove of your elbow. 8. Press the power button. 9. Sit quietly while the cuff inflates and deflates. 10.  Read the digital reading on the monitor screen and write it down (record it). 11. Wait 2-3 minutes, then repeat the steps, starting at step 1.  What does my blood pressure reading mean? A blood pressure reading consists of a higher number over a lower number. Ideally, your blood pressure should be below 120/80. The first ("top") number is called the systolic pressure. It is a measure of the pressure in your arteries as your heart beats. The second ("bottom") number is called the diastolic pressure. It is a measure of the pressure in your arteries as the heart relaxes. Blood pressure is classified into four stages. The following are the stages for adults who do not have a short-term serious illness or a chronic condition. Systolic pressure and diastolic pressure are measured in a unit called mm Hg. Normal  Systolic pressure: below 120.  Diastolic pressure: below 80. Elevated  Systolic pressure: 120-129.  Diastolic pressure: below 80. Hypertension stage 1  Systolic pressure: 130-139.  Diastolic pressure: 80-89. Hypertension stage 2  Systolic pressure: 140 or above.  Diastolic pressure: 90 or above. You can have prehypertension or hypertension even if only the systolic or only the diastolic number in your reading is higher than normal. Follow these instructions at  home:  Check your blood pressure as often as recommended by your health care provider.  Take your monitor to the next appointment with your health care provider to make sure: ? That you are using it correctly. ? That it provides accurate readings.  Be sure you understand what your goal blood pressure numbers are.  Tell your health care provider if you are having any side effects from blood pressure medicine. Contact a health care provider if:  Your blood pressure is consistently high. Get help right away if:  Your systolic blood pressure is higher than 180.  Your diastolic blood pressure is higher than 110. This information is not intended to replace advice given to you by your health care provider. Make sure you discuss any questions you have with your health care provider. Document Released: 05/08/2016 Document Revised: 07/21/2016 Document Reviewed: 05/08/2016 Elsevier Interactive Patient Education  2018 [employer].      Please realize, EXERCISE IS MEDICINE!  -  American Heart Association ( AHA) guidelines for exercise : If you are in good health, without any medical conditions, you should engage in 150-300 minutes of moderate intensity aerobic activity per week.  This means you should be huffing and puffing throughout your workout.   Engaging in regular exercise will improve brain function and memory, as well as improve mood, boost immune system and help with weight management.  As well as the other, more well-known effects of exercise such as decreasing blood sugar levels, decreasing blood pressure,  and decreasing bad cholesterol levels/ increasing good cholesterol levels.     -  The AHA strongly endorses consumption of a diet that contains a variety of foods from all the food categories with an emphasis on fruits and vegetables; fat-free and low-fat dairy products; cereal and grain products; legumes and nuts; and fish, poultry, and/or extra lean meats.    Excessive food  intake, especially of foods high in saturated and trans fats, sugar, and salt, should be avoided.    Adequate water intake of roughly 1/2 of your weight in pounds, should equal the ounces of water per day you should drink.  So for instance, if you're 200 pounds, that would be 100 ounces of water per day.  Mediterranean Diet  Why follow it? Research shows  Those who follow the Mediterranean diet have a reduced risk of heart disease   The diet is associated with a reduced incidence of Parkinson's and Alzheimer's diseases  People following the diet may have longer life expectancies and lower rates of chronic diseases   The Dietary Guidelines for Americans recommends the Mediterranean diet as an eating plan to promote health and prevent disease  What Is the Mediterranean Diet?   Healthy eating plan based on typical foods and recipes of Mediterranean-style cooking  The diet is primarily a plant based diet; these foods should make up a majority of meals   Starches - Plant based foods should make up a majority of meals - They are an important sources of vitamins, minerals, energy, antioxidants, and fiber - Choose whole grains, foods high in fiber and minimally processed items  - Typical grain sources include wheat, oats, barley, corn, brown rice, bulgar, farro, millet, polenta, couscous  - Various types of beans include chickpeas, lentils, fava beans, black beans, white beans   Fruits  Veggies - Large quantities of antioxidant rich fruits & veggies; 6 or more servings  - Vegetables can be eaten raw or lightly drizzled with oil and cooked  - Vegetables common to the traditional Mediterranean Diet include: artichokes, arugula, beets, broccoli, brussel sprouts, cabbage, carrots, celery, collard greens, cucumbers, eggplant, kale, leeks, lemons, lettuce, mushrooms, okra, onions, peas, peppers, potatoes, pumpkin, radishes, rutabaga, shallots, spinach, sweet potatoes, turnips, zucchini -  Fruits common to the Mediterranean Diet include: apples, apricots, avocados, cherries, clementines, dates, figs, grapefruits, grapes, melons, nectarines, oranges, peaches, pears, pomegranates, strawberries, tangerines  Fats - Replace butter and margarine with healthy oils, such as olive oil, canola oil, and tahini  - Limit nuts to no more than a handful a day  - Nuts include walnuts, almonds, pecans, pistachios, pine nuts  - Limit or avoid candied, honey roasted or heavily salted nuts - Olives are central to the [employer] - can be eaten whole or used in a variety of dishes   Meats Protein - Limiting red meat: no more than a few times a month - When eating red meat: choose lean cuts and keep the portion to the size of deck of cards - Eggs: approx. 0 to 4 times a week  - Fish and lean poultry: at least 2 a week  - Healthy protein sources include, chicken, [place], lean beef, lamb - Increase intake of seafood such as tuna, salmon, trout, mackerel, shrimp, scallops - Avoid or limit high fat processed meats such as sausage and bacon  Dairy - Include moderate amounts of low fat dairy products  - Focus on healthy dairy such as fat free yogurt, skim milk, low or reduced fat cheese - Limit dairy products higher in fat such as whole or 2% milk, cheese, ice cream  Alcohol - Moderate amounts of red wine is ok  - No more than 5 oz daily for women (all ages) and men older than age 43  - No more than 10 oz of wine daily for men younger than 62  Other - Limit sweets and other desserts  - Use herbs and spices instead of salt to flavor foods  - Herbs and spices common to the traditional Mediterranean Diet include: basil, bay leaves, chives, cloves, cumin, fennel, garlic, lavender, marjoram, mint, oregano, parsley, pepper, rosemary, sage, savory, sumac, tarragon, thyme   Its not just a diet, its a lifestyle:   The  Mediterranean diet includes lifestyle factors typical of those in the region   Foods,  drinks and meals are best eaten with others and savored  Daily physical activity is important for overall good health  This could be strenuous exercise like running and aerobics  This could also be more leisurely activities such as walking, housework, yard-work, or taking the stairs  Moderation is the key; a balanced and healthy diet accommodates most foods and drinks  Consider portion sizes and frequency of consumption of certain foods   Meal Ideas & Options:   Breakfast:  o Whole wheat toast or whole wheat English muffins with peanut butter & hard boiled egg o Steel cut oats topped with apples & cinnamon and skim milk  o Fresh fruit: banana, strawberries, melon, berries, peaches  o Smoothies: strawberries, bananas, greek yogurt, peanut butter o Low fat greek yogurt with blueberries and granola  o Egg white omelet with spinach and mushrooms o Breakfast couscous: whole wheat couscous, apricots, skim milk, cranberries   Sandwiches:  o Hummus and grilled vegetables (peppers, zucchini, squash) on whole wheat bread   o Grilled chicken on whole wheat pita with lettuce, tomatoes, cucumbers or tzatziki  o Tuna salad on whole wheat bread: tuna salad made with greek yogurt, olives, red peppers, capers, green onions o Garlic rosemary lamb pita: lamb sauted with garlic, rosemary, salt & pepper; add lettuce, cucumber, greek yogurt to pita - flavor with lemon juice and black pepper   Seafood:  o Mediterranean grilled salmon, seasoned with garlic, basil, parsley, lemon juice and black pepper o Shrimp, lemon, and spinach whole-grain pasta salad made with low fat greek yogurt  o Seared scallops with lemon orzo  o Seared tuna steaks seasoned salt, pepper, coriander topped with tomato mixture of olives, tomatoes, olive oil, minced garlic, parsley, green onions and cappers   Meats:  o Herbed greek chicken salad with kalamata olives, cucumber, feta  o Red bell peppers stuffed with spinach, bulgur,  lean ground beef (or lentils) & topped with feta   o Kebabs: skewers of chicken, tomatoes, onions, zucchini, squash  o [place] burgers: made with red onions, mint, dill, lemon juice, feta cheese topped with roasted red peppers  Vegetarian o Cucumber salad: cucumbers, artichoke hearts, celery, red onion, feta cheese, tossed in olive oil & lemon juice  o Hummus and whole grain pita points with a greek salad (lettuce, tomato, feta, olives, cucumbers, red onion) o Lentil soup with celery, carrots made with vegetable broth, garlic, salt and pepper  o Tabouli salad: parsley, bulgur, mint, scallions, cucumbers, tomato, radishes, lemon juice, olive oil, salt and pepper.       Please think seriously about quitting smoking!  This is very important for your health and well being.   Smoking cessation instruction/counseling given:  counseled patient on the dangers of tobacco use, advised patient to stop smoking, and reviewed strategies to maximize success  Discussed with patient that there are multiple treatments to aid in quitting smoking, however I explained none will work unless pt really wants to quit  Please let us know in the future if you are interested and ready to quit.  You can also call 1-800-QUIT-NOW 575-411-8321) for free smoking cessation counseling and support.     Also, please go online to www.heart.org (the American Heart Association website) and search "quit smoking ".     Or try the centers for disease control website at: http://bell-fernandez.com/  Or, go to the "national cancer institute" web site of NIH:  TodayAlert.de  There is a lot of great information on these websites for you to look over.      Want to Quit Smoking? FDA-Approved Products Can Help  Quitting smoking can be hard, but it is possible. In fact, every time you put out a cigarette is a new chance to try quitting  again, according to the U.S. Food and Drug Administrations newest tobacco education campaign, Every Try Counts.   If you want to quit--almost 70 percent of adult smokers say they do--you may want to use a smoking cessation product proven to help. Data has shown that using FDA-approved cessation medicine can double your chance of quitting successfully.  Some products contain nicotine as an active ingredient and others do not. These products include over-the-counter (OTC) options like skin patches, lozenges, and gum, as well as prescription medicines.  Smoking cessation products are intended to help you quit smoking. They are regulated through the [facility] for Drug Evaluation and Research, which ensures that the products are safe and effective and that their benefits outweigh any known associated risks.  The Benefits of Quitting Smoking No matter how much you smoke--or for how long--quitting will benefit you.  Not only will you lower your risk of getting various cancers, including lung cancer, youll also reduce your chances of having heart disease, a stroke, emphysema, and other serious diseases. Quitting also will lower the risk of heart disease and lung cancer in nonsmokers who otherwise would be exposed to your secondhand smoke.  Although there are benefits to quitting at any age, it is important to quit as soon as possible so your body can begin to heal from the damage caused by smoking. For instance, 12 hours after you quit smoking the carbon monoxide level in your blood drops to normal. Carbon monoxide is harmful because it displaces oxygen in the blood and deprives your heart, brain, and other vital organs of oxygen.  What To Know About Smoking Cessation Products Understanding how smoking cessation products work--and what side effects they may cause--can help you determine which product may be best for you.  If youre considering one of these products, reading labels and talking to  your pharmacist and other health care providers are good first steps to take.  You also can check the FDAs website for more information on each product at Drugs@FDA , where you can search for each product by name.  And remember to weigh each products benefits and risks, among other considerations.  About Nicotine Replacement Therapy (NRT) Nicotine is the substance primarily responsible for causing addiction to tobacco products. Tobacco users who are addicted to nicotine are used to having nicotine in their bodies.  As you try to quit smoking, you may have symptoms of nicotine withdrawal. When you quit, this withdrawal may cause symptoms like cravings, or urges, to smoke; depression; trouble sleeping; irritability; anxiety; and increased appetite.  Nicotine withdrawal can discourage some smokers from continuing with a quit attempt. But the FDA has approved several smoking cessation products designed to help users gradually withdraw from smoking (that is, wean themselves from smoking) by using specific amounts of nicotine that decrease over time. This type of product is called a nicotine replacement therapy or NRT. It supplies nicotine in controlled amounts while sparing you from other chemicals found in tobacco products.  NRTs are available over the counter and by prescription. You should generally use them only for a short time to help you manage nicotine cravings and withdrawal. However, the FDA recognizes that  some people may need to use these products longer to stay smoke-free. Talk to your health care provider to determine the best course of treatment for you.  Over-the-counter NRTs are approved for sale to people age 110 and older. They are available under various brand names and sometimes as generic products. They include:  - Skin patches (also called transdermal nicotine patches). These patches are placed on the skin, similar to how you would apply an adhesive bandage. - Chewing gum  (also called nicotine gum). This gum must be chewed according to the labeled instructions to be effective. - Lozenges (also called nicotine lozenges). You use these products by dissolving them in your mouth. For over-the-counter products, its important to follow the instructions on the Drug Facts Label (DFL) and to read the enclosed Users Guide for complete directions and other important information. Ask your health care provider if you have questions.  Currently, prescription nicotine replacement therapy is available only under the brand name Nicotrol, and is available both as a nasal spray and an oral inhaler. The products are FDA-approved only for use by adults.  If you are under age 70 and want to quit smoking, talk to a health care professional about whether you should use nicotine replacement therapies.  Important Advice for People Considering Nicotine Replacement Therapy Women who are pregnant or breastfeeding should talk to their health care providers and use nicotine replacement products only if the health care providers approve.  Also talk to your health care provider before using these products if you have:  diabetes, heart disease, asthma, or stomach ulcers; had a recent heart attack; high blood pressure that is not controlled with medicine; a history of irregular heartbeat; or been prescribed medication to help you quit smoking. If you take prescription medication for depression or asthma, tell your health care provider if you are quitting smoking because he or she may need to change your prescription dose.  Stop using a nicotine replacement product and call your health care professional if you have any of the following symptoms: nausea; dizziness; weakness; vomiting; fast or irregular heartbeat; mouth problems with the lozenge or gum; or redness or swelling of the skin around the patch that does not go away.  About Prescription Cessation Medicines Without  Nicotine  The FDA has approved two smoking cessation products that do not contain nicotine. They are Chantix (varenicline tartrate) and Zyban (buproprion hydrochloride). Both are available in tablet form and by prescription only.  Chantix acts at sites in the brain affected by nicotine by reducing the rewarding effects of nicotine. The precise way that Zyban helps with smoking cessation is unknown.  As with other prescription products, the FDA has evaluated these medicines and found that the benefits outweigh the risks. For users taking these products, risks include changes in behavior, depressed mood, hostility, aggression, and suicidal thoughts or actions.  The most common side effects of Chantix include nausea; constipation; gas; vomiting; and trouble sleeping or vivid, unusual, or strange dreams. Chantix also may change how you react to alcohol, so talk to your health care provider about your drinking habits (if you drink alcohol) and whether these habits need to change. Chantix is not recommended for people under the age of 45.  The most commonly observed side effects consistently associated with the use of Zyban are dry mouth and insomnia.  Because Zyban contains the same active ingredient as the antidepressant Wellbutrin (bupropion), the FDA encourages people who use Zyban--and those who are considering it--to talk to  their health care providers about the risks of treatment with antidepressant medicines. Zyban has not been studied in children under the age of 74 and is not approved for use in children and teenagers.  Note: If your health care provider prescribes Chantix or Zyban, please read the products patient medication guide in its entirety. These guides offer important information on side effects, risks, warnings, product ingredients, and what you should talk about with your health care provider before taking the products.  Finally, if you ever have any side effects related to any  smoking cessation products, or have any other problems related to your treatment, the FDA would like to hear from you. Please consider making a voluntary and confidential report to the [facility] MedWatch program.  Updated: November 23, 2016

## 2018-11-03 NOTE — Progress Notes (Signed)
New patient office visit note:  Impression and Recommendations:    1. Establishing care with new doctor, encounter for   2. MITRAL REGURGITATION- with heart murmur   3. Chronic obstructive pulmonary disease, unspecified COPD type (HCC)   4. Essential hypertension   5. Hyperlipidemia, unspecified hyperlipidemia type   6. Current smoker-   1/2 ppd for 4 yrs or so   7. Tobacco abuse counseling     - Extensive discussion held with patient regarding establishing as a new patient.  Discussed policies and practices here at the clinic, and answered all questions about care team and health management during appointment.  1. Specialty Care - Strongly encouraged patient to continue to follow up with all established specialists as recommended (psychiatry, etc).  - Keep appointment with [facility] on Monday; consult for chronic cough that has not resolved.  2. Elevated Blood Pressure on Intake - Ambulatory BP monitoring encouraged. Keep log and bring in next OV. - Will continue to monitor.   Return in 4 months for re-evaluation.  - Check BP 3-4 times per week.  Patient knows to call if he notices trends of high blood pressure or has questions.  3. BMI Counseling Explained to patient what BMI refers to, and what it means medically.    Told patient to think about it as a "medical risk stratification measurement" and how increasing BMI is associated with increasing risk/ or worsening state of various diseases such as hypertension, hyperlipidemia, diabetes, premature OA, depression etc.  American Heart Association guidelines for healthy diet, basically Mediterranean diet, and exercise guidelines of 30 minutes 5 days per week or more discussed in detail.  Health counseling performed.  All questions answered.  4. Lifestyle & Preventative Health Maintenance - Advised patient to continue working toward exercising to improve overall mental, physical, and emotional health.    -  Reviewed the "spokes of the wheel" of mood and health management.  Stressed the importance of ongoing prudent habits, including regular exercise, appropriate sleep hygiene, healthful dietary habits, and prayer/meditation to relax.  - Encouraged patient to engage in daily physical activity, especially a formal exercise routine.  Recommended that the patient eventually strive for at least 150 minutes of moderate cardiovascular activity per week according to guidelines established by the [facility].   - Healthy dietary habits encouraged, including low-carb, and high amounts of lean protein in diet.   - Patient should also consume adequate amounts of water.   Education and routine counseling performed. Handouts provided.  5. Follow-Up - Prescriptions refilled today. - Re-check fasting lab work as recommended. - Otherwise, continue to return for CPE and chronic follow-up as scheduled.   - Patient knows to call in sooner if desired to address acute concerns.   Do not remove these below:    Gross side effects, risk and benefits, and alternatives of medications discussed with patient.  Patient is aware that all medications have potential side effects and we are unable to predict every side effect or drug-drug interaction that may occur.  Expresses verbal understanding and consents to current therapy plan and treatment regimen.  Return for Chronic OV w me 37mo & FBW 3d prior- bring BP readings; sooner  if BP UP.  Please see AVS handed out to patient at the end of our visit for further patient instructions/ counseling done pertaining to today's office visit.    Note:  This document was prepared using Dragon voice recognition software and may include unintentional dictation  errors.   This document serves as a record of services personally performed by Thomasene Loteborah Brenee Gajda, DO. It was created on her behalf by Peggye FothergillKatherine Galloway, a trained medical scribe. The creation of this record is based on the scribe's  personal observations and the provider's statements to them.   I have reviewed the above medical documentation for accuracy and completeness and I concur.  Thomasene Loteborah Katheren Jimmerson, DO 11/07/2018 6:53 AM       -----------------------------------------------------------------------------    Subjective:    Chief complaint:   Chief Complaint  Patient presents with  . Establish Care    HPI: Cody Perez is a pleasant 67 y.o. male who [facility] to review their medical history with me and establish care.   I asked the patient to review their chronic problem list with me to ensure everything was updated and accurate.    All recent office visits with other providers, any medical records that patient brought in etc  - I reviewed today.     We asked pt to get us their medical records from [facility] providers/ specialists that they had seen within the past 3-5 years- if they are in private practice and/or do not work for [facility], [facility], [facility], [facility] or [facility] owned practice.  Told them to call their specialists to clarify this if they are not sure.    His former provider, Dr. Tawanna Coolerodd, retired; used to go to [facility].  Social History Lives at home with wife. 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 Has written 6 novels, 2 published. Has had 32 plays published. 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 Retired at age 67.  Is writing and working at home.  Lives with wife Cody Perez. Wife of 35 years. Two children, aged 69 and 1134. No grandkids.  They have a corgi-spaniel mix at home.  Tobacco Use Current smoker, for approximately 3 years. Picked up smoking when he retired. States he picked it up as "kind of an oral thing."  Alcohol Use Drinks red wine occassionally.  Caffeine Use Drinks  two big cups of coffee per day.  Physical Activity Treadmill 2 days per week for 15 minutes.  Family History Notes that he's adopted, so he knows nothing about his natural parents.  Has a biological sister and a biological brother.    His sister, Cody Perez, has had stomach issues recently.  Cody Perez, his half-brother, may have early onset Alzheimer's. He is age 69.  Surgical History Surgery for ankle with Dr. Lajoyce Cornersuda in 2012.  Notes surgery for ruptured quad, put back together about 5 years ago. Has trouble walking downhill after his quad surgery.  Past Medical History Has had a colonoscopy.  No other specialists at present. Has been seen by behavioral health in the past.  - Blood Pressure Elevated on Intake Runs around 130/80 at home.  Patient states he never checks his blood pressure at home.  - Establishing with Pulmonology Patient going to [facility] on Monday to get checked out/have a consultation and become established as patient.  Saw a psychiatrist in the past; had very high blood pressure, "190/60 or something" two years ago.  Was placed on blood pressure medicine, and the blood pressure went down considerably, to 130/90 per patient.  Notes it was stable the last time he visited Dr. Tawanna Coolerodd.  -  Bipolar Disorder Saw Dr. Nolen Mu in the past for bipolar disorder. He is managed on medications for this.  Abilify, Klonopin, Aricept, Lamictal, Risperdal, Seroquel.  - Heart Murmur Was told he has mitral prolapse, and notes that it was already checked out by Dr. Tawanna Cooler.  Patient says he was told that the murmur is not a big issue.    Wt Readings from Last 3 Encounters:  11/03/18 182 lb 8 oz (82.8 kg)  09/20/18 188 lb (85.3 kg)  05/05/17 184 lb (83.5 kg)   BP Readings from Last 3 Encounters:  11/03/18 (!) 146/88  09/20/18 130/80  05/05/17 134/80   Pulse Readings from Last 3 Encounters:  11/03/18 90  09/20/18 99  04/09/16 69   BMI Readings from Last 3 Encounters:    11/03/18 28.58 kg/m  09/20/18 29.44 kg/m  05/05/17 27.98 kg/m    Patient Care Team    Relationship Specialty Notifications Start End  Cody Pee, MD PCP - General   11/26/10     Patient Active Problem List   Diagnosis Date Noted  . Essential hypertension 04/15/2016    Priority: High  . Hyperlipidemia 03/03/2008    Priority: High  . COPD (chronic obstructive pulmonary disease) (HCC) 03/03/2008    Priority: High  . DEMENTIA 03/03/2008    Priority: Medium  . Current smoker-   1/2 ppd for 4 yrs or so 11/03/2018  . Onychomycosis of toenail 11/30/2013  . MITRAL REGURGITATION- with heart murmur 05/18/2008  . 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 ANXIETY 03/03/2008  . DEPRESSION 03/03/2008  . PERIPHERAL NEUROPATHY 03/03/2008       As reported by pt:  Past Medical History:  Diagnosis Date  . Hyperlipidemia   . Hypertension      History reviewed. No pertinent surgical history.   Family History  Adopted: Yes     Social History   Substance and Sexual Activity  Drug Use Never     Social History   Substance and Sexual Activity  Alcohol Use Yes  . Alcohol/week: 2.0 standard drinks  . Types: 2 Standard drinks or equivalent per week     Social History   Tobacco Use  Smoking Status Current Every Day Smoker  . Packs/day: 0.50  . Years: 3.00  . Pack years: 1.50  . Types: Cigarettes  . Last attempt to quit: 02/21/2017  . Years since quitting: 1.7  Smokeless Tobacco Never Used     Current Meds  Medication Sig  . ABILIFY 10 MG tablet Take 5 mg by mouth daily.   Marland Kitchen aspirin 325 MG tablet Take 325 mg by mouth daily.  . clonazePAM (KLONOPIN) 1 MG tablet Take 1 mg by mouth 2 (two) times daily.   Marland Kitchen donepezil (ARICEPT) 10 MG tablet 1 daily (Patient taking differently: Take 10 mg by mouth at bedtime. 1 daily)  . lamoTRIgine (LAMICTAL) 200 MG tablet Take 200 mg by mouth daily.   Marland Kitchen lisinopril (PRINIVIL,ZESTRIL) 20 MG tablet TAKE 1  TABLET BY MOUTH EVERY DAY  . Multiple Vitamin (MULTIVITAMIN) capsule Take 1 capsule by mouth daily.  . QUEtiapine (SEROQUEL) 300 MG tablet Take 300 mg by mouth at bedtime.   . risperiDONE (RISPERDAL) 3 MG tablet Take 3 mg by mouth daily.   . simvastatin (ZOCOR) 10 MG tablet TAKE 1 TABLET BY MOUTH EVERYDAY AT BEDTIME  . simvastatin (ZOCOR) 10 MG tablet TAKE 1 TABLET (10 MG TOTAL) BY MOUTH AT BEDTIME.    Allergies: Patient has  no known allergies.   Review of Systems  Constitutional: Negative for chills, diaphoresis, fever, malaise/fatigue and weight loss.  HENT: Negative for congestion, sore throat and tinnitus.   Eyes: Negative for blurred vision, double vision and photophobia.  Respiratory: Positive for cough (chronic) and wheezing (chronic).   Cardiovascular: Negative for chest pain and palpitations.  Gastrointestinal: Negative for blood in stool, diarrhea, nausea and vomiting.  Genitourinary: Negative for dysuria, frequency and urgency.  Musculoskeletal: Negative for joint pain and myalgias.  Skin: Negative for itching and rash.  Neurological: Negative for dizziness, focal weakness, weakness and headaches.  Endo/Heme/Allergies: Negative for environmental allergies and polydipsia. Does not bruise/bleed easily.  Psychiatric/Behavioral: Negative for depression and memory loss. The patient is not nervous/anxious and does not have insomnia.       Objective:   Blood pressure (!) 146/88, pulse 90, temperature 98.9 F (37.2 C), height 5\' 7"  (1.702 m), weight 182 lb 8 oz (82.8 kg), SpO2 96 %. Body mass index is 28.58 kg/m. General: Well Developed, well nourished, and in no acute distress.  Neuro: Alert and oriented x3, extra-ocular muscles intact, sensation grossly intact.  HEENT:[place]/AT, PERRLA, neck supple, No carotid bruits Skin: no gross rashes  Cardiac: Regular rate and rhythm, murmur appreciated Respiratory: Essentially clear to auscultation bilaterally. Not using accessory  muscles, speaking in full sentences.  Abdominal: not grossly distended Musculoskeletal: Ambulates w/o diff, FROM * 4 ext.  Vasc: less 2 sec cap RF, warm and pink  Psych:  No HI/SI, judgement and insight good, Euthymic mood. Full Affect.    Recent Results (from the past 2160 hour(s))  Lipid panel     Status: None   Collection Time: 09/20/18 11:51 AM  Result Value Ref Range   Cholesterol 143 0 - 200 mg/dL    Comment: ATP III Classification       Desirable:  < 200 mg/dL               Borderline High:  200 - 239 mg/dL          High:  > = 409 mg/dL   Triglycerides 81.1 0.0 - 149.0 mg/dL    Comment: Normal:  <914 mg/dLBorderline High:  150 - 199 mg/dL   HDL 78.29 >56.21 mg/dL   VLDL 8.6 0.0 - 30.8 mg/dL   LDL Cholesterol 51 0 - 99 mg/dL   Total CHOL/HDL Ratio 2     Comment:                Men          Women1/2 Average Risk     3.4          3.3Average Risk          5.0          4.42X Average Risk          9.6          7.13X Average Risk          15.0          11.0                       NonHDL 59.51     Comment: NOTE:  Non-HDL goal should be 30 mg/dL higher than patient's LDL goal (i.e. LDL goal of < 70 mg/dL, would have non-HDL goal of < 100 mg/dL)  Hepatic function panel     Status: None   Collection Time: 09/20/18 11:51 AM  Result Value Ref  Range   Total Bilirubin 0.5 0.2 - 1.2 mg/dL   Bilirubin, Direct 0.1 0.0 - 0.3 mg/dL   Alkaline Phosphatase 108 39 - 117 U/L   AST 27 0 - 37 U/L   ALT 16 0 - 53 U/L   Total Protein 7.0 6.0 - 8.3 g/dL   Albumin 4.1 3.5 - 5.2 g/dL  CBC with Differential/Platelet     Status: Abnormal   Collection Time: 09/20/18 11:51 AM  Result Value Ref Range   WBC 5.5 4.0 - 10.5 K/uL   RBC 3.92 (L) 4.22 - 5.81 Mil/uL   Hemoglobin 14.3 13.0 - 17.0 g/dL   HCT 16.1 09.6 - 04.5 %   MCV 102.0 (H) 78.0 - 100.0 fl   MCHC 35.6 30.0 - 36.0 g/dL   RDW 40.9 81.1 - 91.4 %   Platelets 272.0 150.0 - 400.0 K/uL   Neutrophils Relative % 58.9 43.0 - 77.0 %   Lymphocytes  Relative 32.3 12.0 - 46.0 %   Monocytes Relative 7.2 3.0 - 12.0 %   Eosinophils Relative 0.9 0.0 - 5.0 %   Basophils Relative 0.7 0.0 - 3.0 %   Neutro Abs 3.3 1.4 - 7.7 K/uL   Lymphs Abs 1.8 0.7 - 4.0 K/uL   Monocytes Absolute 0.4 0.1 - 1.0 K/uL   Eosinophils Absolute 0.0 0.0 - 0.7 K/uL   Basophils Absolute 0.0 0.0 - 0.1 K/uL  TSH     Status: None   Collection Time: 09/20/18 11:51 AM  Result Value Ref Range   TSH 0.76 0.35 - 4.50 uIU/mL  Basic metabolic panel     Status: Abnormal   Collection Time: 09/20/18 11:51 AM  Result Value Ref Range   Sodium 132 (L) 135 - 145 mEq/L   Potassium 4.6 3.5 - 5.1 mEq/L   Chloride 97 96 - 112 mEq/L   CO2 27 19 - 32 mEq/L   Glucose, Bld 112 (H) 70 - 99 mg/dL   BUN 7 6 - 23 mg/dL   Creatinine, Ser 7.82 0.40 - 1.50 mg/dL   Calcium 9.5 8.4 - 95.6 mg/dL   GFR 21.30 >86.57 mL/min  POCT urinalysis dipstick     Status: None   Collection Time: 09/20/18  3:10 PM  Result Value Ref Range   Color, UA yellow    Clarity, UA clear    Glucose, UA Negative Negative   Bilirubin, UA n    Ketones, UA n    Spec Grav, UA 1.015 1.010 - 1.025   Blood, UA 3+    pH, UA 6.5 5.0 - 8.0   Protein, UA Negative Negative   Urobilinogen, UA 0.2 0.2 or 1.0 E.U./dL   Nitrite, UA n    Leukocytes, UA Negative Negative   Appearance clear    Odor n

## 2018-11-07 ENCOUNTER — Ambulatory Visit (INDEPENDENT_AMBULATORY_CARE_PROVIDER_SITE_OTHER): Payer: Medicare Other | Admitting: Internal Medicine

## 2018-11-07 ENCOUNTER — Encounter: Payer: Self-pay | Admitting: Internal Medicine

## 2018-11-07 VITALS — BP 134/74 | HR 120 | Ht 69.0 in | Wt 186.0 lb

## 2018-11-07 DIAGNOSIS — I10 Essential (primary) hypertension: Secondary | ICD-10-CM | POA: Diagnosis not present

## 2018-11-07 DIAGNOSIS — R05 Cough: Secondary | ICD-10-CM

## 2018-11-07 DIAGNOSIS — J449 Chronic obstructive pulmonary disease, unspecified: Secondary | ICD-10-CM

## 2018-11-07 DIAGNOSIS — F17200 Nicotine dependence, unspecified, uncomplicated: Secondary | ICD-10-CM | POA: Diagnosis not present

## 2018-11-07 DIAGNOSIS — R058 Other specified cough: Secondary | ICD-10-CM

## 2018-11-07 MED ORDER — IRBESARTAN 150 MG PO TABS: 150.0000 mg | ORAL_TABLET | Freq: Every day | ORAL | 2 refills | Status: DC

## 2018-11-07 NOTE — Progress Notes (Signed)
Cody Perez, male    DOB: March 29, 1951,     MRN: [id number]     Brief patient profile:  56 yowm active smoker from [place] Al ran track in college no respiratory problems until indolent onset persistent daily cough x sept 2019 referred to pulmonary clinic 11/07/2018 by Dr Sharee Holster at [facility]    History of Present Illness  11/07/2018  Pulmonary/ 1st office eval/Nolyn Swab  Chief Complaint  Patient presents with  . pulmonary consult    Referred by Dr Tawanna Cooler. Pt c/o cough x 2 months- non prod.    Dyspnea:  Not limited by breathing from desired activities  / still does treadmill x 15 min / day at flat grade "fast walk" but does not track speed  Cough:  Day > noct and dry  Sleep: flat ok / 1 pillow SABA use: no   No obvious day to day or daytime variability or assoc excess/ purulent sputum or mucus plugs or hemoptysis or cp or chest tightness, subjective wheeze or overt sinus or hb symptoms.   Sleeping  without nocturnal  or early am exacerbation  of respiratory  c/o's or need for noct saba. Also denies any obvious fluctuation of symptoms with weather or environmental changes or other aggravating or alleviating factors except as outlined above   No unusual exposure hx or h/o childhood pna/ asthma or knowledge of premature birth.  Current Allergies, Complete Past Medical History, Past Surgical History, Family History, and Social History were reviewed in [employer] record.  ROS  The following are not active complaints unless bolded Hoarseness, sore throat, dysphagia, dental problems, itching, sneezing,  nasal congestion or discharge of excess mucus or purulent secretions, ear ache,   fever, chills, sweats, unintended wt loss or wt gain, classically pleuritic or exertional cp,  orthopnea pnd or arm/hand swelling  or leg swelling, presyncope, palpitations, abdominal pain, anorexia, nausea, vomiting, diarrhea  or change in bowel habits or change in bladder habits, change  in stools or change in urine, dysuria, hematuria,  rash, arthralgias, visual complaints, headache, numbness, weakness or ataxia or problems with walking or coordination,  change in mood or  memory.           Past Medical History:  Diagnosis Date  . Hyperlipidemia   . Hypertension     Outpatient Medications Prior to Visit  Medication Sig Dispense Refill  . ABILIFY 10 MG tablet Take 5 mg by mouth daily.     Marland Kitchen aspirin 325 MG tablet Take 325 mg by mouth daily.    . clonazePAM (KLONOPIN) 1 MG tablet Take 1 mg by mouth 2 (two) times daily.     Marland Kitchen donepezil (ARICEPT) 10 MG tablet 1 daily (Patient taking differently: Take 10 mg by mouth at bedtime. 1 daily) 100 tablet 3  . lamoTRIgine (LAMICTAL) 200 MG tablet Take 200 mg by mouth daily.     Marland Kitchen lisinopril (PRINIVIL,ZESTRIL) 20 MG tablet TAKE 1 TABLET BY MOUTH EVERY DAY 90 tablet 4  . Multiple Vitamin (MULTIVITAMIN) capsule Take 1 capsule by mouth daily.    . QUEtiapine (SEROQUEL) 300 MG tablet Take 300 mg by mouth at bedtime.     . risperiDONE (RISPERDAL) 3 MG tablet Take 3 mg by mouth daily.     . simvastatin (ZOCOR) 10 MG tablet TAKE 1 TABLET BY MOUTH EVERYDAY AT BEDTIME 100 tablet 2  . simvastatin (ZOCOR) 10 MG tablet TAKE 1 TABLET (10 MG TOTAL) BY MOUTH AT BEDTIME. 90 tablet  3      Objective:     BP 134/74 (BP Location: Left Arm, Cuff Size: Normal)   Pulse (!) 120   Ht 5\' 9"  (1.753 m)   Wt 186 lb (84.4 kg)   SpO2 93%   BMI 27.47 kg/m   SpO2: 93 %  RA     HEENT: nl dentition / oropharynx. Nl external ear canals without cough reflex -  Mild bilateral non-specific turbinate edema     NECK :  without JVD/Nodes/TM/ nl carotid upstrokes bilaterally   LUNGS: no acc muscle use,  Mild barrel  contour chest wall with bilateral  Distant bs s audible wheeze and  without cough on insp or exp maneuver and mild  Hyperresonant  to  percussion bilaterally     CV:  RRR  no s3 or murmur or increase in P2, and no edema   ABD:  soft and  nontender with pos end  insp Hoover's  in the supine position. No bruits or organomegaly appreciated, bowel sounds nl  MS:   Nl gait/  ext warm without deformities, calf tenderness, cyanosis or clubbing No obvious joint restrictions   SKIN: warm and dry without lesions    NEURO:  alert, approp, nl sensorium with  no motor or cerebellar deficits apparent.        I personally reviewed images and agree with radiology impression as follows:  CXR:   09/20/18  No acute abnormality noted.       Assessment   COPD GOLD II active smoker Spirometry 11/07/2018  FEV1 1.9 (58%)  Ratio 64 s prior rx/ classic curvature      I reviewed the Fletcher curve with the patient that basically indicates  if you quit smoking when your best day FEV1 is still well preserved (as is clearly  the case here)  it is highly unlikely you will progress to severe disease and informed the patient there was  no medication on the market that has proven to alter the curve/ its downward trajectory  or the likelihood of progression of their disease(unlike other chronic medical conditions such as atheroclerosis where we do think we can change the natural hx with risk reducing meds)    Therefore stopping smoking and maintaining abstinence are  the most important aspects of his care, not choice of inhalers or for that matter, doctors.   Treatment other than smoking cessation  is entirely directed by severity of symptoms and focused also on reducing exacerbations, not attempting to change the natural history of the disease.    Since minimal symptoms and no tendency to aecopd >>  No need for maint rx at this point   Upper airway cough syndrome Cough is not at all c/w CB but more likely this is upper airway cough syndrome (previously labeled PNDS),  is so named because it's frequently impossible to sort out how much is  CR/sinusitis with freq throat clearing (which can be related to primary GERD)   vs  causing  secondary (" extra  esophageal")  GERD from wide swings in gastric pressure that occur with throat clearing, often  promoting self use of mint and menthol lozenges that reduce the lower esophageal sphincter tone and exacerbate the problem further in a cyclical fashion.   These are the same pts (now being labeled as having "irritable larynx syndrome" by some cough centers) who not infrequently have a history of having failed to tolerate ace inhibitors(as may prove to be the case here) ,  dry powder inhalers or biphosphonates or report having atypical/extraesophageal reflux symptoms that don't respond to standard doses of PPI  and are easily confused as having aecopd or asthma flares by even experienced allergists/ pulmonologists (myself included).    >>> first step is try off acei, then regroup    Essential hypertension In the best review of chronic cough to date ( NEJM 2016 375 [id number]) ,  ACEi are now felt to cause cough in up to  20% of pts which is a 4 fold increase from previous reports and does not include the variety of non-specific complaints we see in pulmonary clinic in pts on ACEi but previously attributed to another dx like  Copd/asthma and  include PNDS, throat and chest congestion, "bronchitis", unexplained dyspnea and noct "strangling" sensations, and hoarseness, but also  atypical /refractory GERD symptoms like dysphagia and "bad heartburn"   The only way I know  to prove this is not an "ACEi Case" is a trial off ACEi x a minimum of 6 weeks then regroup.    >>> Try avapro 150 mg daily then regroup in 6 weeks if not all better   Current smoker-   1/2 ppd for 4 yrs or so 4-5 min discussion re active cigarette smoking in addition to office E&M  Ask about tobacco use:   ongoing Advise quitting  > 3 min Discussed the risks and costs (both direct and indirect)  of smoking relative to the benefits of quitting but patient unwilling to commit at this point to a specific quit date.   Assess willingness:   Not committed at this point Assist in quit attempt:  Per PCP when ready Arrange follow up:   Follow up per Primary Care planned       Total time devoted to counseling  > 50 % of initial 60 min office visit:  review case with pt/ discussion of options/alternatives/ personally creating written customized instructions  in presence of pt  then going over those specific  Instructions directly with the pt including how to use all of the meds but in particular covering each new medication in detail and the difference between the maintenance= "automatic" meds and the prns using an action plan format for the latter (If this problem/symptom => do that organization reading Left to right).  Please see AVS from this visit for a full list of these instructions which I personally wrote for this pt and  are unique to this visit.          Sandrea HughsMichael Kashif Pooler, MD 11/07/2018

## 2018-11-07 NOTE — Patient Instructions (Addendum)
Stop lisinopril immediately   Avapro 150 mg one daily - call if any problems at all purchasing or taking this medication   Call me if your want to be screened for lung cancer yearly for the next 3 years and I will enter you into the program  Best way to reduce risk of lung cancer though is to stop smoking now    Although I certainly  don't endorse regular use of e cigs   they can considered a quick "one-way bridge" off all tobacco products and that he only use the commercial brands in the very short term and stop immediately if any resp symptoms worsen    Follow up here is as needed if you don't get 100% better off lisinopril

## 2018-11-08 ENCOUNTER — Encounter: Payer: Self-pay | Admitting: Internal Medicine

## 2018-11-08 DIAGNOSIS — R05 Cough: Secondary | ICD-10-CM | POA: Insufficient documentation

## 2018-11-08 DIAGNOSIS — R058 Other specified cough: Secondary | ICD-10-CM | POA: Insufficient documentation

## 2018-11-08 NOTE — Assessment & Plan Note (Signed)
4-5 min discussion re active cigarette smoking in addition to office E&M  Ask about tobacco use:   ongoing Advise quitting   > 3 min Discussed the risks and costs (both direct and indirect)  of smoking relative to the benefits of quitting but patient unwilling to commit at this point to a specific quit date.  Assess willingness:  Not committed at this point Assist in quit attempt:  Per PCP when ready Arrange follow up:   Follow up per Primary Care planned      Total time devoted to counseling  > 50 % of initial 60 min office visit:  review case with pt/ discussion of options/alternatives/ personally creating written customized instructions  in presence of pt  then going over those specific  Instructions directly with the pt including how to use all of the meds but in particular covering each new medication in detail and the difference between the maintenance= "automatic" meds and the prns using an action plan format for the latter (If this problem/symptom => do that organization reading Left to right).  Please see AVS from this visit for a full list of these instructions which I personally wrote for this pt and  are unique to this visit.      

## 2018-11-08 NOTE — Assessment & Plan Note (Signed)
In the best review of chronic cough to date ( NEJM 2016 375 [phone]) ,  ACEi are now felt to cause cough in up to  20% of pts which is a 4 fold increase from previous reports and does not include the variety of non-specific complaints we see in pulmonary clinic in pts on ACEi but previously attributed to another dx like  Copd/asthma and  include PNDS, throat and chest congestion, "bronchitis", unexplained dyspnea and noct "strangling" sensations, and hoarseness, but also  atypical /refractory GERD symptoms like dysphagia and "bad heartburn"   The only way I know  to prove this is not an "ACEi Case" is a trial off ACEi x a minimum of 6 weeks then regroup.    >>> Try avapro 150 mg daily then regroup in 6 weeks if not all better

## 2018-11-08 NOTE — Assessment & Plan Note (Signed)
Spirometry 11/07/2018  FEV1 1.9 (58%)  Ratio 64 s prior rx/ classic curvature      I reviewed the Fletcher curve with the patient that basically indicates  if you quit smoking when your best day FEV1 is still well preserved (as is clearly  the case here)  it is highly unlikely you will progress to severe disease and informed the patient there was  no medication on the market that has proven to alter the curve/ its downward trajectory  or the likelihood of progression of their disease(unlike other chronic medical conditions such as atheroclerosis where we do think we can change the natural hx with risk reducing meds)    Therefore stopping smoking and maintaining abstinence are  the most important aspects of his care, not choice of inhalers or for that matter, doctors.   Treatment other than smoking cessation  is entirely directed by severity of symptoms and focused also on reducing exacerbations, not attempting to change the natural history of the disease.    Since minimal symptoms and no tendency to aecopd >>  No need for maint rx at this point

## 2018-11-08 NOTE — Assessment & Plan Note (Signed)
Cough is not at all c/w CB but more likely this is upper airway cough syndrome (previously labeled PNDS),  is so named because it's frequently impossible to sort out how much is  CR/sinusitis with freq throat clearing (which can be related to primary GERD)   vs  causing  secondary (" extra esophageal")  GERD from wide swings in gastric pressure that occur with throat clearing, often  promoting self use of mint and menthol lozenges that reduce the lower esophageal sphincter tone and exacerbate the problem further in a cyclical fashion.   These are the same pts (now being labeled as having "irritable larynx syndrome" by some cough centers) who not infrequently have a history of having failed to tolerate ace inhibitors(as may prove to be the case here) ,  dry powder inhalers or biphosphonates or report having atypical/extraesophageal reflux symptoms that don't respond to standard doses of PPI  and are easily confused as having aecopd or asthma flares by even experienced allergists/ pulmonologists (myself included).    >>> first step is try off acei, then regroup

## 2018-11-29 DIAGNOSIS — F3176 Bipolar disorder, in full remission, most recent episode depressed: Secondary | ICD-10-CM | POA: Diagnosis not present

## 2019-01-31 ENCOUNTER — Other Ambulatory Visit: Payer: Self-pay | Admitting: Internal Medicine

## 2019-05-09 ENCOUNTER — Other Ambulatory Visit (INDEPENDENT_AMBULATORY_CARE_PROVIDER_SITE_OTHER): Payer: Medicare Other

## 2019-05-09 ENCOUNTER — Ambulatory Visit: Payer: Medicare Other | Admitting: Internal Medicine

## 2019-05-09 ENCOUNTER — Other Ambulatory Visit: Payer: Self-pay

## 2019-05-09 DIAGNOSIS — E559 Vitamin D deficiency, unspecified: Secondary | ICD-10-CM

## 2019-05-09 DIAGNOSIS — R7309 Other abnormal glucose: Secondary | ICD-10-CM | POA: Diagnosis not present

## 2019-05-09 DIAGNOSIS — E785 Hyperlipidemia, unspecified: Secondary | ICD-10-CM

## 2019-05-09 DIAGNOSIS — I10 Essential (primary) hypertension: Secondary | ICD-10-CM | POA: Diagnosis not present

## 2019-05-09 DIAGNOSIS — I080 Rheumatic disorders of both mitral and aortic valves: Secondary | ICD-10-CM | POA: Diagnosis not present

## 2019-05-10 LAB — TSH: TSH: 0.707 u[IU]/mL (ref 0.450–4.500)

## 2019-05-10 LAB — COMPREHENSIVE METABOLIC PANEL
ALT: 15 IU/L (ref 0–44)
AST: 27 IU/L (ref 0–40)
Albumin/Globulin Ratio: 1.8 (ref 1.2–2.2)
Albumin: 4.2 g/dL (ref 3.8–4.8)
Alkaline Phosphatase: 116 IU/L (ref 39–117)
BUN/Creatinine Ratio: 6 — ABNORMAL LOW (ref 10–24)
BUN: 7 mg/dL — ABNORMAL LOW (ref 8–27)
Bilirubin Total: 0.4 mg/dL (ref 0.0–1.2)
CO2: 22 mmol/L (ref 20–29)
Calcium: 9.4 mg/dL (ref 8.6–10.2)
Chloride: 90 mmol/L — ABNORMAL LOW (ref 96–106)
Creatinine, Ser: 1.14 mg/dL (ref 0.76–1.27)
GFR calc Af Amer: 75 mL/min/{1.73_m2} (ref >59)
GFR calc non Af Amer: 65 mL/min/{1.73_m2} (ref >59)
Globulin, Total: 2.3 g/dL (ref 1.5–4.5)
Glucose: 99 mg/dL (ref 65–99)
Potassium: 4.2 mmol/L (ref 3.5–5.2)
Sodium: 128 mmol/L — ABNORMAL LOW (ref 134–144)
Total Protein: 6.5 g/dL (ref 6.0–8.5)

## 2019-05-10 LAB — CBC WITH DIFFERENTIAL/PLATELET
Basophils Absolute: 0 10*3/uL (ref 0.0–0.2)
Basos: 1 %
EOS (ABSOLUTE): 0.1 10*3/uL (ref 0.0–0.4)
Eos: 1 %
Hematocrit: 38.9 % (ref 37.5–51.0)
Hemoglobin: 13.9 g/dL (ref 13.0–17.7)
Immature Grans (Abs): 0 10*3/uL (ref 0.0–0.1)
Immature Granulocytes: 0 %
Lymphocytes Absolute: 2.1 10*3/uL (ref 0.7–3.1)
Lymphs: 36 %
MCH: 35.1 pg — ABNORMAL HIGH (ref 26.6–33.0)
MCHC: 35.7 g/dL (ref 31.5–35.7)
MCV: 98 fL — ABNORMAL HIGH (ref 79–97)
Monocytes Absolute: 0.5 10*3/uL (ref 0.1–0.9)
Monocytes: 8 %
Neutrophils Absolute: 3.2 10*3/uL (ref 1.4–7.0)
Neutrophils: 54 %
Platelets: 195 10*3/uL (ref 150–450)
RBC: 3.96 x10E6/uL — ABNORMAL LOW (ref 4.14–5.80)
RDW: 12.3 % (ref 11.6–15.4)
WBC: 5.9 10*3/uL (ref 3.4–10.8)

## 2019-05-10 LAB — LIPID PANEL
Chol/HDL Ratio: 1.5 ratio (ref 0.0–5.0)
Cholesterol, Total: 141 mg/dL (ref 100–199)
HDL: 97 mg/dL (ref >39)
LDL Calculated: 28 mg/dL (ref 0–99)
Triglycerides: 79 mg/dL (ref 0–149)
VLDL Cholesterol Cal: 16 mg/dL (ref 5–40)

## 2019-05-10 LAB — VITAMIN D 25 HYDROXY (VIT D DEFICIENCY, FRACTURES): Vit D, 25-Hydroxy: 34.7 ng/mL (ref 30.0–100.0)

## 2019-05-10 LAB — HEMOGLOBIN A1C
Est. average glucose Bld gHb Est-mCnc: 105 mg/dL
Hgb A1c MFr Bld: 5.3 % (ref 4.8–5.6)

## 2019-05-10 LAB — T4, FREE: Free T4: 0.98 ng/dL (ref 0.82–1.77)

## 2019-05-11 ENCOUNTER — Encounter: Payer: Self-pay | Admitting: Family Medicine

## 2019-05-11 ENCOUNTER — Ambulatory Visit (INDEPENDENT_AMBULATORY_CARE_PROVIDER_SITE_OTHER): Payer: Medicare Other | Admitting: Family Medicine

## 2019-05-11 ENCOUNTER — Other Ambulatory Visit: Payer: Self-pay

## 2019-05-11 VITALS — BP 120/88 | Temp 97.6°F | Ht 69.0 in | Wt 185.0 lb

## 2019-05-11 DIAGNOSIS — E559 Vitamin D deficiency, unspecified: Secondary | ICD-10-CM | POA: Diagnosis not present

## 2019-05-11 DIAGNOSIS — E871 Hypo-osmolality and hyponatremia: Secondary | ICD-10-CM | POA: Diagnosis not present

## 2019-05-11 DIAGNOSIS — Z79899 Other long term (current) drug therapy: Secondary | ICD-10-CM

## 2019-05-11 DIAGNOSIS — I10 Essential (primary) hypertension: Secondary | ICD-10-CM | POA: Diagnosis not present

## 2019-05-11 DIAGNOSIS — E785 Hyperlipidemia, unspecified: Secondary | ICD-10-CM

## 2019-05-11 MED ORDER — VITAMIN D 50 MCG (2000 UT) PO TABS: 2000.0000 [IU] | ORAL_TABLET | Freq: Every day | ORAL | Status: DC

## 2019-05-11 NOTE — Progress Notes (Signed)
This patient second office visit with me to review baseline routine labs that were recently obtained  Telehealth office visit note for Cody Perez, D.O- at Primary Care at [facility]   I connected with current patient today and verified that I am speaking with the correct person using two identifiers.   . Location of the patient: Home . Location of the provider: Office Only the patient (+/- their family members at pt's discretion) and myself were participating in the encounter    - This visit type was conducted due to national recommendations for restrictions regarding the COVID-19 Pandemic (e.g. social distancing) in an effort to limit this patient's exposure and mitigate transmission in our community.  This format is felt to be most appropriate for this patient at this time.   - The patient did not have access to video technology or had technical difficulties with video requiring transitioning to audio format only. - No physical exam could be performed with this format, beyond that communicated to [place] by the patient/ family members as noted.   - Additionally my office staff/ schedulers discussed with the patient that there may be a monetary charge related to this service, depending on their medical insurance.   The patient expressed understanding, and agreed to proceed.       History of Present Illness:  Last time patient established with me on 11/03/2018.  This is his second office visit with me.  He was told to bring blood pressure readings since his blood pressure was elevated last office visit mildly at 146/88  - HTN:   BP at home- been running 120/88.  Checks once every two wks.  Pretty stable - around what it is today.  No sx.    CHol: taking meds, no concerns  Low sodium:   Patient was told this is due to 1 of his prescription medicines from psychiatry.  They are monitoring it and actually have him on the medicine for the low sodium which patient cannot recall what it is   - has been told it was low for 6 months now- told by Psychiatry.  Has another f/up with them in very near future.  Drinks a glass of wine every other day and 4-5 c water daily  Insufficient vitamin D: Not take a separate vitamin D unit has never been told is low.   Recent Results (from the past 2160 hour(s))  CBC with Differential/Platelet     Status: Abnormal   Collection Time: 05/09/19  8:52 AM  Result Value Ref Range   WBC 5.9 3.4 - 10.8 x10E3/uL   RBC 3.96 (L) 4.14 - 5.80 x10E6/uL   Hemoglobin 13.9 13.0 - 17.7 g/dL   Hematocrit 16.1 09.6 - 51.0 %   MCV 98 (H) 79 - 97 fL   MCH 35.1 (H) 26.6 - 33.0 pg   MCHC 35.7 31.5 - 35.7 g/dL   RDW 04.5 40.9 - 81.1 %   Platelets 195 150 - 450 x10E3/uL   Neutrophils 54 Not Estab. %   Lymphs 36 Not Estab. %   Monocytes 8 Not Estab. %   Eos 1 Not Estab. %   Basos 1 Not Estab. %   Neutrophils Absolute 3.2 1.4 - 7.0 x10E3/uL   Lymphocytes Absolute 2.1 0.7 - 3.1 x10E3/uL   Monocytes Absolute 0.5 0.1 - 0.9 x10E3/uL   EOS (ABSOLUTE) 0.1 0.0 - 0.4 x10E3/uL   Basophils Absolute 0.0 0.0 - 0.2 x10E3/uL   Immature Granulocytes 0 Not Estab. %  Immature Grans (Abs) 0.0 0.0 - 0.1 x10E3/uL  Comprehensive metabolic panel     Status: Abnormal   Collection Time: 05/09/19  8:52 AM  Result Value Ref Range   Glucose 99 65 - 99 mg/dL   BUN 7 (L) 8 - 27 mg/dL   Creatinine, Ser 4.74 0.76 - 1.27 mg/dL   GFR calc non Af Amer 65 >59 mL/min/1.73   GFR calc Af Amer 75 >59 mL/min/1.73   BUN/Creatinine Ratio 6 (L) 10 - 24   Sodium 128 (L) 134 - 144 mmol/L   Potassium 4.2 3.5 - 5.2 mmol/L   Chloride 90 (L) 96 - 106 mmol/L   CO2 22 20 - 29 mmol/L   Calcium 9.4 8.6 - 10.2 mg/dL   Total Protein 6.5 6.0 - 8.5 g/dL   Albumin 4.2 3.8 - 4.8 g/dL   Globulin, Total 2.3 1.5 - 4.5 g/dL   Albumin/Globulin Ratio 1.8 1.2 - 2.2   Bilirubin Total 0.4 0.0 - 1.2 mg/dL   Alkaline Phosphatase 116 39 - 117 IU/L   AST 27 0 - 40 IU/L   ALT 15 0 - 44 IU/L  Hemoglobin A1c      Status: None   Collection Time: 05/09/19  8:52 AM  Result Value Ref Range   Hgb A1c MFr Bld 5.3 4.8 - 5.6 %    Comment:          Prediabetes: 5.7 - 6.4          Diabetes: >6.4          Glycemic control for adults with diabetes: <7.0    Est. average glucose Bld gHb Est-mCnc 105 mg/dL  Lipid panel     Status: None   Collection Time: 05/09/19  8:52 AM  Result Value Ref Range   Cholesterol, Total 141 100 - 199 mg/dL   Triglycerides 79 0 - 149 mg/dL   HDL 97 >25 mg/dL   VLDL Cholesterol Cal 16 5 - 40 mg/dL   LDL Calculated 28 0 - 99 mg/dL   Chol/HDL Ratio 1.5 0.0 - 5.0 ratio    Comment:                                   T. Chol/HDL Ratio                                             Men  Women                               1/2 Avg.Risk  3.4    3.3                                   Avg.Risk  5.0    4.4                                2X Avg.Risk  9.6    7.1                                3X Avg.Risk 23.4   11.0  VITAMIN D 25 Hydroxy (Vit-D Deficiency, Fractures)     Status: None   Collection Time: 05/09/19  8:52 AM  Result Value Ref Range   Vit D, 25-Hydroxy 34.7 30.0 - 100.0 ng/mL    Comment: Vitamin D deficiency has been defined by the Institute of Medicine and an Endocrine Society practice guideline as a level of serum 25-OH vitamin D less than 20 ng/mL (1,2). The Endocrine Society went on to further define vitamin D insufficiency as a level between 21 and 29 ng/mL (2). 1. IOM (Institute of Medicine). 2010. Dietary reference    intakes for calcium and D. [place]: The    [employer]. 2. Holick MF, Binkley Greencastle, Bischoff-Ferrari HA, et al.    Evaluation, treatment, and prevention of vitamin D    deficiency: an Endocrine Society clinical practice    guideline. JCEM. 2011 Jul; 96(7):1911-30.   TSH     Status: None   Collection Time: 05/09/19  8:52 AM  Result Value Ref Range   TSH 0.707 0.450 - 4.500 uIU/mL  T4, free     Status: None   Collection Time: 05/09/19   8:52 AM  Result Value Ref Range   Free T4 0.98 0.82 - 1.77 ng/dL       Impression and Recommendations:    1. Essential hypertension   2. Hyperlipidemia, unspecified hyperlipidemia type   3. Vitamin D insufficiency   4. Low sodium levels   5. High risk medications (not anticoagulants) long-term use- antipsychotics-  treated by psychiatry     -Blood pressure well controlled at home.  Continue current medications and continue monitoring. -Cholesterol is excellent.  LDL at goal and HDL is over 90.  Continue current statin medication.  LFTs within normal limits. -For patient's vitamin D-she will start 2000 IUs daily over-the-counter.  We will continue to monitor. -For patient's low sodium levels: Unfortunately I am unable to send patient psychiatry team a note on this.  I do not have any of their notes for my review.  Patient is following with them soon.  Patient was told to send me all of their notes in the near future.  Also, patient was very clear that this is being managed by them and is due to a psychiatric med side effect.  He also is clear that they have him on a medicine to help his sodium but he is not sure which 1.    Red flag symptoms of increased slight confusion etc. due to low sodium was reviewed with patient.  He will call 911 or his psychiatrist immediately if any of these develop.  Told patient's very important to keep his upcoming appointment with them in the very near future. -Patient is aware that these high risk medications that he is on through psychiatry need to be monitored regularly by them and refilled by them.   -Patient will need a Medicare wellness in the very near future as well more extensive memory questionnaires than what is on the Medicare wellness exam routinely   - As part of my medical decision making, I reviewed the following data within the electronic MEDICAL RECORD NUMBER History obtained from pt /family, CMA notes reviewed and incorporated if applicable, Labs  reviewed, Radiograph/ tests reviewed if applicable and OV notes from prior OV's with me, as well as other specialists she/he has seen since seeing me last, were all reviewed and used in my medical decision making process today.   - Additionally, discussion had with patient regarding txmnt  plan, and their biases/concerns about that plan were used in my medical decision making today.   - The patient agreed with the plan and demonstrated an understanding of the instructions.   No barriers to understanding were identified.   - Red flag symptoms and signs discussed in detail.  Patient expressed understanding regarding what to do in case of emergency\ urgent symptoms.  The patient was advised to call back or seek an in-person evaluation if the symptoms worsen or if the condition fails to improve as anticipated.   Return for Medicare wellness visit NEAR FUTURE; otherwise 50mo f/up BP, CHol, memory.   I provided 18 minutes of non-face-to-face time during this encounter,with over 50% of the time in direct counseling on patients medical conditions/ medical concerns.  Additional time was spent  with charting and coordination of care after the actual visit commenced.   Note:  This note was prepared with assistance of Dragon voice recognition software. Occasional wrong-word or sound-a-like substitutions may have occurred due to the inherent limitations of voice recognition software.  Cody Lot, DO     Patient Care Team    Relationship Specialty Notifications Start End  Cody Lot, DO PCP - General Family Medicine  11/29/18   Nyoka Cowden, MD Consulting Physician Pulmonary Disease  05/11/19   Lauraine Rinne., MD Attending Physician Psychiatry  05/11/19 05/11/19   Comment: sees Jan Fireman, NP  Deatra Robinson, NP  Nurse Practitioner  05/11/19    Comment: Works with Dr. Nolen Mu of psychiatry- [place]        -Vitals obtained; medications/ allergies reconciled;  personal medical, social, Sx  etc.histories were updated by CMA, reviewed by me and are reflected in chart   Patient Active Problem List   Diagnosis Date Noted  . Essential hypertension 04/15/2016    Priority: High  . Hyperlipidemia 03/03/2008    Priority: High  . COPD GOLD II active smoker 03/03/2008    Priority: High  . DEMENTIA 03/03/2008    Priority: Medium  . Vitamin D insufficiency 05/11/2019  . Upper airway cough syndrome 11/08/2018  . Current smoker-   1/2 ppd for 4 yrs or so 11/03/2018  . Onychomycosis of toenail 11/30/2013  . MITRAL REGURGITATION- with heart murmur 05/18/2008  . 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 ANXIETY 03/03/2008  . DEPRESSION 03/03/2008  . PERIPHERAL NEUROPATHY 03/03/2008     Current Meds  Medication Sig  . ABILIFY 10 MG tablet Take 5 mg by mouth daily.   Marland Kitchen aspirin 325 MG tablet Take 325 mg by mouth daily.  . B Complex-C-Folic Acid (STRESS B COMPLEX PO) Take 1 tablet by mouth daily.  . clonazePAM (KLONOPIN) 1 MG tablet Take 1 mg by mouth 2 (two) times daily.   Marland Kitchen donepezil (ARICEPT) 10 MG tablet 1 daily (Patient taking differently: Take 10 mg by mouth at bedtime. 1 daily)  . irbesartan (AVAPRO) 150 MG tablet TAKE 1 TABLET BY MOUTH EVERY DAY  . lamoTRIgine (LAMICTAL) 200 MG tablet Take 200 mg by mouth daily.   . Multiple Vitamin (MULTIVITAMIN) capsule Take 1 capsule by mouth daily.  . QUEtiapine (SEROQUEL) 300 MG tablet Take 300 mg by mouth at bedtime.   . risperiDONE (RISPERDAL) 3 MG tablet Take 3 mg by mouth daily.   . simvastatin (ZOCOR) 10 MG tablet TAKE 1 TABLET BY MOUTH EVERYDAY AT BEDTIME     Allergies:  No Known Allergies   ROS:  See above HPI for pertinent positives  and negatives   Objective:   Blood pressure 120/88, temperature 97.6 F (36.4 C), height  (1.753 m), weight 185 lb (83.9 kg).  (if some vitals are omitted, this means that patient was UNABLE to obtain them even though they were asked to get them prior to OV today.   They were asked to call us at their earliest convenience with these once obtained. )  General: A & O * 3; sounds in no acute distress; in usual state of health.  Skin: Pt confirms warm and dry extremities and pink fingertips HEENT: Pt confirms lips non-cyanotic Chest: Patient confirms normal chest excursion and movement Respiratory: speaking in full sentences, no conversational dyspnea; patient confirms no use of accessory muscles Psych: insight appears good, mood- appears full

## 2019-05-12 ENCOUNTER — Telehealth: Payer: Self-pay | Admitting: Internal Medicine

## 2019-05-12 MED ORDER — IRBESARTAN 150 MG PO TABS: 150.0000 mg | ORAL_TABLET | Freq: Every day | ORAL | 0 refills | Status: DC

## 2019-05-12 NOTE — Telephone Encounter (Signed)
Spoke with patient. He stated that he needed a refill on his irbesartan 150mg . Advised patient that I would send in a refill for him, he verbalized understanding.   Nothing further needed at time of call.

## 2019-05-30 DIAGNOSIS — F3176 Bipolar disorder, in full remission, most recent episode depressed: Secondary | ICD-10-CM | POA: Diagnosis not present

## 2019-06-23 ENCOUNTER — Other Ambulatory Visit: Payer: Self-pay | Admitting: *Deleted

## 2019-06-23 DIAGNOSIS — J449 Chronic obstructive pulmonary disease, unspecified: Secondary | ICD-10-CM

## 2019-06-26 ENCOUNTER — Ambulatory Visit: Payer: Medicare Other | Admitting: Internal Medicine

## 2019-07-26 ENCOUNTER — Other Ambulatory Visit: Payer: Self-pay | Admitting: Internal Medicine

## 2019-10-16 DIAGNOSIS — Z23 Encounter for immunization: Secondary | ICD-10-CM | POA: Diagnosis not present

## 2019-10-23 ENCOUNTER — Other Ambulatory Visit: Payer: Self-pay | Admitting: Internal Medicine

## 2019-10-30 ENCOUNTER — Other Ambulatory Visit: Payer: Self-pay | Admitting: Family Medicine

## 2019-10-30 ENCOUNTER — Telehealth: Payer: Self-pay | Admitting: Internal Medicine

## 2019-10-30 NOTE — Telephone Encounter (Signed)
Patient's wife called for refill on :  ( Please refill for 90dys supply)   irbesartan (AVAPRO) 150 MG tablet [[id number]]   Order Details Dose, Route, Frequency: As Directed  Note to Pharmacy:  Defer to PCP next time      Dispense Quantity: 90 tablet Refills: 0 Fills remaining: --        Sig: TAKE 1 TABLET BY MOUTH EVERY DAY     Forwarding request to med asst that if approved send refill order to :   [id number] [address], [place] [phone] (Phone) [phone] (Fax)   --glh --glh

## 2019-10-30 NOTE — Telephone Encounter (Signed)
Called and spoke with pt's wife Cody Perez asking which med she was asking about for pt that was denied. Cody Perez said that the irbesartan was denied a refill by [place]. When looking at pt's med list, there was a message from 8/12 that said med to be deferred to pt's pcp for further refills. I stated that to [facility] and she verbalized understanding and said that they would call PCP in the morning.nothing further needed.

## 2019-10-31 MED ORDER — IRBESARTAN 150 MG PO TABS: 150.0000 mg | ORAL_TABLET | Freq: Every day | ORAL | 0 refills | Status: DC

## 2019-10-31 NOTE — Telephone Encounter (Signed)
We have not prescribed these medications for the patient previously.  Dr Melvyn Novas has been writing RX, however he noted at last refill and further refills were to be deferred to PCP. Please review and refill if appropriate.  Charyl Bigger, CMA

## 2019-11-21 ENCOUNTER — Telehealth: Payer: Self-pay | Admitting: Internal Medicine

## 2019-11-21 NOTE — Telephone Encounter (Signed)
Called and spoke with pt's wife Mariann Laster in regards to PFT appt. Per Mariann Laster, pft was originally scheduled for July 2020 but due to pt never being covid tested, this was not done.  Stated to her that we were booked until Feb 2021 and she verbalized understanding. PFT scheduled for pt 01/17/2020 at 3pm. Routing to [facility] so they are made aware and that way pt could be scheduled for covid test when time.

## 2019-11-22 NOTE — Telephone Encounter (Signed)
Called pt's wife and left vm for her to call me back to schedule covid test.

## 2019-11-23 NOTE — Telephone Encounter (Signed)
Spoke to pt's wife and scheduled covid test.  Nothing further needed.

## 2019-11-28 ENCOUNTER — Encounter: Payer: Self-pay | Admitting: Family Medicine

## 2019-11-28 ENCOUNTER — Ambulatory Visit (INDEPENDENT_AMBULATORY_CARE_PROVIDER_SITE_OTHER): Payer: Medicare Other | Admitting: Family Medicine

## 2019-11-28 ENCOUNTER — Other Ambulatory Visit: Payer: Self-pay

## 2019-11-28 VITALS — BP 156/81 | HR 61 | Wt 190.0 lb

## 2019-11-28 DIAGNOSIS — I10 Essential (primary) hypertension: Secondary | ICD-10-CM | POA: Diagnosis not present

## 2019-11-28 DIAGNOSIS — S76111S Strain of right quadriceps muscle, fascia and tendon, sequela: Secondary | ICD-10-CM | POA: Insufficient documentation

## 2019-11-28 DIAGNOSIS — R2681 Unsteadiness on feet: Secondary | ICD-10-CM | POA: Diagnosis not present

## 2019-11-28 DIAGNOSIS — R29898 Other symptoms and signs involving the musculoskeletal system: Secondary | ICD-10-CM

## 2019-11-28 NOTE — Progress Notes (Signed)
Virtual / live video office visit note for [employer], D.O- Primary Care Physician at [facility]   I connected with current patient today and beyond visually recognizing the correct individual, I verified that I am speaking with the correct person using two identifiers.  . Location of the patient: Home . Location of the provider: Office Only the patient (+/- their family members at pt's discretion) and myself were participating in the encounter    - This visit type was conducted due to national recommendations for restrictions regarding the COVID-19 Pandemic (e.g. social distancing) in an effort to limit this patient's exposure and mitigate transmission in our community.  This format is felt to be most appropriate for this patient at this time.   - The patient did have access to video technology today   - No physical exam could be performed with this format, beyond that communicated to [place] by the patient/ family members as noted.   - Additionally my office staff/ schedulers discussed with the patient that there may be a monetary charge related to this service, depending on patient's medical insurance.   The patient expressed understanding, and agreed to proceed.      History of Present Illness:  I, Toni Amend, am serving as scribe for Dr. Mellody Dance.   Vitals today reported by patient: BP = 156/81 Heart Rate = 61 Weight = 190 lbs  HTN: - pt hasn't been checking bp at home more recently.  Pt pt has been a few wks since checking.  Normally Bp runs "in the normal range".  Per pt under 140/90. - He used to check twice monthly or so. -  Asx, no complaints.    - Pt's primary concern today is his Right Leg Weakness He's having trouble walking down steps and downhill, but has no trouble walking uphill.  Notes "bumps in the ground seem to upset his equilibrium."  Says it's been a problem ever since he had a quadriceps operation about five years ago.  Notes "not before  the operation."  When he initially injured his quadriceps, notes he was stepping down the steps to check his tomatoes early in the morning, slipped, and "heard his muscle snap."  Notes he knew something was wrong immediately.  He went to the doctor and was told he needed an operation ASAP.  Notes in the office that day, his leg was bent at the knee while he was sitting, and he couldn't lift his leg at all.  Had the operation through Dr. Sharol Given.  Denies leg pain currently; states "just weakness, that's all."  Nothing has changed drastically.  Says the weakness has "pretty much stayed the same" over the years.  States "if it has weakened incrementally, I haven't noticed it."  Patient was told to do PT, "which I did, as long as the insurance held out."  States he thinks he did physical therapy after the surgery for a month.  Notes "I haven't been getting enough exercise, I'm pretty sedentary."   - Psychiatric Care Followed up with Pauline Good, NP, in the recent past.    Depression screen [facility] 2/9 11/28/2019 05/11/2019 11/03/2018 09/20/2018 05/05/2017  Decreased Interest 0 0 0 0 0  Down, Depressed, Hopeless 0 0 0 0 0  PHQ - 2 Score 0 0 0 0 0  Altered sleeping 1 0 0 - -  Tired, decreased energy 0 0 0 - -  Change in appetite 0 0 0 - -  Feeling bad or failure about yourself  0 0 0 - -  Trouble concentrating 0 0 0 - -  Moving slowly or fidgety/restless 0 0 0 - -  Suicidal thoughts 0 0 0 - -  PHQ-9 Score 1 0 0 - -  Difficult doing work/chores Not difficult at all Not difficult at all Not difficult at all - -    GAD 7 : Generalized Anxiety Score 05/11/2019  Nervous, Anxious, on Edge 0  Control/stop worrying 0  Worry too much - different things 0  Trouble relaxing 0  Restless 0  Easily annoyed or irritable 0  Afraid - awful might happen 0  Total GAD 7 Score 0  Anxiety Difficulty Not difficult at all     Impression and Recommendations:    1. 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 Weakness of right leg   3. Unsteady gait when walking- due to Quad muscle rupture and repair 2015   4. HTN, goal below 150/90; poorly controlled     Weakness of R Leg; Quadriceps Muscle Rupture, R, Sequela - s/p repair by ortho 2015 - Conversation held regarding patient's symptoms today. - Education provided and all questions answered.  - Discussed need for patient to return to Dr. Lajoyce Corners of Orthopedics for evaluation. - Per patient, leg weakness has been chronic since quadriceps surgery five years ago. - Ambulatory referral to Orthopedic Surgery placed today.  See orders.  - Reviewed importance of specialist evaluation for further intervention and next steps. - Encouraged patient to ask specialist about the option of resuming physical therapy to improve strength and flexibility.  If this is not an option, patient knows to ask what else he can do to manage the weakness in his leg/difficulty walking across uneven ground.  - Advised patient to ask specialist about options to improve quality of life, such as a leg brace.  - To improve stability, balance, and strength, encouraged increased exercise. - Advised patient to walk on even, forgiving ground ONLY.  - Discussed prudent engagement in home exercises, such as quadriceps extensions, leg curls, straight leg raises, and other muscle strengthening techniques.  - If patient is feeling unstable, recommended using a cane or other assistive device until he feels his strength has improved.  - Will continue to monitor.   Psychiatric Care - Need for Records - Patient agrees to find name of practice and call front desk with information.   Elevated BP Reading - Hypertension, poorly controlled - BP elevated during appointment today.  Need for f/up d/c pt. - Told patient to check his BP once per day, and write down BP and pulse in log. - If blood pressure continues running high, treatment plan will be adjusted next  OV.  - Reviewed goal BP with patient today. - Patient knows to call in with any acute concerns.  - Will continue to monitor.   Recommendations - Last OV was in May. - Discussed critical need for chronic follow-up near future- pt has been lost to f/up for chronic care.  - As part of my medical decision making, I reviewed the following data within the electronic MEDICAL RECORD NUMBER History obtained from pt /family, CMA notes reviewed and incorporated if applicable, Labs reviewed, Radiograph/ tests reviewed if applicable and OV notes from prior OV's with me, as well as other specialists she/he has seen since seeing me last, were all reviewed and used in my medical decision making process today.   -  Additionally, discussion had with patient regarding txmnt plan, their biases about that plan etc were used in my medical decision making today.   - The patient agreed with the plan and demonstrated an understanding of the instructions.   No barriers to understanding were identified.     Return for HTN, HLD, memory concerns near future.   Orders Placed This Encounter  Procedures  . Ambulatory referral to Orthopedic Surgery    Medications Discontinued During This Encounter  Medication Reason  . Cholecalciferol (VITAMIN D) 50 MCG (2000 UT) tablet Error     Note:  This note was prepared with assistance of Dragon voice recognition software. Occasional wrong-word or sound-a-like substitutions may have occurred due to the inherent limitations of voice recognition software.   This document serves as a record of services personally performed by Thomasene Lot, DO. It was created on her behalf by Peggye Fothergill, a trained medical scribe. The creation of this record is based on the scribe's personal observations and the provider's statements to them.   This case required medical decision making of at least moderate complexity. The above documentation has been reviewed to be accurate and was  completed by Carlye Grippe, D.O.      Patient Care Team    Relationship Specialty Notifications Start End  Thomasene Lot, DO PCP - General Family Medicine  11/29/18   Nyoka Cowden, MD Consulting Physician Pulmonary Disease  05/11/19   Deatra Robinson, NP  Nurse Practitioner  05/11/19    Comment: Works with Dr. Nolen Mu of psychiatry- Stark Klein, NP Physician Assistant Nurse Practitioner  11/28/19    Comment: [facility]- not sure of supervising physician  Nadara Mustard, MD Consulting Physician Orthopedic Surgery  11/28/19     -Vitals obtained; medications/ allergies reconciled;  personal medical, social, Sx etc.histories were updated by CMA, reviewed by me and are reflected in chart  Patient Active Problem List   Diagnosis Date Noted  . Current smoker-   1/2 ppd for 4 yrs or so 11/03/2018  . HTN, goal below 140/90 04/15/2016  . Hyperlipidemia 03/03/2008  . ANXIETY 03/03/2008  . 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 COPD GOLD II active smoker 03/03/2008  . Weakness of right leg 11/28/2019  . Vitamin D insufficiency 05/11/2019  . DEMENTIA 03/03/2008  . 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 Unsteady gait when walking- due to Quad muscle rupture and repair 2015 11/28/2019  . PERIPHERAL NEUROPATHY 03/03/2008  . Upper airway cough syndrome 11/08/2018  . Onychomycosis of toenail 11/30/2013  . MITRAL REGURGITATION- with heart murmur 05/18/2008  . 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 ABILIFY 10 MG tablet Take 5 mg by mouth daily.   Marland Kitchen aspirin 325 MG tablet Take 325 mg by mouth daily.  . B Complex-C-Folic Acid (STRESS B COMPLEX PO) Take 1 tablet by mouth daily.  . clonazePAM (KLONOPIN) 1 MG tablet Take 1 mg by mouth 2 (two) times daily.   Marland Kitchen donepezil (ARICEPT) 10 MG  tablet 1 daily (Patient taking differently: Take 10 mg by mouth at bedtime. 1 daily)  . irbesartan (AVAPRO) 150 MG tablet Take 1 tablet (150 mg total) by mouth daily.  Marland Kitchen lamoTRIgine (LAMICTAL) 200 MG tablet Take 200 mg by mouth daily.   . Multiple Vitamin (MULTIVITAMIN)  capsule Take 1 capsule by mouth daily.  . QUEtiapine (SEROQUEL) 300 MG tablet Take 300 mg by mouth at bedtime.   . risperiDONE (RISPERDAL) 3 MG tablet Take 3 mg by mouth daily.   . simvastatin (ZOCOR) 10 MG tablet TAKE 1 TABLET BY MOUTH EVERYDAY AT BEDTIME     No Known Allergies   ROS:  See above HPI for pertinent positives and negatives   Objective:   Blood pressure (!) 156/81, pulse 61, weight 190 lb (86.2 kg).  (if some vitals are omitted, this means that patient was UNABLE to obtain them even though they were asked to get them prior to OV today.  They were asked to call us at their earliest convenience with these once obtained.)  General: A & O * 3; visually in no acute distress; in usual state of health.  Skin: Visible skin appears normal and pt's usual skin color HEENT:  EOMI, head is normocephalic and atraumatic.  Sclera are anicteric. Neck has a good range of motion.  Lips are noncyanotic Chest: normal chest excursion and movement Respiratory: speaking in full sentences, no conversational dyspnea; no use of accessory muscles Psych: insight good, mood- appears full

## 2019-11-29 DIAGNOSIS — F3176 Bipolar disorder, in full remission, most recent episode depressed: Secondary | ICD-10-CM | POA: Diagnosis not present

## 2019-12-04 ENCOUNTER — Encounter: Payer: Self-pay | Admitting: Orthopedic Surgery

## 2019-12-04 ENCOUNTER — Ambulatory Visit (INDEPENDENT_AMBULATORY_CARE_PROVIDER_SITE_OTHER): Payer: Medicare Other | Admitting: Orthopedic Surgery

## 2019-12-04 ENCOUNTER — Other Ambulatory Visit: Payer: Self-pay

## 2019-12-04 VITALS — Ht 69.0 in | Wt 190.0 lb

## 2019-12-04 DIAGNOSIS — M6281 Muscle weakness (generalized): Secondary | ICD-10-CM

## 2019-12-04 NOTE — Progress Notes (Signed)
Office Visit Note   Patient: Cody Perez           Date of Birth: [birth year]           MRN: [id number] Visit Date: 12/04/2019              Requested by: Mellody Dance, DO [place],  [place] [address] PCP: Mellody Dance, DO  Chief Complaint  Patient presents with  . Right Thigh - Pain, Follow-up      HPI: Patient is a 68 year old gentleman who was seen for evaluation for weakness in the right quadricep muscles.  Patient is status post quad tendon reconstruction in 2015.  Patient denies any pain he states he has difficulty going downhill.  He states his symptoms have been going on for months.  He states he can walk uphill without problems.  Assessment & Plan: Visit Diagnoses:  1. Quadriceps weakness     Plan: We will set the patient up for quad strengthening and physical therapy.  There is no palpable defect no indication for surgical intervention.  Follow-Up Instructions: Return if symptoms worsen or fail to improve.   Ortho Exam  Patient is alert, oriented, no adenopathy, well-dressed, normal affect, normal respiratory effort. Examination patient does have an antalgic gait.  With him seated he lacks about 30 degrees to full extension with quadriceps contraction there is no palpable defect he does have significant quadriceps atrophy.  With passive extension he obtains full extension but cannot hold it fully extended secondary to his quad weakness.  Imaging: No results found. No images are attached to the encounter.  Labs: Lab Results  Component Value Date   HGBA1C 5.3 05/09/2019     Lab Results  Component Value Date   ALBUMIN 4.2 05/09/2019   ALBUMIN 4.1 09/20/2018   ALBUMIN 4.1 05/05/2017    No results found for: MG Lab Results  Component Value Date   VD25OH 34.7 05/09/2019    No results found for: PREALBUMIN CBC EXTENDED Latest Ref Rng & Units 05/09/2019 09/20/2018 05/05/2017  WBC 3.4 - 10.8 x10E3/uL 5.9 5.5 5.1  RBC 4.14 - 5.80  x10E6/uL 3.96(L) 3.92(L) 4.16(L)  HGB 13.0 - 17.7 g/dL 13.9 14.3 14.6  HCT 37.5 - 51.0 % 38.9 40.0 42.2  PLT 150 - 450 x10E3/uL 195 272.0 204.0  NEUTROABS 1.4 - 7.0 x10E3/uL 3.2 3.3 2.9  LYMPHSABS 0.7 - 3.1 x10E3/uL 2.1 1.8 1.7     Body mass index is 28.06 kg/m.  Orders:  No orders of the defined types were placed in this encounter.  No orders of the defined types were placed in this encounter.    Procedures: No procedures performed  Clinical Data: No additional findings.  ROS:  All other systems negative, except as noted in the HPI. Review of Systems  Objective: Vital Signs: Ht 5\' 9"  (1.753 m)   Wt 190 lb (86.2 kg)   BMI 28.06 kg/m   Specialty Comments:  No specialty comments available.  PMFS History: Patient Active Problem List   Diagnosis Date Noted  . 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 Weakness of right leg 11/28/2019  . Unsteady gait when walking- due to Quad muscle rupture and repair 2015 11/28/2019  . Vitamin D insufficiency 05/11/2019  . Upper airway cough syndrome 11/08/2018  . Current smoker-   1/2 ppd for 4 yrs or so 11/03/2018  . HTN, goal below 140/90 04/15/2016  . Onychomycosis of toenail  11/30/2013  . MITRAL REGURGITATION- with heart murmur 05/18/2008  . 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 Hyperlipidemia 03/03/2008  . DEMENTIA 03/03/2008  . ANXIETY 03/03/2008  . 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 PERIPHERAL NEUROPATHY 03/03/2008  . COPD GOLD II active smoker 03/03/2008   Past Medical History:  Diagnosis Date  . Hyperlipidemia   . Hypertension     Family History  Adopted: Yes    History reviewed. No pertinent surgical history. Social History   Occupational History  . Not on file  Tobacco Use  . Smoking status: Current Every Day Smoker    Packs/day: 0.50    Years: 10.00    Pack years:  5.00    Types: Cigarettes  . Smokeless tobacco: Never Used  Substance and Sexual Activity  . Alcohol use: Yes    Alcohol/week: 2.0 standard drinks    Types: 2 Standard drinks or equivalent per week  . Drug use: Never  . Sexual activity: Not Currently

## 2019-12-05 ENCOUNTER — Other Ambulatory Visit: Payer: Self-pay | Admitting: Family Medicine

## 2019-12-05 ENCOUNTER — Telehealth: Payer: Self-pay | Admitting: Family Medicine

## 2019-12-05 DIAGNOSIS — E7800 Pure hypercholesterolemia, unspecified: Secondary | ICD-10-CM

## 2019-12-05 MED ORDER — SIMVASTATIN 10 MG PO TABS: ORAL_TABLET | ORAL | 0 refills | Status: DC

## 2019-12-05 NOTE — Telephone Encounter (Signed)
RX sent to pharmacy today.  T. Ameshia Pewitt, CMA 

## 2019-12-05 NOTE — Telephone Encounter (Signed)
Patient called, stating that he is out of medication listed below and needs a refill on it, states that PCP actually has never refilled this for him, but states that the last one that refilled it Sherren Mocha, Jory Ee, MD) has retired. Please Advise- AM   simvastatin (ZOCOR) 10 MG tablet [[id number]]   [id number] [address], [place] [phone] (Phone) [phone] (Fax)

## 2019-12-18 ENCOUNTER — Other Ambulatory Visit: Payer: Medicare Other

## 2019-12-27 ENCOUNTER — Encounter: Payer: Self-pay | Admitting: Physical Therapy

## 2019-12-27 ENCOUNTER — Ambulatory Visit (INDEPENDENT_AMBULATORY_CARE_PROVIDER_SITE_OTHER): Payer: Medicare Other | Admitting: Physical Therapy

## 2019-12-27 ENCOUNTER — Other Ambulatory Visit: Payer: Self-pay

## 2019-12-27 DIAGNOSIS — M6281 Muscle weakness (generalized): Secondary | ICD-10-CM

## 2019-12-27 DIAGNOSIS — R2689 Other abnormalities of gait and mobility: Secondary | ICD-10-CM | POA: Diagnosis not present

## 2019-12-27 NOTE — Therapy (Signed)
[facility] Physical Therapy [address] [place], [place], [address] Phone: [phone]   Fax:  [phone]  Physical Therapy Evaluation  Patient Details  Name: Cody Perez MRN: [id number] Date of Birth: 12-10-1951 Referring Provider (PT): Nadara Mustard, MD   Encounter Date: 12/27/2019  PT End of Session - 12/27/19 1335    Visit Number  1    Number of Visits  12    Date for PT Re-Evaluation  02/07/20    Authorization Type  MCR supplement    PT Start Time  1150    PT Stop Time  1230    PT Time Calculation (min)  40 min    Activity Tolerance  Patient tolerated treatment well    Behavior During Therapy  [facility] for tasks assessed/performed       Past Medical History:  Diagnosis Date  . Hyperlipidemia   . Hypertension     History reviewed. No pertinent surgical history.  There were no vitals filed for this visit.   Subjective Assessment - 12/27/19 1320    Subjective  Patient is status post Rt quad tendon reconstruction in 2015. He is still having Rt quad weaness and is having difficulty walking downhill. He also relays difficulty with balance but has not had any falls. 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    Pertinent History  PMH: periheral neuropathy, HTN, status post Rt quad tendon reconstruction in 2015    Limitations  Lifting;Standing;Walking;House hold activities    How long can you stand comfortably?  30 min    How long can you walk comfortably?  20 min    Patient Stated Goals  improve leg strength and balance    Currently in Pain?  No/denies         [facility] PT Assessment - 12/27/19 0001      Assessment   Medical Diagnosis  Rt quad weakness, gait/balance    Referring Provider (PT)  Nadara Mustard, MD    Onset Date/Surgical Date  --   quad tendon reconstruction 2015   Next MD Visit  PRN    Prior Therapy  no recent PT      Precautions   Precautions  Fall      Restrictions   Weight Bearing Restrictions  No      Balance Screen   Has the patient fallen in the past 6 months  No      Home Environment   Living Environment  Private residence    Additional Comments  has stairs relays he must go one step at a time      Prior Function   Level of Independence  Independent      Cognition   Overall Cognitive Status  Within Functional Limits for tasks assessed      Observation/Other Assessments   Observations  atrophy in Rt quads      Sensation   Light Touch  Appears Intact      ROM / Strength   AROM / PROM / Strength  AROM;Strength      AROM   AROM Assessment Site  Knee    Right/Left Knee  Right    Right Knee Extension  -5   with leg on mat, for LAQ he lacks about 30 deg due to weak   Right Knee Flexion  --   WNL     Strength   Overall Strength Comments  All tested in sitting, hip flexion 5/5, hip abd 4+, Lt knee 5/5, Rt  knee flexion 5, Rt knee ext 3-, but can resist at his available ROM      Flexibility   Soft Tissue Assessment /Muscle Length  --   mild tightness in hamstrings     Palpation   Palpation comment  no TTP reported      Transfers   Transfers  Independent with all Transfers      Ambulation/Gait   Gait Comments  walks without AD but wide BOS with feet/hips externally rotated and walks on his heels with unsteadiness      Functional Gait  Assessment   Gait assessed   Yes    Gait Level Surface  Walks 20 ft in less than 5.5 sec, no assistive devices, good speed, no evidence for imbalance, normal gait pattern, deviates no more than 6 in outside of the 12 in walkway width.    Change in Gait Speed  Able to change speed, demonstrates mild gait deviations, deviates 6-10 in outside of the 12 in walkway width, or no gait deviations, unable to achieve a major change in velocity, or uses a change in velocity, or uses an assistive device.    Gait with Horizontal Head Turns  Performs head turns with moderate changes in gait velocity, slows down, deviates 10-15 in outside 12 in walkway width but recovers,  can continue to walk.    Gait with Vertical Head Turns  Performs task with slight change in gait velocity (eg, minor disruption to smooth gait path), deviates 6 - 10 in outside 12 in walkway width or uses assistive device    Gait and Pivot Turn  Turns slowly, requires verbal cueing, or requires several small steps to catch balance following turn and stop    Step Over Obstacle  Is able to step over one shoe box (4.5 in total height) without changing gait speed. No evidence of imbalance.    Gait with Narrow Base of Support  Ambulates less than 4 steps heel to toe or cannot perform without assistance.    Gait with Eyes Closed  Cannot walk 20 ft without assistance, severe gait deviations or imbalance, deviates greater than 15 in outside 12 in walkway width or will not attempt task.    Ambulating Backwards  Walks 20 ft, slow speed, abnormal gait pattern, evidence for imbalance, deviates 10-15 in outside 12 in walkway width.    Steps  Two feet to a stair, must use rail.    Total Score  13                Objective measurements completed on examination: See above findings.      [facility] Adult PT Treatment/Exercise - 12/27/19 0001      Exercises   Exercises  Knee/Hip      Knee/Hip Exercises: Stretches   Active Hamstring Stretch  Left;1 rep;30 seconds    Active Hamstring Stretch Limitations  seated      Knee/Hip Exercises: Standing   Forward Step Up  Left;10 reps;Hand Hold: 2;Step Height: 8"    Forward Step Up Limitations  knee hyperextension due to quad weakness    Functional Squat Limitations  mini squat at counter with UE suppport X 10 reps      Knee/Hip Exercises: Seated   Long Arc Quad  Left;10 reps    Long Arc Quad Limitations  unable to achieve full ext due to weakness    Sit to [employer]  10 reps;Other (comment)    UE's from his knees, placed Lt leg further for wt  shift Rt     Knee/Hip Exercises: Supine   Quad Sets  Left;10 reps    Short Arc Quad Sets Limitations  unable due to  weakness    Straight Leg Raises  Left;10 reps    Straight Leg Raises Limitations  extension lag             PT Education - 12/27/19 1334    Education Details  HEP, exam findings, POC    Person(s) Educated  Patient    Methods  Explanation;Demonstration;Verbal cues;Handout    Comprehension  Verbalized understanding;Returned demonstration;Need further instruction          PT Long Term Goals - 12/27/19 1341      PT LONG TERM GOAL #1   Title  Pt will be I and compliant with HEP. (Target for all goals 6 weeks 02/07/20)    Status  New      PT LONG TERM GOAL #2   Title  Pt will be able to perform long arc quad to full ROM to show improved overall quad strength needed for functional abilities.    Status  New      PT LONG TERM GOAL #3   Title  Pt will be able to perform stairs reciprocally with one handrail.    Status  New      PT LONG TERM GOAL #4   Title  Pt will improve FGA to at least 19 to show improved balance    Status  New      PT LONG TERM GOAL #5   Title  Pt will perform BERG balance test and then goal will be created.    Status  New             Plan - 12/27/19 1335    Clinical Impression Statement  Pt presents with Rt quad weakness, gait abnormality,decreased balance, and deconditioning. Patient is status post Rt quad tendon reconstruction in 2015.  His balance scores indicated he is at a risk for falling but he does not use AD. He will benefit from skilled PT to address his defictis. Due to his weakness and difficulty with gait/balance he may need neurological consult if he does not improve with PT.    Personal Factors and Comorbidities  Comorbidity 3+;Comorbidity 2    Comorbidities  PMH: periheral neuropathy, HTN, status post Rt quad tendon reconstruction in 2015    Examination-Activity Limitations  Bend;Carry;Squat;Stairs;Lift;Stand;Locomotion Level    Examination-Participation Restrictions  Cleaning;Community Activity;Shop;Yard Work    Midwife  Evolving/Moderate complexity    Clinical Decision Making  Moderate    Rehab Potential  Fair    PT Frequency  2x / week    PT Duration  6 weeks    PT Treatment/Interventions  ADLs/Self Care Home Management;Aquatic Therapy;Cryotherapy;Electrical Stimulation;Ultrasound;Gait training;Stair training;Functional mobility training;Therapeutic activities;Therapeutic exercise;Balance training;Neuromuscular re-education;Manual techniques;Passive range of motion;Dry needling;Taping    PT Next Visit Plan  test reflexes, test BERG for baseline, needs quad strength, gait,balance    PT Home Exercise Plan  Access Code: [email or web address]    Consulted and Agree with Plan of Care  Patient       Patient will benefit from skilled therapeutic intervention in order to improve the following deficits and impairments:  Abnormal gait, Decreased activity tolerance, Decreased endurance, Decreased balance, Decreased strength, Decreased range of motion, Difficulty walking  Visit Diagnosis: Muscle weakness (generalized)  Other abnormalities of gait and mobility     Problem List Patient Active Problem List   Diagnosis Date  Noted  . 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 Weakness of right leg 11/28/2019  . Unsteady gait when walking- due to Quad muscle rupture and repair 2015 11/28/2019  . Vitamin D insufficiency 05/11/2019  . Upper airway cough syndrome 11/08/2018  . Current smoker-   1/2 ppd for 4 yrs or so 11/03/2018  . HTN, goal below 140/90 04/15/2016  . Onychomycosis of toenail 11/30/2013  . MITRAL REGURGITATION- with heart murmur 05/18/2008  . 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 Hyperlipidemia 03/03/2008  . DEMENTIA 03/03/2008  . ANXIETY 03/03/2008  . Major depressive disorder, single episode with psychotic features ([place])- txed by psychiatry 03/03/2008  . PERIPHERAL NEUROPATHY 03/03/2008  . COPD GOLD II active smoker  03/03/2008    Silvestre Mesi 12/27/2019, 1:44 PM  [facility] Physical Therapy [address] [place], [place], [address] Phone: [phone]   Fax:  [phone]  Name: Cody Perez MRN: [id number] Date of Birth: 1951/11/07

## 2019-12-27 NOTE — Patient Instructions (Signed)
Access Code: [email or web address]  URL: [email or web address]  Date: 12/27/2019  Prepared by: Ivery Quale   Exercises  Seated Hamstring Stretch - 2 reps - 1 sets - 30 hold - 2x daily - 6x weekly  Supine Quad Set - 10 reps - 2 sets - 5 hold - 2x daily - 6x weekly  Active Straight Leg Raise with Quad Set - 10 reps - 1-2 sets - 2x daily - 6x weekly  Mini Squat with Counter Support - 10 reps - 1-2 sets - 2x daily - 6x weekly  Sit to Stand without Arm Support - 10 reps - 1-2 sets - 2x daily - 6x weekly  Forward Step Up with Counter Support - 10 reps - 1-2 sets - 2x daily - 6x weekly  Tandem Stance - 3 sets - 30 hold - 2x daily - 6x weekly  Walking with Head Rotation - 3 reps - 2x daily - 6x weekly  Standing Balance in Corner with Eyes Closed - 2-3 reps - 30 sec hold - 2x daily - 6x weekly

## 2020-01-09 ENCOUNTER — Ambulatory Visit (INDEPENDENT_AMBULATORY_CARE_PROVIDER_SITE_OTHER): Payer: Medicare Other

## 2020-01-09 ENCOUNTER — Encounter: Payer: Self-pay | Admitting: Family

## 2020-01-09 ENCOUNTER — Ambulatory Visit (INDEPENDENT_AMBULATORY_CARE_PROVIDER_SITE_OTHER): Payer: Medicare Other | Admitting: Orthopedic Surgery

## 2020-01-09 ENCOUNTER — Ambulatory Visit (INDEPENDENT_AMBULATORY_CARE_PROVIDER_SITE_OTHER): Payer: Medicare Other | Admitting: Physical Therapy

## 2020-01-09 ENCOUNTER — Other Ambulatory Visit: Payer: Self-pay

## 2020-01-09 VITALS — Ht 69.0 in | Wt 190.0 lb

## 2020-01-09 DIAGNOSIS — M542 Cervicalgia: Secondary | ICD-10-CM

## 2020-01-09 DIAGNOSIS — M545 Low back pain, unspecified: Secondary | ICD-10-CM

## 2020-01-09 DIAGNOSIS — R2689 Other abnormalities of gait and mobility: Secondary | ICD-10-CM

## 2020-01-09 DIAGNOSIS — M6281 Muscle weakness (generalized): Secondary | ICD-10-CM

## 2020-01-09 NOTE — Therapy (Signed)
[facility] Physical Therapy [address] [place], [place], [address] Phone: [phone]   Fax:  [phone]  Physical Therapy Treatment  Patient Details  Name: Cody Perez MRN: [id number] Date of Birth: [birth year] Referring Provider (PT): Nadara Mustard, MD   Encounter Date: 01/09/2020  PT End of Session - 01/09/20 1410    Visit Number  2    Number of Visits  12    Date for PT Re-Evaluation  02/07/20    Authorization Type  MCR supplement    PT Start Time  1315    PT Stop Time  1355    PT Time Calculation (min)  40 min    Activity Tolerance  Patient tolerated treatment well    Behavior During Therapy  [facility] for tasks assessed/performed       Past Medical History:  Diagnosis Date  . Hyperlipidemia   . Hypertension     No past surgical history on file.  There were no vitals filed for this visit.  Subjective Assessment - 01/09/20 1323    Subjective  He relays his legs feel a little better, he relays compliance with HEP. He denies pain today    Pertinent History  PMH: periheral neuropathy, HTN, status post Rt quad tendon reconstruction in 2015    Limitations  Lifting;Standing;Walking;House hold activities    How long can you stand comfortably?  30 min    How long can you walk comfortably?  20 min    Patient Stated Goals  improve leg strength and balance         [facility] PT Assessment - 01/09/20 0001      Assessment   Medical Diagnosis  Rt quad weakness, gait/balance    Referring Provider (PT)  Nadara Mustard, MD      Deep Tendon Reflexes   DTR Assessment Site  Triceps;Patella    Triceps DTR  --   unable to get on Rt, hyperreflexive on Lt   Patella DTR  --   hyperreflexive bilat     Special Tests   Other special tests  negative hoffmans sign for UMN, + babinski sign on Rt foot for UMN                   [facility] Adult PT Treatment/Exercise - 01/09/20 0001      Knee/Hip Exercises: Stretches   Active Hamstring Stretch  Both;2 reps;30  seconds    Active Hamstring Stretch Limitations  supine with strap    Quad Stretch  Right;2 reps;30 seconds      Knee/Hip Exercises: Aerobic   Recumbent Bike  5 min L3      Knee/Hip Exercises: Seated   Long Arc Quad  Both;15 reps    Long Arc Quad Limitations  unable to achieve full ext due to weakness on Rt side      Knee/Hip Exercises: Supine   Quad Sets  Both;15 reps    Short Arc Quad Sets Limitations  X 10 reps both side but unable to perform without coming up into [employer]  15 reps    Straight Leg Raises  Both;15 reps    Straight Leg Raises Limitations  extension lag on Rt    Other Supine Knee/Hip Exercises  clams with L3 band X 15 reps             PT Education - 01/09/20 1410    Education Details  exam findings for possible UMN involvment, recommendation for imaging  Person(s) Educated  Patient    Methods  Explanation    Comprehension  Verbalized understanding          PT Long Term Goals - 12/27/19 1341      PT LONG TERM GOAL #1   Title  Pt will be I and compliant with HEP. (Target for all goals 6 weeks 02/07/20)    Status  New      PT LONG TERM GOAL #2   Title  Pt will be able to perform long arc quad to full ROM to show improved overall quad strength needed for functional abilities.    Status  New      PT LONG TERM GOAL #3   Title  Pt will be able to perform stairs reciprocally with one handrail.    Status  New      PT LONG TERM GOAL #4   Title  Pt will improve FGA to at least 19 to show improved balance    Status  New      PT LONG TERM GOAL #5   Title  Pt will perform BERG balance test and then goal will be created.    Status  New            Plan - 01/09/20 1412    Clinical Impression Statement  Continued with quad strengthening program but he exhibits signs of UMN involvent including hyperreflexes and +babinski sign. PT had nurse pracitioner come take him down for imaging and further sceen by Dr. Sharol Given who relays he seems neuolgically  sound and appropriate for PT. PT will continue to progress with leg strength, gait, and balance as tolerated.    Personal Factors and Comorbidities  Comorbidity 3+;Comorbidity 2    Comorbidities  PMH: periheral neuropathy, HTN, status post Rt quad tendon reconstruction in 2015    Examination-Activity Limitations  Bend;Carry;Squat;Stairs;Lift;Stand;Locomotion Level    Examination-Participation Restrictions  Cleaning;Community Activity;Shop;Yard Work    Journalist, newspaper    Rehab Potential  Fair    PT Frequency  2x / week    PT Duration  6 weeks    PT Treatment/Interventions  ADLs/Self Care Home Management;Aquatic Therapy;Cryotherapy;Electrical Stimulation;Ultrasound;Gait training;Stair training;Functional mobility training;Therapeutic activities;Therapeutic exercise;Balance training;Neuromuscular re-education;Manual techniques;Passive range of motion;Dry needling;Taping    PT Next Visit Plan  test reflexes, test BERG for baseline, needs quad strength, gait,balance    PT Home Exercise Plan  Access Code: [email or web address]    Consulted and Agree with Plan of Care  Patient       Patient will benefit from skilled therapeutic intervention in order to improve the following deficits and impairments:  Abnormal gait, Decreased activity tolerance, Decreased endurance, Decreased balance, Decreased strength, Decreased range of motion, Difficulty walking  Visit Diagnosis: Muscle weakness (generalized)  Other abnormalities of gait and mobility     Problem List Patient Active Problem List   Diagnosis Date Noted  . 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 Weakness of right leg 11/28/2019  . Unsteady gait when walking- due to Quad muscle rupture and repair 2015 11/28/2019  . Vitamin D insufficiency 05/11/2019  . Upper airway cough syndrome 11/08/2018  . Current smoker-   1/2 ppd for 4 yrs or so 11/03/2018  . HTN, goal  below 140/90 04/15/2016  . Onychomycosis of toenail 11/30/2013  . MITRAL REGURGITATION- with heart murmur 05/18/2008  . 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 Hyperlipidemia 03/03/2008  . DEMENTIA  03/03/2008  . ANXIETY 03/03/2008  . 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 PERIPHERAL NEUROPATHY 03/03/2008  . COPD GOLD II active smoker 03/03/2008    Birdie Riddle 01/09/2020, 2:52 PM  [facility] Physical Therapy [address] [place], [place], [address] Phone: [phone]   Fax:  [phone]  Name: Cody Perez MRN: [id number] Date of Birth: 20-Oct-1951

## 2020-01-09 NOTE — Progress Notes (Signed)
Office Visit Note   Patient: Cody Perez           Date of Birth: [birth year]           MRN: [id number] Visit Date: 01/09/2020              Requested by: Mellody Dance, DO [place],  [place] [id number] PCP: Mellody Dance, DO  Chief Complaint  Patient presents with  . Neck - Pain  . Lower Back - Pain      HPI: Patient is a 69 year old gentleman who is seen as a work in for possible stenosis changes due to cervical and lumbar spine.  Patient has an antalgic gait and has been going to therapy for his quad tendon.  Patient states that his knee extension is actually improving.  Assessment & Plan: Visit Diagnoses:  1. Low back pain, unspecified back pain laterality, unspecified chronicity, unspecified whether sciatica present   2. Cervicalgia     Plan: We will continue with physical therapy patient has no radicular signs of either upper or lower extremity his gait abnormality seems to be more due to the strength that his knee.  Follow-Up Instructions: Return if symptoms worsen or fail to improve.   Ortho Exam  Patient is alert, oriented, no adenopathy, well-dressed, normal affect, normal respiratory effort. Examination of both upper extremities patient has no focal motor weakness in either upper extremity has good motor strength he denies any neck pain or radicular symptoms into his upper extremities.  Examination of lower extremities he has a negative straight leg raise bilaterally and no focal motor weakness in either lower extremity.  He lacks about 5 degrees of full extension of the right knee status post quad tendon reconstruction with quadriceps atrophy.  Patient states that his extension has improved with therapy he has a negative sciatic tension sign in both lower extremities with ambulation patient does have an antalgic gait but does not have a wide-based gait does not have a tremor does not have any loss of balance.  Imaging: XR Cervical Spine 2 or 3  views  Result Date: 01/09/2020 2 view radiographs of the cervical spine shows advanced degenerative disc disease throughout the entire cervical spine.  XR Lumbar Spine 2-3 Views  Result Date: 01/09/2020 2 view radiographs of the lumbar spine shows advanced degenerative disc disease of the lumbar spine with calcification of the aorta with aortic measurements less than 3 cm.  No images are attached to the encounter.  Labs: Lab Results  Component Value Date   HGBA1C 5.3 05/09/2019     Lab Results  Component Value Date   ALBUMIN 4.2 05/09/2019   ALBUMIN 4.1 09/20/2018   ALBUMIN 4.1 05/05/2017    No results found for: MG Lab Results  Component Value Date   VD25OH 34.7 05/09/2019    No results found for: PREALBUMIN CBC EXTENDED Latest Ref Rng & Units 05/09/2019 09/20/2018 05/05/2017  WBC 3.4 - 10.8 x10E3/uL 5.9 5.5 5.1  RBC 4.14 - 5.80 x10E6/uL 3.96(L) 3.92(L) 4.16(L)  HGB 13.0 - 17.7 g/dL 13.9 14.3 14.6  HCT 37.5 - 51.0 % 38.9 40.0 42.2  PLT 150 - 450 x10E3/uL 195 272.0 204.0  NEUTROABS 1.4 - 7.0 x10E3/uL 3.2 3.3 2.9  LYMPHSABS 0.7 - 3.1 x10E3/uL 2.1 1.8 1.7     Body mass index is 28.06 kg/m.  Orders:  Orders Placed This Encounter  Procedures  . XR Cervical Spine 2 or 3 views  . XR Lumbar Spine  2-3 Views   No orders of the defined types were placed in this encounter.    Procedures: No procedures performed  Clinical Data: No additional findings.  ROS:  All other systems negative, except as noted in the HPI. Review of Systems  Objective: Vital Signs: Ht 5\' 9"  (1.753 m)   Wt 190 lb (86.2 kg)   BMI 28.06 kg/m   Specialty Comments:  No specialty comments available.  PMFS History: Patient Active Problem List   Diagnosis Date Noted  . 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 Weakness of right leg 11/28/2019  . Unsteady gait when walking- due to Quad muscle rupture and repair 2015 11/28/2019  . Vitamin D  insufficiency 05/11/2019  . Upper airway cough syndrome 11/08/2018  . Current smoker-   1/2 ppd for 4 yrs or so 11/03/2018  . HTN, goal below 140/90 04/15/2016  . Onychomycosis of toenail 11/30/2013  . MITRAL REGURGITATION- with heart murmur 05/18/2008  . 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 Hyperlipidemia 03/03/2008  . DEMENTIA 03/03/2008  . ANXIETY 03/03/2008  . 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 PERIPHERAL NEUROPATHY 03/03/2008  . COPD GOLD II active smoker 03/03/2008   Past Medical History:  Diagnosis Date  . Hyperlipidemia   . Hypertension     Family History  Adopted: Yes    History reviewed. No pertinent surgical history. Social History   Occupational History  . Not on file  Tobacco Use  . Smoking status: Current Every Day Smoker    Packs/day: 0.50    Years: 10.00    Pack years: 5.00    Types: Cigarettes  . Smokeless tobacco: Never Used  Substance and Sexual Activity  . Alcohol use: Yes    Alcohol/week: 2.0 standard drinks    Types: 2 Standard drinks or equivalent per week  . Drug use: Never  . Sexual activity: Not Currently

## 2020-01-12 ENCOUNTER — Ambulatory Visit (INDEPENDENT_AMBULATORY_CARE_PROVIDER_SITE_OTHER): Payer: Medicare Other | Admitting: Physical Therapy

## 2020-01-12 ENCOUNTER — Other Ambulatory Visit: Payer: Self-pay

## 2020-01-12 DIAGNOSIS — M6281 Muscle weakness (generalized): Secondary | ICD-10-CM | POA: Diagnosis not present

## 2020-01-12 DIAGNOSIS — R2689 Other abnormalities of gait and mobility: Secondary | ICD-10-CM

## 2020-01-12 NOTE — Therapy (Signed)
[facility] Physical Therapy [address] [place], [place], [address] Phone: [phone]   Fax:  [phone]  Physical Therapy Treatment  Patient Details  Name: Cody Perez MRN: [id number] Date of Birth: August 31, 1951 Referring Provider (PT): Newt Minion, MD   Encounter Date: 01/12/2020  PT End of Session - 01/12/20 1201    Visit Number  3    Number of Visits  12    Date for PT Re-Evaluation  02/07/20    Authorization Type  MCR supplement    PT Start Time  1105    PT Stop Time  1147    PT Time Calculation (min)  42 min    Activity Tolerance  Patient tolerated treatment well    Behavior During Therapy  [facility] for tasks assessed/performed       Past Medical History:  Diagnosis Date  . Hyperlipidemia   . Hypertension     No past surgical history on file.  There were no vitals filed for this visit.  Subjective Assessment - 01/12/20 1135    Subjective  He says he was checked out by Dr. Sharol Given after last session and that MD says he is neurologically sound and appropriate to resume PT. He denies pain today but still having Leg weakness and gets off balance but overall feels he is getting a little improvement    Currently in Pain?  No/denies       [facility] Adult PT Treatment/Exercise - 01/12/20 0001      High Level Balance   High Level Balance Comments  tandem walk with one UE support X 2 reps at counter, sidestepping on foam beam X 2 reps no UE support      Knee/Hip Exercises: Stretches   Active Hamstring Stretch  Both;2 reps;30 seconds    Active Hamstring Stretch Limitations  seated    Quad Stretch  Right;Left;2 reps;30 seconds      Knee/Hip Exercises: Aerobic   Recumbent Bike  5 min L3      Knee/Hip Exercises: Machines for Strengthening   Total Gym Leg Press  bilat leg press 62 lbs X 20 reps with slow eccentric lower      Knee/Hip Exercises: Standing   Heel Raises Limitations  no UE support for balance, only able to peform in small ROM due to  weakness, 15 reps total      Knee/Hip Exercises: Seated   Long Arc Quad  Both;15 reps    Long Arc Quad Limitations  unable to achieve full ext due to weakness on Rt side    Sit to [employer]  15 reps;without UE support   from mat table, slighly raised up and focus on eccentrics     Knee/Hip Exercises: Supine   Quad Sets  Both;15 reps    Quad Sets Limitations  a    Hip Adduction Isometric  15 reps    Bridges  15 reps    Straight Leg Raises  Both;2 sets;10 reps    Straight Leg Raises Limitations  extension lag on Rt    Other Supine Knee/Hip Exercises  clams with L3 band X 15 reps                  PT Long Term Goals - 12/27/19 1341      PT LONG TERM GOAL #1   Title  Pt will be I and compliant with HEP. (Target for all goals 6 weeks 02/07/20)    Status  New      PT LONG  TERM GOAL #2   Title  Pt will be able to perform long arc quad to full ROM to show improved overall quad strength needed for functional abilities.    Status  New      PT LONG TERM GOAL #3   Title  Pt will be able to perform stairs reciprocally with one handrail.    Status  New      PT LONG TERM GOAL #4   Title  Pt will improve FGA to at least 19 to show improved balance    Status  New      PT LONG TERM GOAL #5   Title  Pt will perform BERG balance test and then goal will be created.    Status  New            Plan - 01/12/20 1201    Clinical Impression Statement  continued with bilat leg strengtheing program with heavy focus on quad strength. He was able to progress his program some but still has poor neuromuscular control in his quads and hyperxtension with exercises or extension lag. PT will try [place] NMES next visit to see if this helps improve quad contraction. He does appear to be improving some with balance some however.    Personal Factors and Comorbidities  Comorbidity 3+;Comorbidity 2    Comorbidities  PMH: periheral neuropathy, HTN, status post Rt quad tendon reconstruction in 2015     Examination-Activity Limitations  Bend;Carry;Squat;Stairs;Lift;Stand;Locomotion Level    Examination-Participation Restrictions  Cleaning;Community Activity;Shop;Yard Work    IT consultant    Rehab Potential  Fair    PT Frequency  2x / week    PT Duration  6 weeks    PT Treatment/Interventions  ADLs/Self Care Home Management;Aquatic Therapy;Cryotherapy;Electrical Stimulation;Ultrasound;Gait training;Stair training;Functional mobility training;Therapeutic activities;Therapeutic exercise;Balance training;Neuromuscular re-education;Manual techniques;Passive range of motion;Dry needling;Taping    PT Next Visit Plan  russian and or BFR. test BERG for baseline, needs quad strength, gait,balance    PT Home Exercise Plan  Access Code: [email or web address]    Consulted and Agree with Plan of Care  Patient       Patient will benefit from skilled therapeutic intervention in order to improve the following deficits and impairments:  Abnormal gait, Decreased activity tolerance, Decreased endurance, Decreased balance, Decreased strength, Decreased range of motion, Difficulty walking  Visit Diagnosis: Muscle weakness (generalized)  Other abnormalities of gait and mobility     Problem List Patient Active Problem List   Diagnosis Date Noted  . 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 Weakness of right leg 11/28/2019  . Unsteady gait when walking- due to Quad muscle rupture and repair 2015 11/28/2019  . Vitamin D insufficiency 05/11/2019  . Upper airway cough syndrome 11/08/2018  . Current smoker-   1/2 ppd for 4 yrs or so 11/03/2018  . HTN, goal below 140/90 04/15/2016  . Onychomycosis of toenail 11/30/2013  . MITRAL REGURGITATION- with heart murmur 05/18/2008  . 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 Hyperlipidemia 03/03/2008  . DEMENTIA 03/03/2008  . ANXIETY 03/03/2008  . 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 PERIPHERAL NEUROPATHY 03/03/2008  . COPD GOLD II active smoker 03/03/2008    Birdie Riddle 01/12/2020, 12:05 PM  [facility] Physical Therapy [address] [place], [place], [address] Phone: [phone]   Fax:  [phone]  Name: ABRON NEDDO MRN:  [id number] Date of Birth: 03-24-1951

## 2020-01-13 ENCOUNTER — Other Ambulatory Visit (HOSPITAL_COMMUNITY)
Admission: RE | Admit: 2020-01-13 | Discharge: 2020-01-13 | Disposition: A | Discharge status: Home / Self Care | Payer: Medicare Other | Source: Ambulatory Visit | Attending: Internal Medicine | Admitting: Internal Medicine

## 2020-01-13 DIAGNOSIS — Z20822 Contact with and (suspected) exposure to covid-19: Secondary | ICD-10-CM | POA: Insufficient documentation

## 2020-01-13 DIAGNOSIS — Z01812 Encounter for preprocedural laboratory examination: Secondary | ICD-10-CM | POA: Insufficient documentation

## 2020-01-13 LAB — SARS CORONAVIRUS 2 (TAT 6-24 HRS): SARS Coronavirus 2: NEGATIVE

## 2020-01-16 ENCOUNTER — Encounter: Payer: Medicare Other | Admitting: Physical Therapy

## 2020-01-17 ENCOUNTER — Ambulatory Visit (INDEPENDENT_AMBULATORY_CARE_PROVIDER_SITE_OTHER): Payer: Medicare Other | Admitting: Internal Medicine

## 2020-01-17 ENCOUNTER — Other Ambulatory Visit: Payer: Self-pay

## 2020-01-17 DIAGNOSIS — J449 Chronic obstructive pulmonary disease, unspecified: Secondary | ICD-10-CM

## 2020-01-17 LAB — PULMONARY FUNCTION TEST
DL/VA % pred: 74 %
DL/VA: 3.02 ml/min/mmHg/L
DLCO unc % pred: 78 %
DLCO unc: 20.44 ml/min/mmHg
FEF 25-75 Post: 1.51 L/sec
FEF 25-75 Pre: 1.48 L/sec
FEF2575-%Change-Post: 2 %
FEF2575-%Pred-Post: 61 %
FEF2575-%Pred-Pre: 59 %
FEV1-%Change-Post: 1 %
FEV1-%Pred-Post: 86 %
FEV1-%Pred-Pre: 84 %
FEV1-Post: 2.81 L
FEV1-Pre: 2.76 L
FEV1FVC-%Change-Post: -3 %
FEV1FVC-%Pred-Pre: 86 %
FEV6-%Change-Post: 5 %
FEV6-%Pred-Post: 106 %
FEV6-%Pred-Pre: 101 %
FEV6-Post: 4.47 L
FEV6-Pre: 4.24 L
FEV6FVC-%Change-Post: 0 %
FEV6FVC-%Pred-Post: 104 %
FEV6FVC-%Pred-Pre: 104 %
FVC-%Change-Post: 5 %
FVC-%Pred-Post: 102 %
FVC-%Pred-Pre: 97 %
FVC-Post: 4.55 L
FVC-Pre: 4.33 L
Post FEV1/FVC ratio: 62 %
Post FEV6/FVC ratio: 98 %
Pre FEV1/FVC ratio: 64 %
Pre FEV6/FVC Ratio: 99 %

## 2020-01-17 NOTE — Progress Notes (Signed)
PFT done today. 

## 2020-01-21 ENCOUNTER — Ambulatory Visit: Payer: Medicare Other | Attending: Internal Medicine

## 2020-01-21 DIAGNOSIS — Z23 Encounter for immunization: Secondary | ICD-10-CM

## 2020-01-21 NOTE — Progress Notes (Signed)
   Covid-19 Vaccination Clinic   Covid-19 Vaccination Clinic  Name:  FARAAZ WOLIN    MRN: [id number] DOB: Mar 31, 1951  01/21/2020  Mr. Foushee was observed post Covid-19 immunization for 15 minutes without incidence. He was provided with Vaccine Information Sheet and instruction to access the V-Safe system.   Mr. Lazcano was instructed to call 911 with any severe reactions post vaccine: Marland Kitchen Difficulty breathing  . Swelling of your face and throat  . A fast heartbeat  . A bad rash all over your body  . Dizziness and weakness    Immunizations Administered    Name Date Dose VIS Date Route   Pfizer COVID-19 Vaccine 01/21/2020  4:12 PM 0.3 mL 11/24/2019 Intramuscular   Manufacturer: [employer], [employer]   Lot: [id number]   NDC: [address]-1

## 2020-01-23 ENCOUNTER — Ambulatory Visit (INDEPENDENT_AMBULATORY_CARE_PROVIDER_SITE_OTHER): Payer: Medicare Other | Admitting: Physical Therapy

## 2020-01-23 ENCOUNTER — Other Ambulatory Visit: Payer: Self-pay

## 2020-01-23 DIAGNOSIS — R2689 Other abnormalities of gait and mobility: Secondary | ICD-10-CM | POA: Diagnosis not present

## 2020-01-23 DIAGNOSIS — M6281 Muscle weakness (generalized): Secondary | ICD-10-CM

## 2020-01-23 NOTE — Therapy (Signed)
[facility] Physical Therapy [address] [place], [place], [address] Phone: [phone]   Fax:  [phone]  Physical Therapy Treatment  Patient Details  Name: Cody Perez MRN: [id number] Date of Birth: 1951-08-06 Referring Provider (PT): Nadara Mustard, MD   Encounter Date: 01/23/2020  PT End of Session - 01/23/20 1303    Visit Number  4    Number of Visits  12    Date for PT Re-Evaluation  02/07/20    Authorization Type  MCR supplement    PT Start Time  1145    PT Stop Time  1230    PT Time Calculation (min)  45 min    Activity Tolerance  Patient tolerated treatment well    Behavior During Therapy  [facility] for tasks assessed/performed       Past Medical History:  Diagnosis Date  . Hyperlipidemia   . Hypertension     No past surgical history on file.  There were no vitals filed for this visit.  Subjective Assessment - 01/23/20 1302    Subjective  He says he feels he is improving some, denies pain    Pertinent History  PMH: periheral neuropathy, HTN, status post Rt quad tendon reconstruction in 2015    Limitations  Lifting;Standing;Walking;House hold activities    How long can you stand comfortably?  30 min    How long can you walk comfortably?  20 min    Patient Stated Goals  improve leg strength and balance    Currently in Pain?  No/denies         [facility] PT Assessment - 01/23/20 0001      Assessment   Medical Diagnosis  Rt quad weakness, gait/balance    Referring Provider (PT)  Nadara Mustard, MD      Standardized Balance Assessment   Standardized Balance Assessment  Berg Balance Test      Berg Balance Test   Sit to Stand  Able to stand without using hands and stabilize independently    Standing Unsupported  Able to stand safely 2 minutes    Sitting with Back Unsupported but Feet Supported on Floor or Stool  Able to sit safely and securely 2 minutes    Stand to Sit  Sits safely with minimal use of hands    Transfers  Able to transfer  safely, minor use of hands    Standing Unsupported with Eyes Closed  Able to stand 3 seconds    Standing Unsupported with Feet Together  Able to place feet together independently and stand for 1 minute with supervision    From Standing, Reach Forward with Outstretched Arm  Can reach forward >12 cm safely (5")    From Standing Position, Pick up Object from Floor  Able to pick up shoe, needs supervision    From Standing Position, Turn to Look Behind Over each Shoulder  Looks behind from both sides and weight shifts well    Turn 360 Degrees  Able to turn 360 degrees safely but slowly    Standing Unsupported, Alternately Place Feet on Step/Stool  Able to complete >2 steps/needs minimal assist    Standing Unsupported, One Foot in Front  Needs help to step but can hold 15 seconds    Standing on One Leg  Tries to lift leg/unable to hold 3 seconds but remains standing independently    Total Score  40  [facility] Adult PT Treatment/Exercise - 01/23/20 0001      Knee/Hip Exercises: Aerobic   Nustep  5 min L5 UE/LE      Knee/Hip Exercises: Machines for Strengthening   Total Gym Leg Press  bilat leg press 62 lbs X 20 reps with slow eccentric lower      Knee/Hip Exercises: Standing   Heel Raises  20 reps      Knee/Hip Exercises: Seated   Long Arc Quad  Both    Long Arc Quad Limitations  5 min with NMES to Rt leg     Sit to [employer]  15 reps;without UE support      Knee/Hip Exercises: Supine   Quad Sets  Both    Quad Sets Limitations  with NMES to Rt side 5 min    Straight Leg Raises  Right    Straight Leg Raises Limitations  5 min with NMES, extension lag noted      Modalities   Modalities  Geologist, engineering Location  Rt quads    Electrical Stimulation Action  NMES    russian 5 sec on/5 sec off for 15 min with threx above   Electrical Stimulation Parameters  russian 5 sec on/5 sec off for 15 min with threx  above    Electrical Stimulation Goals  Strength;Neuromuscular facilitation                  PT Long Term Goals - 01/23/20 1309      PT LONG TERM GOAL #1   Title  Pt will be I and compliant with HEP. (Target for all goals 6 weeks 02/07/20)    Status  On-going      PT LONG TERM GOAL #2   Title  Pt will be able to perform long arc quad to full ROM to show improved overall quad strength needed for functional abilities.    Status  On-going      PT LONG TERM GOAL #3   Title  Pt will be able to perform stairs reciprocally with one handrail.    Status  On-going      PT LONG TERM GOAL #4   Title  Pt will improve FGA to at least 19 to show improved balance    Status  On-going      PT LONG TERM GOAL #5   Title  Pt will improve BERG balance to >45    Status  Revised            Plan - 01/23/20 1304    Clinical Impression Statement  Did have some improvement with LAQ ROM today and he feels he is progressing with strength and balance. Peformed NMES russian Estim for improved quad contraction with exercises today. PT will continue to progress as able    Personal Factors and Comorbidities  Comorbidity 3+;Comorbidity 2    Comorbidities  PMH: periheral neuropathy, HTN, status post Rt quad tendon reconstruction in 2015    Examination-Activity Limitations  Bend;Carry;Squat;Stairs;Lift;Stand;Locomotion Level    Examination-Participation Restrictions  Cleaning;Community Activity;Shop;Yard Work    IT consultant    Rehab Potential  Fair    PT Frequency  2x / week    PT Duration  6 weeks    PT Treatment/Interventions  ADLs/Self Care Home Management;Aquatic Therapy;Cryotherapy;Electrical Stimulation;Ultrasound;Gait training;Stair training;Functional mobility training;Therapeutic activities;Therapeutic exercise;Balance training;Neuromuscular re-education;Manual techniques;Passive range of motion;Dry needling;Taping    PT Next Visit Plan   russian  and or BFR. test BERG for baseline, needs quad strength, gait,balance    PT Home Exercise Plan  Access Code: [email or web address]    Consulted and Agree with Plan of Care  Patient       Patient will benefit from skilled therapeutic intervention in order to improve the following deficits and impairments:  Abnormal gait, Decreased activity tolerance, Decreased endurance, Decreased balance, Decreased strength, Decreased range of motion, Difficulty walking  Visit Diagnosis: Muscle weakness (generalized)  Other abnormalities of gait and mobility     Problem List Patient Active Problem List   Diagnosis Date Noted  . 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 Weakness of right leg 11/28/2019  . Unsteady gait when walking- due to Quad muscle rupture and repair 2015 11/28/2019  . Vitamin D insufficiency 05/11/2019  . Upper airway cough syndrome 11/08/2018  . Current smoker-   1/2 ppd for 4 yrs or so 11/03/2018  . HTN, goal below 140/90 04/15/2016  . Onychomycosis of toenail 11/30/2013  . MITRAL REGURGITATION- with heart murmur 05/18/2008  . 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 Hyperlipidemia 03/03/2008  . DEMENTIA 03/03/2008  . ANXIETY 03/03/2008  . Major depressive disorder, single episode with psychotic features ([place])- txed by psychiatry 03/03/2008  . PERIPHERAL NEUROPATHY 03/03/2008  . COPD GOLD II active smoker 03/03/2008    Silvestre Mesi 01/23/2020, 1:11 PM  [facility] Physical Therapy [address] [place], [place], [address] Phone: [phone]   Fax:  [phone]  Name: Cody Perez MRN: [id number] Date of Birth: Apr 30, 1951

## 2020-01-24 ENCOUNTER — Other Ambulatory Visit: Payer: Self-pay | Admitting: Family Medicine

## 2020-01-30 ENCOUNTER — Other Ambulatory Visit: Payer: Self-pay

## 2020-01-30 ENCOUNTER — Ambulatory Visit (INDEPENDENT_AMBULATORY_CARE_PROVIDER_SITE_OTHER): Payer: Medicare Other | Admitting: Physical Therapy

## 2020-01-30 DIAGNOSIS — R2689 Other abnormalities of gait and mobility: Secondary | ICD-10-CM

## 2020-01-30 DIAGNOSIS — M6281 Muscle weakness (generalized): Secondary | ICD-10-CM | POA: Diagnosis not present

## 2020-01-30 NOTE — Therapy (Addendum)
[facility] Physical Therapy [address] [place], [place], [address] Phone: [phone]   Fax:  [phone]  Physical Therapy Treatment/Discharge addendum PHYSICAL THERAPY DISCHARGE SUMMARY  Visits from Start of Care: 5  Current functional level related to goals / functional outcomes: See below   Remaining deficits: Still with leg weakness   Education / Equipment: HEP  Plan: Patient agrees to discharge.  Patient goals were not met. Patient is being discharged due to not returning since the last visit.  ?????  [name], PT, DPT 05/16/20 2:18 PM       Patient Details  Name: Cody Perez MRN: [id number] Date of Birth: [birth year] Referring Provider (PT): Newt Minion, MD   Encounter Date: 01/30/2020  PT End of Session - 01/30/20 1307    Visit Number  5    Number of Visits  12    Date for PT Re-Evaluation  02/07/20    Authorization Type  MCR supplement    PT Start Time  1145    PT Stop Time  1230    PT Time Calculation (min)  45 min    Activity Tolerance  Patient tolerated treatment well    Behavior During Therapy  [facility] for tasks assessed/performed       Past Medical History:  Diagnosis Date  . Hyperlipidemia   . Hypertension     No past surgical history on file.  There were no vitals filed for this visit.  Subjective Assessment - 01/30/20 1222    Subjective  no soreness after last time, no pain today but still with leg weakness    Pertinent History  PMH: periheral neuropathy, HTN, status post Rt quad tendon reconstruction in 2015    Limitations  Lifting;Standing;Walking;House hold activities    How long can you stand comfortably?  30 min    How long can you walk comfortably?  20 min    Patient Stated Goals  improve leg strength and balance         [facility] Adult PT Treatment/Exercise - 01/30/20 0001      Knee/Hip Exercises: Aerobic   Nustep  5 min L5 UE/LE      Knee/Hip Exercises: Machines for Strengthening   Total Gym Leg  Press  bilat leg press 75 lbs 2x15 reps with slow eccentric lower      Knee/Hip Exercises: Standing   Heel Raises  20 reps    Forward Step Up  Both;10 reps;Step Height: 6";Hand Hold: 2    Forward Step Up Limitations  focus on slow control of kne hyperextension      Knee/Hip Exercises: Seated   Long Arc Quad  Both    Long Arc Quad Limitations  5 min with NMES to Rt leg     Sit to [employer]  15 reps;without UE support      Knee/Hip Exercises: Supine   Quad Sets  Both    Quad Sets Limitations  with NMES to Rt leg 5 min    Straight Leg Raises  Right    Straight Leg Raises Limitations  3 min with NMES, extension lag noted      Acupuncturist Stimulation Location  Rt quads    Electrical Stimulation Action  NMES    Electrical Stimulation Parameters  [place] 5/5 sec on/off    Electrical Stimulation Goals  Strength;Neuromuscular facilitation                  PT Long Term Goals - 01/23/20 1309  PT LONG TERM GOAL #1   Title  Pt will be I and compliant with HEP. (Target for all goals 6 weeks 02/07/20)    Status  On-going      PT LONG TERM GOAL #2   Title  Pt will be able to perform long arc quad to full ROM to show improved overall quad strength needed for functional abilities.    Status  On-going      PT LONG TERM GOAL #3   Title  Pt will be able to perform stairs reciprocally with one handrail.    Status  On-going      PT LONG TERM GOAL #4   Title  Pt will improve FGA to at least 19 to show improved balance    Status  On-going      PT LONG TERM GOAL #5   Title  Pt will improve BERG balance to >45    Status  Revised            Plan - 01/30/20 1308    Clinical Impression Statement  Continued with NMES russian Estim for quad contraction and activation with therex at end of session after progression of closed chain strengthening prior. He continues to have weakness but is slowly improving this some and gait is slowly improving. Continue POC     Personal Factors and Comorbidities  Comorbidity 3+;Comorbidity 2    Comorbidities  PMH: periheral neuropathy, HTN, status post Rt quad tendon reconstruction in 2015    Examination-Activity Limitations  Bend;Carry;Squat;Stairs;Lift;Stand;Locomotion Level    Examination-Participation Restrictions  Cleaning;Community Activity;Shop;Yard Work    Journalist, newspaper    Rehab Potential  Fair    PT Frequency  2x / week    PT Duration  6 weeks    PT Treatment/Interventions  ADLs/Self Care Home Management;Aquatic Therapy;Cryotherapy;Electrical Stimulation;Ultrasound;Gait training;Stair training;Functional mobility training;Therapeutic activities;Therapeutic exercise;Balance training;Neuromuscular re-education;Manual techniques;Passive range of motion;Dry needling;Taping    PT Next Visit Plan  russian and or BFR. needs quad strength, gait,balance    PT Home Exercise Plan  Access Code: [email or web address]    Consulted and Agree with Plan of Care  Patient       Patient will benefit from skilled therapeutic intervention in order to improve the following deficits and impairments:  Abnormal gait, Decreased activity tolerance, Decreased endurance, Decreased balance, Decreased strength, Decreased range of motion, Difficulty walking  Visit Diagnosis: Muscle weakness (generalized)  Other abnormalities of gait and mobility     Problem List Patient Active Problem List   Diagnosis Date Noted  . 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 Weakness of right leg 11/28/2019  . Unsteady gait when walking- due to Quad muscle rupture and repair 2015 11/28/2019  . Vitamin D insufficiency 05/11/2019  . Upper airway cough syndrome 11/08/2018  . Current smoker-   1/2 ppd for 4 yrs or so 11/03/2018  . HTN, goal below 140/90 04/15/2016  . Onychomycosis of toenail 11/30/2013  . MITRAL REGURGITATION- with heart murmur 05/18/2008  . OTHER  ACQUIRED DEFORMITY OF ANKLE AND FOOT OTHER 05/18/2008  . Hyperlipidemia 03/03/2008  . DEMENTIA 03/03/2008  . ANXIETY 03/03/2008  . Major depressive disorder, single episode with psychotic features ([place])- txed by psychiatry 03/03/2008  . PERIPHERAL NEUROPATHY 03/03/2008  . COPD GOLD II active smoker 03/03/2008    Silvestre Mesi 01/30/2020, 1:10 PM  [facility] Physical Therapy [address] [place], [place], [address] Phone: [phone]   Fax:  [phone]  Name: Cody Perez MRN: [id number] Date of Birth: [birth year]

## 2020-02-11 ENCOUNTER — Ambulatory Visit: Payer: Medicare Other

## 2020-02-15 ENCOUNTER — Ambulatory Visit: Payer: Medicare Other | Attending: Internal Medicine

## 2020-02-15 DIAGNOSIS — Z23 Encounter for immunization: Secondary | ICD-10-CM

## 2020-02-15 NOTE — Progress Notes (Signed)
   Covid-19 Vaccination Clinic  Name:  Cody Perez    MRN: [id number] DOB: 10/14/1951  02/15/2020  [name] was observed post Covid-19 immunization for 15 minutes without incident. He was provided with Vaccine Information Sheet and instruction to access the V-Safe system.   [name] was instructed to call 911 with any severe reactions post vaccine: Marland Kitchen Difficulty breathing  . Swelling of face and throat  . A fast heartbeat  . A bad rash all over body  . Dizziness and weakness   Immunizations Administered    Name Date Dose VIS Date Route   Pfizer COVID-19 Vaccine 02/15/2020 10:32 AM 0.3 mL 11/24/2019 Intramuscular   Manufacturer: [employer], [employer]   Lot: [id number]   NDC: [address]-2

## 2020-02-17 ENCOUNTER — Other Ambulatory Visit: Payer: Self-pay | Admitting: Family Medicine

## 2020-02-27 ENCOUNTER — Other Ambulatory Visit: Payer: Self-pay | Admitting: Family Medicine

## 2020-03-01 ENCOUNTER — Other Ambulatory Visit: Payer: Self-pay | Admitting: Family Medicine

## 2020-03-01 DIAGNOSIS — E7800 Pure hypercholesterolemia, unspecified: Secondary | ICD-10-CM

## 2020-04-02 ENCOUNTER — Ambulatory Visit (INDEPENDENT_AMBULATORY_CARE_PROVIDER_SITE_OTHER): Payer: Medicare Other | Admitting: Physician Assistant

## 2020-04-02 ENCOUNTER — Encounter (HOSPITAL_COMMUNITY): Payer: Self-pay

## 2020-04-02 ENCOUNTER — Emergency Department (HOSPITAL_COMMUNITY)
Admission: EM | Admit: 2020-04-02 | Discharge: 2020-04-02 | Disposition: A | Discharge status: Home / Self Care | Payer: Medicare Other | Attending: Emergency Medicine | Admitting: Emergency Medicine

## 2020-04-02 ENCOUNTER — Emergency Department (HOSPITAL_COMMUNITY): Payer: Medicare Other

## 2020-04-02 ENCOUNTER — Other Ambulatory Visit: Payer: Self-pay

## 2020-04-02 ENCOUNTER — Encounter: Payer: Self-pay | Admitting: Physician Assistant

## 2020-04-02 VITALS — BP 156/88 | HR 150 | Temp 98.9°F | Ht 69.0 in | Wt 186.2 lb

## 2020-04-02 DIAGNOSIS — R000 Tachycardia, unspecified: Secondary | ICD-10-CM | POA: Diagnosis not present

## 2020-04-02 DIAGNOSIS — I10 Essential (primary) hypertension: Secondary | ICD-10-CM | POA: Diagnosis not present

## 2020-04-02 DIAGNOSIS — Z79899 Other long term (current) drug therapy: Secondary | ICD-10-CM | POA: Diagnosis not present

## 2020-04-02 DIAGNOSIS — Z7982 Long term (current) use of aspirin: Secondary | ICD-10-CM | POA: Diagnosis not present

## 2020-04-02 DIAGNOSIS — R29898 Other symptoms and signs involving the musculoskeletal system: Secondary | ICD-10-CM

## 2020-04-02 DIAGNOSIS — R009 Unspecified abnormalities of heart beat: Secondary | ICD-10-CM | POA: Diagnosis not present

## 2020-04-02 DIAGNOSIS — F17210 Nicotine dependence, cigarettes, uncomplicated: Secondary | ICD-10-CM | POA: Insufficient documentation

## 2020-04-02 LAB — CBC
HCT: 44.7 % (ref 39.0–52.0)
Hemoglobin: 15 g/dL (ref 13.0–17.0)
MCH: 34.6 pg — ABNORMAL HIGH (ref 26.0–34.0)
MCHC: 33.6 g/dL (ref 30.0–36.0)
MCV: 103.2 fL — ABNORMAL HIGH (ref 80.0–100.0)
Platelets: 195 10*3/uL (ref 150–400)
RBC: 4.33 MIL/uL (ref 4.22–5.81)
RDW: 13.6 % (ref 11.5–15.5)
WBC: 5 10*3/uL (ref 4.0–10.5)
nRBC: 0 % (ref 0.0–0.2)

## 2020-04-02 LAB — TSH: TSH: 0.882 u[IU]/mL (ref 0.350–4.500)

## 2020-04-02 LAB — COMPREHENSIVE METABOLIC PANEL
ALT: 23 U/L (ref 0–44)
AST: 43 U/L — ABNORMAL HIGH (ref 15–41)
Albumin: 3.7 g/dL (ref 3.5–5.0)
Alkaline Phosphatase: 120 U/L (ref 38–126)
Anion gap: 11 (ref 5–15)
BUN: <5 mg/dL — ABNORMAL LOW (ref 8–23)
CO2: 25 mmol/L (ref 22–32)
Calcium: 9 mg/dL (ref 8.9–10.3)
Chloride: 101 mmol/L (ref 98–111)
Creatinine, Ser: 0.98 mg/dL (ref 0.61–1.24)
GFR calc Af Amer: >60 mL/min (ref >60)
GFR calc non Af Amer: >60 mL/min (ref >60)
Glucose, Bld: 114 mg/dL — ABNORMAL HIGH (ref 70–99)
Potassium: 4.2 mmol/L (ref 3.5–5.1)
Sodium: 137 mmol/L (ref 135–145)
Total Bilirubin: 0.7 mg/dL (ref 0.3–1.2)
Total Protein: 7.1 g/dL (ref 6.5–8.1)

## 2020-04-02 LAB — T4, FREE: Free T4: 0.81 ng/dL (ref 0.61–1.12)

## 2020-04-02 MED ORDER — IRBESARTAN 150 MG PO TABS: 150.0000 mg | ORAL_TABLET | Freq: Every day | ORAL | 0 refills | Status: DC

## 2020-04-02 MED ORDER — IRBESARTAN 150 MG PO TABS: 150.0000 mg | ORAL_TABLET | Freq: Every day | ORAL | 1 refills | Status: DC

## 2020-04-02 NOTE — ED Provider Notes (Signed)
[facility] DEPART[facility] Provider Note   CSN: [id number] Arrival date & time: 04/02/20  1220     History Chief Complaint  Patient presents with  . Tachycardia    Cody Perez is a 69 y.o. male.  Patient with hx htn, presents from pcp office with high heart rate. Patient indicates was there for routine visit, stating he felt fine, no acute symptoms. Was noted to have heart rate 150. Pt denies hx dysrhythmia, svt or afib. Denies any sense of palpitations, rapid, or irregular heart beating. Pt denies faintness or syncope. No current or recent chest pain or discomfort. No sob. No abd pain or nvd. No dysuria. No fever or chills. Denies change in meds or new meds. No recent wt loss. Normal appetite.   The history is provided by the patient.       Past Medical History:  Diagnosis Date  . Hyperlipidemia   . Hypertension     Patient Active Problem List   Diagnosis Date Noted  . 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 Weakness of right leg 11/28/2019  . Unsteady gait when walking- due to Quad muscle rupture and repair 2015 11/28/2019  . Vitamin D insufficiency 05/11/2019  . Upper airway cough syndrome 11/08/2018  . Current smoker-   1/2 ppd for 4 yrs or so 11/03/2018  . HTN, goal below 140/90 04/15/2016  . Onychomycosis of toenail 11/30/2013  . MITRAL REGURGITATION- with heart murmur 05/18/2008  . 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 Hyperlipidemia 03/03/2008  . DEMENTIA 03/03/2008  . ANXIETY 03/03/2008  . 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 PERIPHERAL NEUROPATHY 03/03/2008  . COPD GOLD II active smoker 03/03/2008    History reviewed. No pertinent surgical history.     Family History  Adopted: Yes    Social History   Tobacco Use  . Smoking status: Current Every Day Smoker    Packs/day: 0.50    Years:  10.00    Pack years: 5.00    Types: Cigarettes  . Smokeless tobacco: Never Used  Substance Use Topics  . Alcohol use: Yes    Alcohol/week: 2.0 standard drinks    Types: 2 Standard drinks or equivalent per week  . Drug use: Never    Home Medications Prior to Admission medications   Medication Sig Start Date End Date Taking? Authorizing Provider  ABILIFY 10 MG tablet Take 5 mg by mouth daily.  09/09/12   [provider]  aspirin 325 MG tablet Take 325 mg by mouth daily.    [provider]  B Complex-C-Folic Acid (STRESS B COMPLEX PO) Take 1 tablet by mouth daily.    [provider]  clonazePAM (KLONOPIN) 1 MG tablet Take 1 mg by mouth 2 (two) times daily.  09/23/12   [provider]  donepezil (ARICEPT) 10 MG tablet 1 daily Patient taking differently: Take 10 mg by mouth at bedtime. 1 daily 11/30/13   Dorena Cookey, MD  irbesartan (AVAPRO) 150 MG tablet TAKE 1 TABLET (150 MG TOTAL) BY MOUTH DAILY. **PATIENT NEEDS APT FOR FURTHER REFILLS** 02/19/20   Mellody Dance, DO  lamoTRIgine (LAMICTAL) 200 MG tablet Take 200 mg by mouth daily.  09/09/12   [provider]  Multiple Vitamin (MULTIVITAMIN) capsule Take 1 capsule by mouth daily.    [provider]  QUEtiapine (SEROQUEL) 300 MG tablet Take  300 mg by mouth at bedtime.  09/19/12   [provider]  risperiDONE (RISPERDAL) 3 MG tablet Take 3 mg by mouth daily.  09/09/12   [provider]  simvastatin (ZOCOR) 10 MG tablet TAKE 1 TABLET BY MOUTH EVERYDAY AT BEDTIME 03/01/20   Thomasene Lot, DO    Allergies    Patient has no known allergies.  Review of Systems   Review of Systems  Constitutional: Negative for fever.  HENT: Negative for sore throat.   Eyes: Negative for redness.  Respiratory: Negative for cough and shortness of breath.   Cardiovascular: Negative for chest pain, palpitations and leg swelling.  Gastrointestinal: Negative for abdominal pain, blood in  stool, diarrhea and vomiting.  Endocrine: Negative for polyuria.  Genitourinary: Negative for dysuria and flank pain.  Musculoskeletal: Negative for back pain and neck pain.  Skin: Negative for rash.  Neurological: Negative for syncope, numbness and headaches.  Hematological: Does not bruise/bleed easily.  Psychiatric/Behavioral: Negative for confusion.    Physical Exam Updated Vital Signs BP (!) 156/117 (BP Location: Left Arm)   Pulse (!) 138   Temp 99 F (37.2 C) (Oral)   Resp 20   Ht 1.753 m (5\' 9" )   Wt 84.5 kg   SpO2 100%   BMI 27.51 kg/m   Physical Exam Vitals and nursing note reviewed.  Constitutional:      Appearance: Normal appearance. He is well-developed.  HENT:     Head: Atraumatic.     Nose: Nose normal.     Mouth/Throat:     Mouth: Mucous membranes are moist.     Pharynx: Oropharynx is clear.  Eyes:     General: No scleral icterus.    Conjunctiva/sclera: Conjunctivae normal.  Neck:     Trachea: No tracheal deviation.     Comments: Thyroid not grossly enlarged or tender. Cardiovascular:     Rate and Rhythm: Regular rhythm. Tachycardia present.     Pulses: Normal pulses.     Heart sounds: Normal heart sounds. No murmur. No friction rub. No gallop.   Pulmonary:     Effort: Pulmonary effort is normal. No accessory muscle usage or respiratory distress.     Breath sounds: Normal breath sounds.  Abdominal:     General: Bowel sounds are normal. There is no distension.     Palpations: Abdomen is soft. There is no mass.     Tenderness: There is no abdominal tenderness. There is no guarding or rebound.     Hernia: No hernia is present.  Genitourinary:    Comments: No cva tenderness. Musculoskeletal:        General: No swelling or tenderness.     Cervical back: Normal range of motion and neck supple. No rigidity.     Right lower leg: No edema.     Left lower leg: No edema.  Skin:    General: Skin is warm and dry.     Findings: No rash.  Neurological:      Mental Status: He is alert.     Comments: Alert, speech clear.   Psychiatric:        Mood and Affect: Mood normal.     ED Results / Procedures / Treatments   Labs (all labs ordered are listed, but only abnormal results are displayed) Results for orders placed or performed during the hospital encounter of 04/02/20  CBC  Result Value Ref Range   WBC 5.0 4.0 - 10.5 K/uL   RBC 4.33 4.22 - 5.81 MIL/uL  Hemoglobin 15.0 13.0 - 17.0 g/dL   HCT 02.7 25.3 - 66.4 %   MCV 103.2 (H) 80.0 - 100.0 fL   MCH 34.6 (H) 26.0 - 34.0 pg   MCHC 33.6 30.0 - 36.0 g/dL   RDW 40.3 47.4 - 25.9 %   Platelets 195 150 - 400 K/uL   nRBC 0.0 0.0 - 0.2 %  Comprehensive metabolic panel  Result Value Ref Range   Sodium 137 135 - 145 mmol/L   Potassium 4.2 3.5 - 5.1 mmol/L   Chloride 101 98 - 111 mmol/L   CO2 25 22 - 32 mmol/L   Glucose, Bld 114 (H) 70 - 99 mg/dL   BUN <5 (L) 8 - 23 mg/dL   Creatinine, Ser 5.63 0.61 - 1.24 mg/dL   Calcium 9.0 8.9 - 87.5 mg/dL   Total Protein 7.1 6.5 - 8.1 g/dL   Albumin 3.7 3.5 - 5.0 g/dL   AST 43 (H) 15 - 41 U/L   ALT 23 0 - 44 U/L   Alkaline Phosphatase 120 38 - 126 U/L   Total Bilirubin 0.7 0.3 - 1.2 mg/dL   GFR calc non Af Amer >60 >60 mL/min   GFR calc Af Amer >60 >60 mL/min   Anion gap 11 5 - 15  T4, free  Result Value Ref Range   Free T4 0.81 0.61 - 1.12 ng/dL  TSH  Result Value Ref Range   TSH 0.882 0.350 - 4.500 uIU/mL   DG Chest Port 1 View  Result Date: 04/02/2020 CLINICAL DATA:  Weakness, tachycardia EXAM: PORTABLE CHEST 1 VIEW COMPARISON:  09/20/2018 FINDINGS: The heart size and mediastinal contours are within normal limits. Both lungs are clear. The visualized skeletal structures are unremarkable. IMPRESSION: No active disease. Electronically Signed   By: Elige Ko   On: 04/02/2020 13:43    EKG EKG Interpretation  Date/Time:  Tuesday April 02 2020 12:36:26 EDT Ventricular Rate:  134 PR Interval:    QRS Duration: 85 QT Interval:  274 QTC  Calculation: 409 R Axis:   53 Text Interpretation: Sinus tachycardia Ventricular premature complex Baseline wander Confirmed by Cathren Laine ([id number]) on 04/02/2020 12:41:22 PM   Radiology DG Chest Port 1 View  Result Date: 04/02/2020 CLINICAL DATA:  Weakness, tachycardia EXAM: PORTABLE CHEST 1 VIEW COMPARISON:  09/20/2018 FINDINGS: The heart size and mediastinal contours are within normal limits. Both lungs are clear. The visualized skeletal structures are unremarkable. IMPRESSION: No active disease. Electronically Signed   By: Elige Ko   On: 04/02/2020 13:43    Procedures Procedures (including critical care time)  Medications Ordered in ED Medications - No data to display  ED Course  I have reviewed the triage vital signs and the nursing notes.  Pertinent labs & imaging results that were available during my care of the patient were reviewed by me and considered in my medical decision making (see chart for details).    MDM Rules/Calculators/A&P                      Iv ns. Continuous pulse ox and monitor. Stat labs. Imaging.   Reviewed nursing notes and prior charts for additional history.  pcp office noted to have hr 150.   Initial labs reviewed/interpreted by me - chem normal. hgb normal.   CXR reviewed/interpreted by me - no pna.   Repeat ecg.   Pt ambulatory to bathroom, no faintness or dizziness. Pt continues to indicate he feels fine, no symptoms. No  pain. No chest discomfort. No faintness or dizziness. No nv. No fever or chills.  Spouse present, additional hx. States one prior episode fast hr when at doctors office, but no hx dysrhythmia. Indicates out of his bp med for past several days, but otherwise no recent change in meds. Moderate caffeine use, not today though.  Pt continues to remain asymptomatic, and indicates ready for d/c.   Repeat ecg, sinus rhythm.       Final Clinical Impression(s) / ED Diagnoses Final diagnoses:  None    Rx / DC Orders ED  Discharge Orders    None       Cathren Laine, MD 04/02/20 1434

## 2020-04-02 NOTE — Discharge Instructions (Addendum)
It was our pleasure to provide your ER care today - we hope that you feel better.  Your blood pressure is high today - continue your blood pressure medication, limit salt intake,  limit salt intake, and follow up with primary care doctor in 1-2 weeks.   Return to ER if worse, new symptoms, new pain, chest pain, trouble breathing, fast heart beat, faint, or other concern.

## 2020-04-02 NOTE — ED Triage Notes (Signed)
Pt BIB [facility] for eval of tachycardia from PCP office today. Went in for BP med refill, noted to have HR in the 150s at MD office. Pt was hypertensive as well, however he has not had his BP meds for 5 days as well. Pt denies acute complaints, denies CP, palpitations, SOB. EMS admin 500cc NS w/ no change in rate. Arrives 130s-140s ST.

## 2020-04-02 NOTE — ED Notes (Signed)
Patient verbalizes understanding of discharge instructions. Opportunity for questioning and answers were provided. Armband removed by staff, pt discharged from ED ambulatory w, wife

## 2020-04-02 NOTE — Progress Notes (Signed)
Established Patient Office Visit  Subjective:  Patient ID: Cody Perez, male    DOB: [birth year]  Age: 69 y.o. MRN: [id number]  CC:  Chief Complaint  Patient presents with  . Hypertension  . Memory Loss    HPI Cody Perez presents for refill on BP medication. States has been out for 1 week.  Elevated Heart Rate: - Patient is asymptomatic. Denies chest pain, palpitations, dizziness, shortness of breath, or headache. - Patient denies any new medications including OTC, increased stress or anxiety. - States he feels fine.  Hypertension: -  His blood pressure at home has been running: 130s/90s - He checks it about once/wk and usually doesn't check pulse. - Patient reports good compliance with medication.  - Denies side effects. - He denies new onset of: chest pain, exercise intolerance, shortness of breath, dizziness, visual changes, headache, lower extremity swelling or claudication.   Today their BP is 156/88  Last 3 blood pressure readings in our office are as follows: BP Readings from Last 3 Encounters:  04/02/20 (!) 154/89  04/02/20 (!) 156/88  11/28/19 (!) 156/81   Weakness of right leg: - He followed-up with Dr. Lajoyce Corners and is doing PT at home. - Feels like PT is helping and has noticed some improvement in his weakness.   Filed Weights   04/02/20 1015  Weight: 186 lb 3.2 oz (84.5 kg)      Past Medical History:  Diagnosis Date  . Hyperlipidemia   . Hypertension     History reviewed. No pertinent surgical history.  Family History  Adopted: Yes    Social History   Socioeconomic History  . Marital status: Married    Spouse name: Not on file  . Number of children: Not on file  . Years of education: Not on file  . Highest education level: Not on file  Occupational History  . Not on file  Tobacco Use  . Smoking status: Current Every Day Smoker    Packs/day: 0.50    Years: 10.00    Pack years: 5.00    Types: Cigarettes  . Smokeless tobacco: Never  Used  Substance and Sexual Activity  . Alcohol use: Yes    Alcohol/week: 2.0 standard drinks    Types: 2 Standard drinks or equivalent per week  . Drug use: Never  . Sexual activity: Not Currently  Other Topics Concern  . Not on file  Social History Narrative  . Not on file   Social Determinants of Health   Financial Resource Strain:   . Difficulty of Paying Living Expenses:   Food Insecurity:   . Worried About Programme researcher, broadcasting/film/video in the Last Year:   . Barista in the Last Year:   Transportation Needs:   . Freight forwarder (Medical):   Marland Kitchen Lack of Transportation (Non-Medical):   Physical Activity:   . Days of Exercise per Week:   . Minutes of Exercise per Session:   Stress:   . Feeling of Stress :   Social Connections:   . Frequency of Communication with Friends and Family:   . Frequency of Social Gatherings with Friends and Family:   . Attends Religious Services:   . Active Member of Clubs or Organizations:   . Attends Banker Meetings:   Marland Kitchen Marital Status:   Intimate Partner Violence:   . Fear of Current or Ex-Partner:   . Emotionally Abused:   Marland Kitchen Physically Abused:   . Sexually Abused:  Outpatient Medications Prior to Visit  Medication Sig Dispense Refill  . ABILIFY 10 MG tablet Take 5 mg by mouth daily.     Marland Kitchen aspirin 325 MG tablet Take 325 mg by mouth daily.    . B Complex-C-Folic Acid (STRESS B COMPLEX PO) Take 1 tablet by mouth daily.    . clonazePAM (KLONOPIN) 1 MG tablet Take 1 mg by mouth 2 (two) times daily.     Marland Kitchen donepezil (ARICEPT) 10 MG tablet 1 daily (Patient taking differently: Take 10 mg by mouth at bedtime. 1 daily) 100 tablet 3  . lamoTRIgine (LAMICTAL) 200 MG tablet Take 200 mg by mouth daily.     . Multiple Vitamin (MULTIVITAMIN) capsule Take 1 capsule by mouth daily.    . QUEtiapine (SEROQUEL) 300 MG tablet Take 300 mg by mouth at bedtime.     . risperiDONE (RISPERDAL) 3 MG tablet Take 3 mg by mouth daily.     .  simvastatin (ZOCOR) 10 MG tablet TAKE 1 TABLET BY MOUTH EVERYDAY AT BEDTIME (Patient taking differently: Take 10 mg by mouth at bedtime. TAKE 1 TABLET BY MOUTH EVERYDAY AT BEDTIME) 90 tablet 0  . irbesartan (AVAPRO) 150 MG tablet TAKE 1 TABLET (150 MG TOTAL) BY MOUTH DAILY. **PATIENT NEEDS APT FOR FURTHER REFILLS** 15 tablet 0   No facility-administered medications prior to visit.    No Known Allergies  ROS Review of Systems General: Denies fever, chills, unexplained weight loss.  Optho/Auditory: Denies visual changes, blurred vision/LOV Respiratory:  Denies SOB, DOE more than baseline levels.  Cardiovascular:Denies chest pain, palpitations, new onset peripheral edema  Gastrointestinal: Denies nausea, vomiting, diarrhea.  Endocrine: Denies hot or cold intolerance, polyuria, polydipsia. Musculoskeletal: Denies unexplained myalgias, joint swelling, unexplained arthralgias. Skin: Denies rash Neurological: Denies dizziness, unexplained weakness, numbness  Psychiatric/Behavioral:   Denies mood changes, suicidal or homicidal ideations, hallucinations  Objective:    Physical Exam  General: Answers questions appropriately and in no acute distress.  Neuro:  Alert and oriented,  extra-ocular muscles intact  HEENT:  Normocephalic, atraumatic, neck supple Skin:  no gross rash, warm, pink. Cardiac:  Irregular rhythm with tachycardia. No murmur noted. Respiratory:  ECTA B/L and A/P, Not using accessory muscles, speaking in full sentences- unlabored. Vascular:  Ext warm, no cyanosis apprec.; cap RF less 2 sec. Psych:  No HI/SI, judgement and insight good, Euthymic mood.    BP (!) 156/88   Pulse (!) 150   Temp 98.9 F (37.2 C) (Oral)   Ht 5\' 9"  (1.753 m)   Wt 186 lb 3.2 oz (84.5 kg)   SpO2 94% Comment: on RA  BMI 27.50 kg/m  Wt Readings from Last 3 Encounters:  04/02/20 186 lb 4.6 oz (84.5 kg)  04/02/20 186 lb 3.2 oz (84.5 kg)  01/09/20 190 lb (86.2 kg)     Health Maintenance Due   Topic Date Due  . Hepatitis C Screening  Never done  . COLONOSCOPY  06/20/2017  . TETANUS/TDAP  06/12/2019    There are no preventive care reminders to display for this patient.  Lab Results  Component Value Date   TSH 0.882 04/02/2020   Lab Results  Component Value Date   WBC 5.0 04/02/2020   HGB 15.0 04/02/2020   HCT 44.7 04/02/2020   MCV 103.2 (H) 04/02/2020   PLT 195 04/02/2020   Lab Results  Component Value Date   NA 137 04/02/2020   K 4.2 04/02/2020   CO2 25 04/02/2020   GLUCOSE 114 (H) 04/02/2020  BUN <5 (L) 04/02/2020   CREATININE 0.98 04/02/2020   BILITOT 0.7 04/02/2020   ALKPHOS 120 04/02/2020   AST 43 (H) 04/02/2020   ALT 23 04/02/2020   PROT 7.1 04/02/2020   ALBUMIN 3.7 04/02/2020   CALCIUM 9.0 04/02/2020   ANIONGAP 11 04/02/2020   GFR 81.46 09/20/2018   Lab Results  Component Value Date   CHOL 141 05/09/2019   Lab Results  Component Value Date   HDL 97 05/09/2019   Lab Results  Component Value Date   LDLCALC 28 05/09/2019   Lab Results  Component Value Date   TRIG 79 05/09/2019   Lab Results  Component Value Date   CHOLHDL 1.5 05/09/2019   Lab Results  Component Value Date   HGBA1C 5.3 05/09/2019      Assessment & Plan:   Problem List Items Addressed This Visit      Cardiovascular and Mediastinum   HTN, goal below 140/90   Relevant Medications   irbesartan (AVAPRO) 150 MG tablet     Nervous and Auditory   Weakness of right leg (Chronic)    Other Visit Diagnoses    Elevated heart rate with elevated blood pressure and diagnosis of hypertension    -  Primary   Relevant Medications   irbesartan (AVAPRO) 150 MG tablet   Other Relevant Orders   EKG 12-Lead     Elevated heart rate with elevated blood pressure: - Patient is asymptomatic and pulse elevated at 150 bpm. It was rechecked twice manually and in the same range.  - Ordered EKG which revealed PVCs, rate of 139, and in comparison with EKG in 2019 these are new  findings. - Consulted with his PCP and we both agreed to send patient to ED via EMS for further evaluation and management. - Patient initially declined transportation via EMS but his wife spoke to him and convinced him.  HTN: - BP today is 156/88, not at goal of <140/90 and has been out of medication x 1 wk, so encouraged patient to continue checking BP at home and keep a log to monitor for elevated readings.  - Recommend to check pulse too and record along with BP. - Will not make any medication changes at this time. - Sent additional refills of Avapro 150 mg.      Weakness of right leg - Continue home PT to improve quadriceps strengthening.   Per last OV, PCP mentions to return for follow-up on memory concerns. Mini-mental Status Exam performed and score was 26/30. No concerns at this time. Patient's elevated heart rate took priority and any memory concerns should be addressed at next OV.  Meds ordered this encounter  Medications  . irbesartan (AVAPRO) 150 MG tablet    Sig: Take 1 tablet (150 mg total) by mouth daily.    Dispense:  90 tablet    Refill:  1    Follow-up: Return for HTN, HDL with PCP in 3-4 months or sooner if needed.    Lorrene Reid, PA-C

## 2020-04-20 ENCOUNTER — Other Ambulatory Visit: Payer: Self-pay | Admitting: Family Medicine

## 2020-04-20 DIAGNOSIS — E7800 Pure hypercholesterolemia, unspecified: Secondary | ICD-10-CM

## 2020-04-29 ENCOUNTER — Telehealth: Payer: Self-pay | Admitting: Physician Assistant

## 2020-04-29 NOTE — Telephone Encounter (Signed)
Patient is requesting a refill of his irbesartan, if approved please send to [facility] on [address]

## 2020-04-29 NOTE — Telephone Encounter (Signed)
Last refill sent 04/02/20 by Kandis Cocking for #90 day with 1 refill. Patient aware of this and advised to call pharmacy. Patient verbalized understanding. AS, CMA  irbesartan (AVAPRO) 150 MG tablet [[id number]]    Order Details Dose: 150 mg Route: Oral Frequency: Daily  Dispense Quantity: 90 tablet Refills: 1        Sig: Take 1 tablet (150 mg total) by mouth daily.       Start Date: 04/02/20 End Date: --  Written Date: 04/02/20 Expiration Date: 04/02/21     Diagnosis Association: HTN, goal below 140/90 (I10)  Original Order:  irbesartan (AVAPRO) 150 MG tablet [[id number]]  Providers  Authorizing Provider: Peggye Fothergill NPI: [id number]   DEA #: [id number]  Ordering User:  Mayer Masker, PA-C      Pharmacy  [facility] [phone] [place], [place] - [address]  [address], [place] [address]  Phone:  [phone]  Fax:  [phone]  DEA #:  [id number]

## 2020-05-23 ENCOUNTER — Encounter: Payer: Self-pay | Admitting: Physician Assistant

## 2020-05-29 DIAGNOSIS — F3176 Bipolar disorder, in full remission, most recent episode depressed: Secondary | ICD-10-CM | POA: Diagnosis not present

## 2020-05-29 DIAGNOSIS — F411 Generalized anxiety disorder: Secondary | ICD-10-CM | POA: Diagnosis not present

## 2020-07-28 ENCOUNTER — Other Ambulatory Visit: Payer: Self-pay | Admitting: Physician Assistant

## 2020-07-28 DIAGNOSIS — E7800 Pure hypercholesterolemia, unspecified: Secondary | ICD-10-CM

## 2020-09-19 DIAGNOSIS — Z23 Encounter for immunization: Secondary | ICD-10-CM | POA: Diagnosis not present

## 2020-10-16 ENCOUNTER — Other Ambulatory Visit: Payer: Self-pay | Admitting: Physician Assistant

## 2020-10-16 ENCOUNTER — Telehealth: Payer: Self-pay | Admitting: Physician Assistant

## 2020-10-16 DIAGNOSIS — E7800 Pure hypercholesterolemia, unspecified: Secondary | ICD-10-CM

## 2020-10-16 NOTE — Telephone Encounter (Signed)
Please call patient to schedule apt for further med refills.  ° °AS,CMA °

## 2020-10-22 ENCOUNTER — Other Ambulatory Visit: Payer: Self-pay | Admitting: Physician Assistant

## 2020-10-22 DIAGNOSIS — I10 Essential (primary) hypertension: Secondary | ICD-10-CM

## 2020-11-08 ENCOUNTER — Other Ambulatory Visit: Payer: Self-pay | Admitting: Physician Assistant

## 2020-11-08 DIAGNOSIS — E7800 Pure hypercholesterolemia, unspecified: Secondary | ICD-10-CM

## 2020-11-13 ENCOUNTER — Ambulatory Visit (INDEPENDENT_AMBULATORY_CARE_PROVIDER_SITE_OTHER): Payer: Medicare Other | Admitting: Physician Assistant

## 2020-11-13 ENCOUNTER — Encounter: Payer: Self-pay | Admitting: Physician Assistant

## 2020-11-13 ENCOUNTER — Other Ambulatory Visit: Payer: Self-pay

## 2020-11-13 VITALS — BP 144/80 | HR 123 | Ht 69.0 in | Wt 185.3 lb

## 2020-11-13 DIAGNOSIS — R000 Tachycardia, unspecified: Secondary | ICD-10-CM

## 2020-11-13 DIAGNOSIS — I10 Essential (primary) hypertension: Secondary | ICD-10-CM

## 2020-11-13 DIAGNOSIS — M545 Low back pain, unspecified: Secondary | ICD-10-CM | POA: Diagnosis not present

## 2020-11-13 MED ORDER — IBUPROFEN 600 MG PO TABS: 600.0000 mg | ORAL_TABLET | Freq: Three times a day (TID) | ORAL | 0 refills | Status: DC | PRN

## 2020-11-13 NOTE — Progress Notes (Signed)
Acute Office Visit  Subjective:    Patient ID: Cody Perez, male    DOB: 03-11-1951, 69 y.o.   MRN: [id number]  Chief Complaint  Patient presents with  . Back Pain    HPI Patient is in today for left lower back pain x 1 week. Pain radiates towards midline. Reports pain is worse in the morning then gets better throughout the day and worsens again in the evening. Nothing makes it worse. Has applied heat which has helped some.  Denies any numbness or tingling sensation.   Hypertension: Patient's BP and pulse is elevated today. Denies chest pain, palpitations, dyspnea or edema. States did see a cardiologist a long time ago and was told he had mitral valve prolapse.  Patient does have blood pressure device at home but does not check blood pressure or pulse.  Past Medical History:  Diagnosis Date  . Hyperlipidemia   . Hypertension     History reviewed. No pertinent surgical history.  Family History  Adopted: Yes    Social History   Socioeconomic History  . Marital status: Married    Spouse name: Not on file  . Number of children: Not on file  . Years of education: Not on file  . Highest education level: Not on file  Occupational History  . Not on file  Tobacco Use  . Smoking status: Current Every Day Smoker    Packs/day: 0.50    Years: 10.00    Pack years: 5.00    Types: Cigarettes  . Smokeless tobacco: Never Used  Vaping Use  . Vaping Use: Never used  Substance and Sexual Activity  . Alcohol use: Yes    Alcohol/week: 2.0 standard drinks    Types: 2 Standard drinks or equivalent per week  . Drug use: Never  . Sexual activity: Not Currently  Other Topics Concern  . Not on file  Social History Narrative  . Not on file   Social Determinants of Health   Financial Resource Strain:   . Difficulty of Paying Living Expenses: Not on file  Food Insecurity:   . Worried About Programme researcher, broadcasting/film/video in the Last Year: Not on file  . Ran Out of Food in the Last Year: Not on  file  Transportation Needs:   . Lack of Transportation (Medical): Not on file  . Lack of Transportation (Non-Medical): Not on file  Physical Activity:   . Days of Exercise per Week: Not on file  . Minutes of Exercise per Session: Not on file  Stress:   . Feeling of Stress : Not on file  Social Connections:   . Frequency of Communication with Friends and Family: Not on file  . Frequency of Social Gatherings with Friends and Family: Not on file  . Attends Religious Services: Not on file  . Active Member of Clubs or Organizations: Not on file  . Attends Banker Meetings: Not on file  . Marital Status: Not on file  Intimate Partner Violence:   . Fear of Current or Ex-Partner: Not on file  . Emotionally Abused: Not on file  . Physically Abused: Not on file  . Sexually Abused: Not on file    Outpatient Medications Prior to Visit  Medication Sig Dispense Refill  . ABILIFY 10 MG tablet Take 5 mg by mouth daily.     Marland Kitchen aspirin 325 MG tablet Take 325 mg by mouth daily.    . B Complex-C-Folic Acid (STRESS B COMPLEX PO) Take 1  tablet by mouth daily.    . clonazePAM (KLONOPIN) 1 MG tablet Take 1 mg by mouth 2 (two) times daily.     Marland Kitchen donepezil (ARICEPT) 10 MG tablet 1 daily (Patient taking differently: Take 10 mg by mouth at bedtime. 1 daily) 100 tablet 3  . irbesartan (AVAPRO) 150 MG tablet TAKE 1 TABLET BY MOUTH EVERY DAY 60 tablet 0  . lamoTRIgine (LAMICTAL) 200 MG tablet Take 200 mg by mouth daily.     . Multiple Vitamin (MULTIVITAMIN) capsule Take 1 capsule by mouth daily.    . QUEtiapine (SEROQUEL) 300 MG tablet Take 300 mg by mouth at bedtime.     . risperiDONE (RISPERDAL) 3 MG tablet Take 3 mg by mouth daily.     . simvastatin (ZOCOR) 10 MG tablet TAKE 1 TABLET BY MOUTH EVERYDAY AT BEDTIME**PATIENT NEEDS APT FOR FURTHER REFILLS** 30 tablet 0  . irbesartan (AVAPRO) 150 MG tablet Take 1 tablet (150 mg total) by mouth daily. 30 tablet 0   No facility-administered medications  prior to visit.    No Known Allergies  Review of Systems A fourteen system review of systems was performed and found to be positive as per HPI.    Objective:    Physical Exam General:  Well Developed, well nourished, appropriate for stated age.  Neuro:  Alert and oriented,  extra-ocular muscles intact  HEENT:  Normocephalic, atraumatic, neck supple Skin:  no gross rash, warm, pink. Cardiac:  Regular rhythm with tachycardia Respiratory:  ECTA B/L, Not using accessory muscles, speaking in full sentences. Some dyspnea with exertion noted. MSK: No TTP of paraspinal muscles, c-spine/t-spine/l-spine or midline. Good ROM. Negative straight leg raise b/l. Slight uneven shoulders noted.  Vascular:  Ext warm, no cyanosis apprec.; no edema  Psych:  No HI/SI, judgement and insight good, Euthymic mood. Full Affect.  BP (!) 144/80   Pulse (!) 123   Ht 5\' 9"  (1.753 m)   Wt 185 lb 4.8 oz (84.1 kg)   SpO2 97%   BMI 27.36 kg/m  Wt Readings from Last 3 Encounters:  11/13/20 185 lb 4.8 oz (84.1 kg)  04/02/20 186 lb 4.6 oz (84.5 kg)  04/02/20 186 lb 3.2 oz (84.5 kg)    Health Maintenance Due  Topic Date Due  . Hepatitis C Screening  Never done  . COLONOSCOPY  06/20/2017  . TETANUS/TDAP  06/12/2019  . INFLUENZA VACCINE  07/14/2020    There are no preventive care reminders to display for this patient.   Lab Results  Component Value Date   TSH 0.882 04/02/2020   Lab Results  Component Value Date   WBC 5.0 04/02/2020   HGB 15.0 04/02/2020   HCT 44.7 04/02/2020   MCV 103.2 (H) 04/02/2020   PLT 195 04/02/2020   Lab Results  Component Value Date   NA 137 04/02/2020   K 4.2 04/02/2020   CO2 25 04/02/2020   GLUCOSE 114 (H) 04/02/2020   BUN <5 (L) 04/02/2020   CREATININE 0.98 04/02/2020   BILITOT 0.7 04/02/2020   ALKPHOS 120 04/02/2020   AST 43 (H) 04/02/2020   ALT 23 04/02/2020   PROT 7.1 04/02/2020   ALBUMIN 3.7 04/02/2020   CALCIUM 9.0 04/02/2020   ANIONGAP 11 04/02/2020    GFR 81.46 09/20/2018   Lab Results  Component Value Date   CHOL 141 05/09/2019   Lab Results  Component Value Date   HDL 97 05/09/2019   Lab Results  Component Value Date   LDLCALC 28  05/09/2019   Lab Results  Component Value Date   TRIG 79 05/09/2019   Lab Results  Component Value Date   CHOLHDL 1.5 05/09/2019   Lab Results  Component Value Date   HGBA1C 5.3 05/09/2019       Assessment & Plan:   Problem List Items Addressed This Visit      Cardiovascular and Mediastinum   HTN, goal below 140/90   Relevant Orders   Ambulatory referral to Cardiology    Other Visit Diagnoses    Acute left-sided low back pain without sciatica    -  Primary   Relevant Medications   ibuprofen (ADVIL) 600 MG tablet   Tachycardia       Relevant Orders   Ambulatory referral to Cardiology     Acute left-sided low back pain without sciatica: -Advised patient to hold aspirin and will try ibuprofen 600 mg 3 times daily as needed for pain relief for 3 days. (Last CMP- renal function wnl). Due to elevated blood pressure will limit NSAID use. -Continue to apply heat. -Reviewed Lumbar XR 01/09/2020 which revealed advanced DDD. -If symptoms fail to improve or worsen recommend to see Dr. Lajoyce Corners again. Advised to let me know and will place new referral.  Hypertension, Tachycardia: -Patient's BP elevated at 156/78 with pulse 148, BP recheck mildly improved to 144/80. -At last OV patient's heart rate was elevated (150 bpm) and patient was sent to ED for evaluation and work-up with EKG and labs were essentially wnl. EKG revelead PVCs and sinus tachycardia. -Lumbar XR from 01/09/2020 also revealed calcification of the aorta. -Discussed with patient referral to cardiology for further work-up and he is agreeable. Patient will likely benefit from medication adjustments such as a beta blocker to help improve pulse and BP. -Advised to start monitoring BP and pulse at home and keep a log.   Meds  ordered this encounter  Medications  . ibuprofen (ADVIL) 600 MG tablet    Sig: Take 1 tablet (600 mg total) by mouth every 8 (eight) hours as needed for moderate pain.    Dispense:  10 tablet    Refill:  0    Order Specific Question:   Supervising Provider    Answer:   Nani Gasser D [[id number]]     Mayer Masker, PA-C

## 2020-11-13 NOTE — Patient Instructions (Addendum)
HOLD aspirin when taking Ibuprofen.  Check BP and pulse at home, and keep a log.   Acute Back Pain, Adult Acute back pain is sudden and usually short-lived. It is often caused by an injury to the muscles and tissues in the back. The injury may result from:  A muscle or ligament getting overstretched or torn (strained). Ligaments are tissues that connect bones to each other. Lifting something improperly can cause a back strain.  Wear and tear (degeneration) of the spinal disks. Spinal disks are circular tissue that provides cushioning between the bones of the spine (vertebrae).  Twisting motions, such as while playing sports or doing yard work.  A hit to the back.  Arthritis. You may have a physical exam, lab tests, and imaging tests to find the cause of your pain. Acute back pain usually goes away with rest and home care. Follow these instructions at home: Managing pain, stiffness, and swelling  Take over-the-counter and prescription medicines only as told by your health care provider.  Your health care provider may recommend applying ice during the first 24-48 hours after your pain starts. To do this: ? Put ice in a plastic bag. ? Place a towel between your skin and the bag. ? Leave the ice on for 20 minutes, 2-3 times a day.  If directed, apply heat to the affected area as often as told by your health care provider. Use the heat source that your health care provider recommends, such as a moist heat pack or a heating pad. ? Place a towel between your skin and the heat source. ? Leave the heat on for 20-30 minutes. ? Remove the heat if your skin turns bright red. This is especially important if you are unable to feel pain, heat, or cold. You have a greater risk of getting burned. Activity   Do not stay in bed. Staying in bed for more than 1-2 days can delay your recovery.  Sit up and stand up straight. Avoid leaning forward when you sit, or hunching over when you stand. ? If you  work at a desk, sit close to it so you do not need to lean over. Keep your chin tucked in. Keep your neck drawn back, and keep your elbows bent at a right angle. Your arms should look like the letter "L." ? Sit high and close to the steering wheel when you drive. Add lower back (lumbar) support to your car seat, if needed.  Take short walks on even surfaces as soon as you are able. Try to increase the length of time you walk each day.  Do not sit, drive, or stand in one place for more than 30 minutes at a time. Sitting or standing for long periods of time can put stress on your back.  Do not drive or use heavy machinery while taking prescription pain medicine.  Use proper lifting techniques. When you bend and lift, use positions that put less stress on your back: ? [place] your knees. ? Keep the load close to your body. ? Avoid twisting.  Exercise regularly as told by your health care provider. Exercising helps your back heal faster and helps prevent back injuries by keeping muscles strong and flexible.  Work with a physical therapist to make a safe exercise program, as recommended by your health care provider. Do any exercises as told by your physical therapist. Lifestyle  Maintain a healthy weight. Extra weight puts stress on your back and makes it difficult to have good  posture.  Avoid activities or situations that make you feel anxious or stressed. Stress and anxiety increase muscle tension and can make back pain worse. Learn ways to manage anxiety and stress, such as through exercise. General instructions  Sleep on a firm mattress in a comfortable position. Try lying on your side with your knees slightly bent. If you lie on your back, put a pillow under your knees.  Follow your treatment plan as told by your health care provider. This may include: ? Cognitive or behavioral therapy. ? Acupuncture or massage therapy. ? Meditation or yoga. Contact a health care provider if:  You have  pain that is not relieved with rest or medicine.  You have increasing pain going down into your legs or buttocks.  Your pain does not improve after 2 weeks.  You have pain at night.  You lose weight without trying.  You have a fever or chills. Get help right away if:  You develop new bowel or bladder control problems.  You have unusual weakness or numbness in your arms or legs.  You develop nausea or vomiting.  You develop abdominal pain.  You feel faint. Summary  Acute back pain is sudden and usually short-lived.  Use proper lifting techniques. When you bend and lift, use positions that put less stress on your back.  Take over-the-counter and prescription medicines and apply heat or ice as directed by your health care provider. This information is not intended to replace advice given to you by your health care provider. Make sure you discuss any questions you have with your health care provider. Document Revised: 03/21/2019 Document Reviewed: 07/14/2017 Elsevier Patient Education  2020 [employer].

## 2020-11-19 ENCOUNTER — Other Ambulatory Visit: Payer: Self-pay | Admitting: Physician Assistant

## 2020-11-19 DIAGNOSIS — I10 Essential (primary) hypertension: Secondary | ICD-10-CM

## 2020-11-21 ENCOUNTER — Ambulatory Visit (INDEPENDENT_AMBULATORY_CARE_PROVIDER_SITE_OTHER): Payer: Medicare Other | Admitting: Physician Assistant

## 2020-11-21 ENCOUNTER — Ambulatory Visit (INDEPENDENT_AMBULATORY_CARE_PROVIDER_SITE_OTHER): Payer: Medicare Other

## 2020-11-21 ENCOUNTER — Encounter: Payer: Self-pay | Admitting: Orthopedic Surgery

## 2020-11-21 VITALS — Ht 69.0 in | Wt 185.0 lb

## 2020-11-21 DIAGNOSIS — M5440 Lumbago with sciatica, unspecified side: Secondary | ICD-10-CM

## 2020-11-21 MED ORDER — PREDNISONE 10 MG PO TABS: 10.0000 mg | ORAL_TABLET | Freq: Every day | ORAL | 0 refills | Status: DC

## 2020-11-21 NOTE — Progress Notes (Signed)
Office Visit Note   Patient: Cody Perez           Date of Birth: 1951/01/09           MRN: [id number] Visit Date: 11/21/2020              Requested by: Mayer Masker, PA-C [address] [place],  [place] [address] PCP: Mayer Masker, PA-C  Chief Complaint  Patient presents with  . Lower Back - Pain      HPI: Is a pleasant 69 year old gentleman with a chief complaint of left lower back pain.  He was seen in January with concerns for lumbar and cervical issues by Dr. Lajoyce Corners.  At that time x-rays showed degenerative changes of both the cervical and lumbar spine.  He denies any history he just states that he was sitting on a plane for long period of time in an awkward position.  Since that time he has noticed increased pain on his low back.  He denies any fever chills or weakness.  Denies any paresthesias or radiation of pain  Assessment & Plan: Visit Diagnoses:  1. Low back pain with sciatica, sciatica laterality unspecified, unspecified back pain laterality, unspecified chronicity     Plan: Lower back pain.  He does have some advanced changes in his lumbar spine but these have not changed significantly since January by x-ray.  We will place him on a course of prednisone follow-up in 3 weeks.  If he has no improvement I would recommend an MRI have counseled him not to take ibuprofen with the prednisone  Follow-Up Instructions: No follow-ups on file.   Ortho Exam  Patient is alert, oriented, no adenopathy, well-dressed, normal affect, normal respiratory effort. He has focused lower back pain on the left side.  Some radiation down into his buttocks but none down into his lower leg.  Good plantar flexion dorsiflexion strength.  Has an antalgic gait but this is been present for quite a while and has not changed  Imaging: No results found. No images are attached to the encounter.  Labs: Lab Results  Component Value Date   HGBA1C 5.3 05/09/2019     Lab Results   Component Value Date   ALBUMIN 3.7 04/02/2020   ALBUMIN 4.2 05/09/2019   ALBUMIN 4.1 09/20/2018    No results found for: MG Lab Results  Component Value Date   VD25OH 34.7 05/09/2019    No results found for: PREALBUMIN CBC EXTENDED Latest Ref Rng & Units 04/02/2020 05/09/2019 09/20/2018  WBC 4.0 - 10.5 K/uL 5.0 5.9 5.5  RBC 4.22 - 5.81 MIL/uL 4.33 3.96(L) 3.92(L)  HGB 13.0 - 17.0 g/dL 41.6 60.6 30.1  HCT 60.1 - 52.0 % 44.7 38.9 40.0  PLT 150 - 400 K/uL 195 195 272.0  NEUTROABS 1.4 - 7.0 x10E3/uL - 3.2 3.3  LYMPHSABS 0.7 - 3.1 x10E3/uL - 2.1 1.8     Body mass index is 27.32 kg/m.  Orders:  Orders Placed This Encounter  Procedures  . XR Lumbar Spine 2-3 Views   No orders of the defined types were placed in this encounter.    Procedures: No procedures performed  Clinical Data: No additional findings.  ROS:  All other systems negative, except as noted in the HPI. Review of Systems  Objective: Vital Signs: Ht 5\' 9"  (1.753 m)   Wt 185 lb (83.9 kg)   BMI 27.32 kg/m   Specialty Comments:  No specialty comments available.  PMFS History: Patient Active  Problem List   Diagnosis Date Noted  . 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 Weakness of right leg 11/28/2019  . Unsteady gait when walking- due to Quad muscle rupture and repair 2015 11/28/2019  . Vitamin D insufficiency 05/11/2019  . Upper airway cough syndrome 11/08/2018  . Current smoker-   1/2 ppd for 4 yrs or so 11/03/2018  . HTN, goal below 140/90 04/15/2016  . Onychomycosis of toenail 11/30/2013  . MITRAL REGURGITATION- with heart murmur 05/18/2008  . 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 Hyperlipidemia 03/03/2008  . DEMENTIA 03/03/2008  . ANXIETY 03/03/2008  . 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 PERIPHERAL NEUROPATHY 03/03/2008  . COPD GOLD II active smoker  03/03/2008   Past Medical History:  Diagnosis Date  . Hyperlipidemia   . Hypertension     Family History  Adopted: Yes    No past surgical history on file. Social History   Occupational History  . Not on file  Tobacco Use  . Smoking status: Current Every Day Smoker    Packs/day: 0.50    Years: 10.00    Pack years: 5.00    Types: Cigarettes  . Smokeless tobacco: Never Used  Vaping Use  . Vaping Use: Never used  Substance and Sexual Activity  . Alcohol use: Yes    Alcohol/week: 2.0 standard drinks    Types: 2 Standard drinks or equivalent per week  . Drug use: Never  . Sexual activity: Not Currently

## 2020-11-27 DIAGNOSIS — F3176 Bipolar disorder, in full remission, most recent episode depressed: Secondary | ICD-10-CM | POA: Diagnosis not present

## 2020-11-28 DIAGNOSIS — Z23 Encounter for immunization: Secondary | ICD-10-CM | POA: Diagnosis not present

## 2020-12-05 ENCOUNTER — Other Ambulatory Visit: Payer: Self-pay | Admitting: Physician Assistant

## 2020-12-05 DIAGNOSIS — E7800 Pure hypercholesterolemia, unspecified: Secondary | ICD-10-CM

## 2020-12-12 ENCOUNTER — Encounter: Payer: Self-pay | Admitting: Orthopedic Surgery

## 2020-12-12 ENCOUNTER — Ambulatory Visit (INDEPENDENT_AMBULATORY_CARE_PROVIDER_SITE_OTHER): Payer: Medicare Other | Admitting: Orthopedic Surgery

## 2020-12-12 VITALS — Ht 69.0 in | Wt 185.0 lb

## 2020-12-12 DIAGNOSIS — M545 Low back pain, unspecified: Secondary | ICD-10-CM

## 2020-12-12 NOTE — Progress Notes (Signed)
Office Visit Note   Patient: Cody Perez           Date of Birth: 1951/07/13           MRN: [id number] Visit Date: 12/12/2020              Requested by: Mayer Masker, PA-C [address] [place],  [place] [address] PCP: Mayer Masker, PA-C  Chief Complaint  Patient presents with  . Lower Back - Follow-up      HPI: Patient is a 69 year old gentleman who presents with chronic lower back pain.  Patient took prednisone 10 mg with minimal relief he states he does not have any radicular symptoms at this time states that it is sometimes on the left and sometimes on the right paraspinous muscles of the lumbar bar spine.  Assessment & Plan: Visit Diagnoses:  1. Low back pain, unspecified back pain laterality, unspecified chronicity, unspecified whether sciatica present     Plan: We will set patient up with physical therapy and if patient is not showing improvement with therapy would request an MRI scan of his lumbar spine.  Recommended heat and nonsteroidals as needed.  Follow-Up Instructions: Return in about 4 weeks (around 01/09/2021).   Ortho Exam  Patient is alert, oriented, no adenopathy, well-dressed, normal affect, normal respiratory effort. Patient has a wide-based gait feet externally rotated no antalgic gait no dropfoot.  Patient has a negative straight leg raise bilaterally with no focal motor weakness in either lower extremity.  Imaging: No results found. No images are attached to the encounter.  Labs: Lab Results  Component Value Date   HGBA1C 5.3 05/09/2019     Lab Results  Component Value Date   ALBUMIN 3.7 04/02/2020   ALBUMIN 4.2 05/09/2019   ALBUMIN 4.1 09/20/2018    No results found for: MG Lab Results  Component Value Date   VD25OH 34.7 05/09/2019    No results found for: PREALBUMIN CBC EXTENDED Latest Ref Rng & Units 04/02/2020 05/09/2019 09/20/2018  WBC 4.0 - 10.5 K/uL 5.0 5.9 5.5  RBC 4.22 - 5.81 MIL/uL 4.33 3.96(L) 3.92(L)   HGB 13.0 - 17.0 g/dL 16.6 06.3 01.6  HCT 01.0 - 52.0 % 44.7 38.9 40.0  PLT 150 - 400 K/uL 195 195 272.0  NEUTROABS 1.4 - 7.0 x10E3/uL - 3.2 3.3  LYMPHSABS 0.7 - 3.1 x10E3/uL - 2.1 1.8     Body mass index is 27.32 kg/m.  Orders:  No orders of the defined types were placed in this encounter.  No orders of the defined types were placed in this encounter.    Procedures: No procedures performed  Clinical Data: No additional findings.  ROS:  All other systems negative, except as noted in the HPI. Review of Systems  Objective: Vital Signs: Ht 5\' 9"  (1.753 m)   Wt 185 lb (83.9 kg)   BMI 27.32 kg/m   Specialty Comments:  No specialty comments available.  PMFS History: Patient Active Problem List   Diagnosis Date Noted  . 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 Weakness of right leg 11/28/2019  . Unsteady gait when walking- due to Quad muscle rupture and repair 2015 11/28/2019  . Vitamin D insufficiency 05/11/2019  . Upper airway cough syndrome 11/08/2018  . Current smoker-   1/2 ppd for 4 yrs or so 11/03/2018  . HTN, goal below 140/90 04/15/2016  . Onychomycosis of toenail 11/30/2013  . MITRAL REGURGITATION- with  heart murmur 05/18/2008  . 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 Hyperlipidemia 03/03/2008  . DEMENTIA 03/03/2008  . ANXIETY 03/03/2008  . 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 PERIPHERAL NEUROPATHY 03/03/2008  . COPD GOLD II active smoker 03/03/2008   Past Medical History:  Diagnosis Date  . Hyperlipidemia   . Hypertension     Family History  Adopted: Yes    History reviewed. No pertinent surgical history. Social History   Occupational History  . Not on file  Tobacco Use  . Smoking status: Current Every Day Smoker    Packs/day: 0.50    Years: 10.00    Pack years: 5.00    Types: Cigarettes  . Smokeless  tobacco: Never Used  Vaping Use  . Vaping Use: Never used  Substance and Sexual Activity  . Alcohol use: Yes    Alcohol/week: 2.0 standard drinks    Types: 2 Standard drinks or equivalent per week  . Drug use: Never  . Sexual activity: Not Currently

## 2020-12-18 ENCOUNTER — Other Ambulatory Visit: Payer: Self-pay | Admitting: Physician Assistant

## 2020-12-26 ENCOUNTER — Ambulatory Visit (INDEPENDENT_AMBULATORY_CARE_PROVIDER_SITE_OTHER): Payer: Medicare Other | Admitting: Rehabilitative and Restorative Service Providers"

## 2020-12-26 ENCOUNTER — Other Ambulatory Visit: Payer: Self-pay

## 2020-12-26 ENCOUNTER — Encounter: Payer: Self-pay | Admitting: Rehabilitative and Restorative Service Providers"

## 2020-12-26 DIAGNOSIS — R293 Abnormal posture: Secondary | ICD-10-CM | POA: Diagnosis not present

## 2020-12-26 DIAGNOSIS — R262 Difficulty in walking, not elsewhere classified: Secondary | ICD-10-CM

## 2020-12-26 DIAGNOSIS — G8929 Other chronic pain: Secondary | ICD-10-CM | POA: Diagnosis not present

## 2020-12-26 DIAGNOSIS — M6281 Muscle weakness (generalized): Secondary | ICD-10-CM | POA: Diagnosis not present

## 2020-12-26 DIAGNOSIS — M545 Low back pain, unspecified: Secondary | ICD-10-CM

## 2020-12-26 NOTE — Patient Instructions (Signed)
Access Code: [email or web address] URL: [email or web address] Date: 12/26/2020 Prepared by: Pauletta Browns  Exercises Standing Lumbar Extension at Wall - Forearms - 5-15 x daily - 7 x weekly - 1 sets - 3-5 reps - 3 seconds hold Standing Scapular Retraction - 5-15 x daily - 7 x weekly - 1 sets - 3-5 reps - 5 second hold

## 2020-12-26 NOTE — Therapy (Signed)
[facility] [address] [place], [place], [address] Phone: [phone]   Fax:  [phone]  Physical Therapy Evaluation  Patient Details  Name: Cody Perez MRN: [id number] Date of Birth: 08-19-1951 Referring Provider (PT): Nadara Mustard MD   Encounter Date: 12/26/2020   PT End of Session - 12/26/20 1642    Visit Number 1    Number of Visits 16    PT Start Time 0845    PT Stop Time 0925    PT Time Calculation (min) 40 min    Activity Tolerance Patient tolerated treatment well;No increased pain    Behavior During Therapy WFL for tasks assessed/performed           Past Medical History:  Diagnosis Date  . Hyperlipidemia   . Hypertension     History reviewed. No pertinent surgical history.  There were no vitals filed for this visit.    Subjective Assessment - 12/26/20 0925    Subjective Cody Perez has had low back pain for about 3 months.  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  He is a smoker.  He is not exercising.    Pertinent History R quadriceps rupture and repair 2015, smoker, COPD, dementia, peripheral neuropathy, HTN    Limitations Sitting;Writing;Reading;House hold activities;Lifting;Standing;Walking    How long can you sit comfortably? Sits most of the day to work    How long can you stand comfortably? Limited to a few minutes    How long can you walk comfortably? 100 feet    Patient Stated Goals Decrease low back pain    Currently in Pain? Yes    Pain Score 4     Pain Location Back    Pain Orientation Lower    Pain Descriptors / Indicators Aching;Sore;Discomfort;Tender    Pain Type Chronic pain    Pain Radiating Towards NA    Pain Onset More than a month ago    Pain Frequency Intermittent    Aggravating Factors  Prolonged postures.  Standing and walking worse.    Pain Relieving Factors Change of position.    Effect of Pain on Daily Activities Limits mobility and comfort with sitting as required as  work as a Magazine features editor.    Multiple Pain Sites No              [facility] PT Assessment - 12/26/20 0001      Assessment   Medical Diagnosis Low back pain    Referring Provider (PT) Nadara Mustard MD    Onset Date/Surgical Date --   ~ 3 months     Balance Screen   Has the patient fallen in the past 6 months No    Has the patient had a decrease in activity level because of a fear of falling?  No    Is the patient reluctant to leave their home because of a fear of falling?  No      Prior Function   Level of Independence Independent      Cognition   Overall Cognitive Status Within Functional Limits for tasks assessed      Observation/Other Assessments   Observations Flexed posture    Focus on Therapeutic Outcomes (FOTO)  43 (Goal 64)      Posture/Postural Control   Posture/Postural Control Postural limitations    Postural Limitations Forward head;Rounded Shoulders;Decreased lumbar lordosis      ROM / Strength   AROM / PROM / Strength AROM;Strength  AROM   Overall AROM  Deficits    AROM Assessment Site Lumbar    Lumbar Extension 5      Strength   Overall Strength Deficits    Strength Assessment Site Lumbar    Lumbar Extension --   Poor strength due to postural limitations.  Testing not appropriate due to test positions required likely to increase pain.                     Objective measurements completed on examination: See above findings.       [facility] Adult PT Treatment/Exercise - 12/26/20 0001      Therapeutic Activites    Therapeutic Activities ADL's    ADL's Posture, spine anatomy, basic body mechanics (log roll), importance of frequent changes of position, home walking and exercise program review      Exercises   Exercises Lumbar      Lumbar Exercises: Standing   Shoulder ADduction Strengthening;Both;10 reps;Other (comment)    Shoulder Adduction Limitations 5 seconds (shoulder blade pinches)    Other Standing Lumbar Exercises Trunk extension  AROM 10X 3 seconds                  PT Education - 12/26/20 1641    Education Details Discussed posture, body mechanics (log roll), anatomy, importance of taking frequent breaks to stand when sitting at home and work, reviewed home walking and exercise program.    Person(s) Educated Patient    Methods Explanation;Demonstration;Tactile cues;Verbal cues;Handout    Comprehension Verbal cues required;Returned demonstration;Need further instruction;Tactile cues required;Verbalized understanding               PT Long Term Goals - 12/26/20 1647      PT LONG TERM GOAL #1   Title Improve FOTO to 64.    Baseline 43    Time 8    Period Weeks    Status New    Target Date 03/13/21      PT LONG TERM GOAL #2   Title Improve trunk extension AROM to 10 degrees.    Baseline 0    Time 8    Period Weeks    Status New      PT LONG TERM GOAL #3   Title Cody Perez will report low back pain consistently 0-3/10 on the Numeric Pain Rating Scale.    Baseline Can be 5+    Time 8    Period Weeks    Status New    Target Date 03/13/21      PT LONG TERM GOAL #4   Title Cody Perez will be independent with his long-term HEP.    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  His posture is very flexed.  With postural education, awareness, core and upper back strengthening, Cody Perez should make excellent progress with his physical therapy.  General leg strengthening is also recommended to address R > L leg weakness (previous quadriceps rupture) and to allow him to use correct body mechanics and protect his spine.    Personal Factors and Comorbidities Comorbidity 1    Comorbidities R Quad rupture and repair 2015  Smoker  COPD  Dementia  Peripheral neuropathy  HTN  Examination-Activity Limitations Sit;Bed Mobility;Bend;Lift;Squat;Stairs;Stand     Examination-Participation Restrictions Occupation;Community Activity    Stability/Clinical Decision Making Stable/Uncomplicated    Clinical Decision Making Low    Rehab Potential Good    PT Frequency 2x / week    PT Duration 8 weeks    PT Treatment/Interventions ADLs/Self Care Home Management;Moist Heat;Cryotherapy;Therapeutic activities;Therapeutic exercise;Neuromuscular re-education;Patient/family education;Manual techniques;Dry needling    PT Next Visit Plan Postural, core, leg and scapular strengthening, posture and body mechanics    PT Home Exercise Plan Access Code: [email or web address]    Consulted and Agree with Plan of Care Patient           Patient will benefit from skilled therapeutic intervention in order to improve the following deficits and impairments:  Decreased endurance,Decreased range of motion,Decreased strength,Difficulty walking,Improper body mechanics,Postural dysfunction,Pain  Visit Diagnosis: Difficulty walking  Abnormal posture  Muscle weakness (generalized)  Chronic bilateral low back pain without sciatica     Problem List Patient Active Problem List   Diagnosis Date Noted  . 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 Weakness of right leg 11/28/2019  . Unsteady gait when walking- due to Quad muscle rupture and repair 2015 11/28/2019  . Vitamin D insufficiency 05/11/2019  . Upper airway cough syndrome 11/08/2018  . Current smoker-   1/2 ppd for 4 yrs or so 11/03/2018  . HTN, goal below 140/90 04/15/2016  . Onychomycosis of toenail 11/30/2013  . MITRAL REGURGITATION- with heart murmur 05/18/2008  . 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 Hyperlipidemia 03/03/2008  . DEMENTIA 03/03/2008  . ANXIETY 03/03/2008  . 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 PERIPHERAL NEUROPATHY 03/03/2008  . COPD GOLD II active smoker 03/03/2008     Cherlyn Cushing PT, MPT 12/26/2020, 4:50 PM  [facility] [address] [place], [place], [address] Phone: [phone]   Fax:  [phone]  Name: Cody Perez MRN: [id number] Date of Birth: August 09, 1951

## 2020-12-30 ENCOUNTER — Encounter: Payer: Medicare Other | Admitting: Physical Therapy

## 2020-12-31 ENCOUNTER — Encounter: Payer: Self-pay | Admitting: Physician Assistant

## 2020-12-31 ENCOUNTER — Ambulatory Visit: Payer: Medicare Other | Admitting: Physician Assistant

## 2020-12-31 ENCOUNTER — Ambulatory Visit (INDEPENDENT_AMBULATORY_CARE_PROVIDER_SITE_OTHER): Payer: Medicare Other | Admitting: Physician Assistant

## 2020-12-31 ENCOUNTER — Other Ambulatory Visit: Payer: Self-pay

## 2020-12-31 VITALS — BP 147/77 | HR 99 | Ht 69.0 in | Wt 180.0 lb

## 2020-12-31 DIAGNOSIS — E7800 Pure hypercholesterolemia, unspecified: Secondary | ICD-10-CM

## 2020-12-31 DIAGNOSIS — I10 Essential (primary) hypertension: Secondary | ICD-10-CM | POA: Diagnosis not present

## 2020-12-31 MED ORDER — SIMVASTATIN 10 MG PO TABS: 10.0000 mg | ORAL_TABLET | Freq: Every day | ORAL | 1 refills | Status: DC

## 2020-12-31 NOTE — Progress Notes (Signed)
Telehealth office visit note for Cody Masker, PA-C- at Primary Care at [facility]   I connected with current patient today by telephone and verified that I am speaking with the correct person   . Location of the patient: Home . Location of the provider: Office - This visit type was conducted due to national recommendations for restrictions regarding the COVID-19 Pandemic (e.g. social distancing) in an effort to limit this patient's exposure and mitigate transmission in our community.    - No physical exam could be performed with this format, beyond that communicated to [place] by the patient/ family members as noted.   - Additionally my office staff/ schedulers were to discuss with the patient that there may be a monetary charge related to this service, depending on their medical insurance.  My understanding is that patient understood and consented to proceed.     _________________________________________________________________________________   History of Present Illness: Patient calls in to follow up on hyperlipidemia and hypertension. Has no acute concerns today.  HLD: Pt taking medication as directed without issues. Plans to start making dietary changes and start Mediterranean diet.  HTN: Pt denies chest pain, palpitations, dizziness, headache or lower extremity swelling. Taking medication as directed without side effects. Does not check BP regularly at home. States did not schedule cardiology appointment, unsure why but does not feel like he needs it.       GAD 7 : Generalized Anxiety Score 05/11/2019  Nervous, Anxious, on Edge 0  Control/stop worrying 0  Worry too much - different things 0  Trouble relaxing 0  Restless 0  Easily annoyed or irritable 0  Afraid - awful might happen 0  Total GAD 7 Score 0  Anxiety Difficulty Not difficult at all    Depression screen [facility] 2/9 12/31/2020 11/13/2020 04/02/2020 11/28/2019 05/11/2019  Decreased Interest 0 2 0 0 0  Down,  Depressed, Hopeless 0 0 0 0 0  PHQ - 2 Score 0 2 0 0 0  Altered sleeping 0 2 0 1 0  Tired, decreased energy 0 0 0 0 0  Change in appetite 0 1 0 0 0  Feeling bad or failure about yourself  0 0 0 0 0  Trouble concentrating 0 0 0 0 0  Moving slowly or fidgety/restless 0 0 0 0 0  Suicidal thoughts 0 0 0 0 0  PHQ-9 Score 0 5 0 1 0  Difficult doing work/chores - - - Not difficult at all Not difficult at all      Impression and Recommendations:     1. Pure hypercholesterolemia   2. HTN, goal below 140/90     Pure hypercholesterolemia: -Last lipid panel: total cholesterol 141, triglycerides 79, HDL 97, LDL 28 -Continue current medication regimen. Provided refill. Last hepatic function stable. -Encourage to start Mediterranean diet and stay as active as possible. -Will continue to monitor and plan to repeat lipid panel and hepatic function with [facility].  Hypertension, goal below 140/90: -Patient obtained BP today: 144/77, pulse 99 -Discussed with patient to check BP and pulse at home more regularly and to keep a log. Advised to notify the office if BP consistently >140/90 pulse >100 and recommend medication adjustments by adding beta blocker to help with elevated heart rate and blood pressure. Patient verbalized understanding and agreeable.  -Recommend to reduce sodium and increase hydration. -Last CMP renal function and electrolytes wnl. -Will continue to monitor and plan to repeat CMP with [facility].   - As  part of my medical decision making, I reviewed the following data within the electronic MEDICAL RECORD NUMBER History obtained from pt /family, CMA notes reviewed and incorporated if applicable, Labs reviewed, Radiograph/ tests reviewed if applicable and OV notes from prior OV's with me, as well as any other specialists she/he has seen since seeing me last, were all reviewed and used in my medical decision making process today.    - Additionally, when appropriate, discussion had with patient  regarding our treatment plan, and their biases/concerns about that plan were used in my medical decision making today.    - The patient agreed with the plan and demonstrated an understanding of the instructions.   No barriers to understanding were identified.     - The patient was advised to call back or seek an in-person evaluation if the symptoms worsen or if the condition fails to improve as anticipated.   Return for [facility] and [facility] 1 wk prior in 3-4 months .    No orders of the defined types were placed in this encounter.   Meds ordered this encounter  Medications  . simvastatin (ZOCOR) 10 MG tablet    Sig: Take 1 tablet (10 mg total) by mouth daily at 6 PM. TAKE 1 TABLET BY MOUTH EVERYDAY AT BEDTIME    Dispense:  90 tablet    Refill:  1    Medications Discontinued During This Encounter  Medication Reason  . simvastatin (ZOCOR) 10 MG tablet Reorder       Time spent on visit including pre-visit chart review and post-visit care was 12 minutes.      The 21st Century Cures Act was signed into law in 2016 which includes the topic of electronic health records.  This provides immediate access to information in MyChart.  This includes consultation notes, operative notes, office notes, lab results and pathology reports.  If you have any questions about what you read please let us know at your next visit or call us at the office.  We are right here with you.  Note:  This note was prepared with assistance of Dragon voice recognition software. Occasional wrong-word or sound-a-like substitutions may have occurred due to the inherent limitations of voice recognition software.  __________________________________________________________________________________     Patient Care Team    Relationship Specialty Notifications Start End  Cody Perez, [place] PCP - General   04/14/20   Nyoka Cowden, MD Consulting Physician Pulmonary Disease  05/11/19   Deatra Robinson, NP  Nurse Practitioner   05/11/19    Comment: Works with Dr. Nolen Mu of psychiatry- Stark Klein, NP Physician Assistant Nurse Practitioner  11/28/19    Comment: [facility]- not sure of supervising physician  Nadara Mustard, MD Consulting Physician Orthopedic Surgery  11/28/19      -Vitals obtained; medications/ allergies reconciled;  personal medical, social, Sx etc.histories were updated by CMA, reviewed by me and are reflected in chart   Patient Active Problem List   Diagnosis Date Noted  . 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 Weakness of right leg 11/28/2019  . Unsteady gait when walking- due to Quad muscle rupture and repair 2015 11/28/2019  . Vitamin D insufficiency 05/11/2019  . Upper airway cough syndrome 11/08/2018  . Current smoker-   1/2 ppd for 4 yrs or so 11/03/2018  . HTN, goal below 140/90 04/15/2016  . Onychomycosis of toenail 11/30/2013  . MITRAL REGURGITATION-  with heart murmur 05/18/2008  . 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 Hyperlipidemia 03/03/2008  . DEMENTIA 03/03/2008  . ANXIETY 03/03/2008  . 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 PERIPHERAL NEUROPATHY 03/03/2008  . COPD GOLD II active smoker 03/03/2008     Current Meds  Medication Sig  . ABILIFY 10 MG tablet Take 5 mg by mouth daily.   Marland Kitchen aspirin 325 MG tablet Take 325 mg by mouth daily.  . B Complex-C-Folic Acid (STRESS B COMPLEX PO) Take 1 tablet by mouth daily.  . clonazePAM (KLONOPIN) 1 MG tablet Take 1 mg by mouth 2 (two) times daily.  Marland Kitchen donepezil (ARICEPT) 10 MG tablet 1 daily (Patient taking differently: Take 10 mg by mouth at bedtime. 1 daily)  . ibuprofen (ADVIL) 600 MG tablet Take 1 tablet (600 mg total) by mouth every 8 (eight) hours as needed for moderate pain.  Marland Kitchen irbesartan (AVAPRO) 150 MG tablet Take 1 tablet (150 mg total) by mouth daily.   Marland Kitchen lamoTRIgine (LAMICTAL) 200 MG tablet Take 200 mg by mouth daily.   . Multiple Vitamin (MULTIVITAMIN) capsule Take 1 capsule by mouth daily.  . QUEtiapine (SEROQUEL) 300 MG tablet Take 300 mg by mouth at bedtime.   . risperiDONE (RISPERDAL) 3 MG tablet Take 3 mg by mouth daily.   . [DISCONTINUED] simvastatin (ZOCOR) 10 MG tablet TAKE 1 TABLET BY MOUTH EVERYDAY AT BEDTIME **PATIENT NEEDS APPOINTMENT FOR FURTHER REFILLS**     Allergies:  No Known Allergies   ROS:  See above HPI for pertinent positives and negatives   Objective:   Blood pressure (!) 147/77, pulse (!) 114, height 5\' 9"  (1.753 m), weight 180 lb (81.6 kg).  (if some vitals are omitted, this means that patient was UNABLE to obtain them.) General: A & O * 3; sounds in no acute distress; in usual state of health.  Respiratory: speaking in full sentences, no conversational dyspnea Psych: insight appears good, mood- appears full

## 2021-01-13 ENCOUNTER — Ambulatory Visit: Payer: Medicare Other | Admitting: Orthopedic Surgery

## 2021-01-13 ENCOUNTER — Other Ambulatory Visit: Payer: Self-pay

## 2021-01-13 ENCOUNTER — Ambulatory Visit (INDEPENDENT_AMBULATORY_CARE_PROVIDER_SITE_OTHER): Payer: Medicare Other | Admitting: Physical Therapy

## 2021-01-13 DIAGNOSIS — R2689 Other abnormalities of gait and mobility: Secondary | ICD-10-CM

## 2021-01-13 DIAGNOSIS — M545 Low back pain, unspecified: Secondary | ICD-10-CM

## 2021-01-13 DIAGNOSIS — R293 Abnormal posture: Secondary | ICD-10-CM

## 2021-01-13 DIAGNOSIS — M6281 Muscle weakness (generalized): Secondary | ICD-10-CM | POA: Diagnosis not present

## 2021-01-13 DIAGNOSIS — G8929 Other chronic pain: Secondary | ICD-10-CM

## 2021-01-13 DIAGNOSIS — R262 Difficulty in walking, not elsewhere classified: Secondary | ICD-10-CM | POA: Diagnosis not present

## 2021-01-13 NOTE — Therapy (Signed)
[facility] [address] [place], [place], [address] Phone: [phone]   Fax:  [phone]  Physical Therapy Treatment  Patient Details  Name: Cody Perez MRN: [id number] Date of Birth: [birth year] Referring Provider (PT): Nadara Mustard MD   Encounter Date: 01/13/2021   PT End of Session - 01/13/21 1052    Visit Number 2    Number of Visits 16    PT Start Time 1011    PT Stop Time 1051    PT Time Calculation (min) 40 min    Activity Tolerance Patient tolerated treatment well;No increased pain    Behavior During Therapy WFL for tasks assessed/performed           Past Medical History:  Diagnosis Date  . Hyperlipidemia   . Hypertension     No past surgical history on file.  There were no vitals filed for this visit.   Subjective Assessment - 01/13/21 1012    Subjective Patient feels pretty good pain is minimal. He relays he feels better after taking a nap this morning, exercises have been going well. He feels some tightness with back extensions at the wall.    Pertinent History R quadriceps rupture and repair 2015, smoker, COPD, dementia, peripheral neuropathy, HTN    Limitations Sitting;Writing;Reading;House hold activities;Lifting;Standing;Walking    How long can you sit comfortably? Sits most of the day to work    How long can you stand comfortably? Limited to a few minutes    How long can you walk comfortably? 100 feet    Patient Stated Goals Decrease low back pain    Pain Onset More than a month ago              [facility] PT Assessment - 01/13/21 0001      Strength   Overall Strength Comments MMT  tested in short sitting    Strength Assessment Site Hip;Knee;Ankle    Right/Left Hip Right;Left    Right Hip Flexion 3/5    Right Hip ABduction 3+/5    Left Hip Flexion 3+/5    Left Hip ABduction 3+/5    Right/Left Knee Right;Left    Right Knee Flexion 3/5    Right Knee Extension 4-/5    Left Knee Flexion 3+/5    Left Knee  Extension 4-/5    Right/Left Ankle Right;Left    Right Ankle Dorsiflexion 4/5    Right Ankle Plantar Flexion 4/5    Left Ankle Dorsiflexion 4/5    Left Ankle Plantar Flexion 4/5                         [facility] Adult PT Treatment/Exercise - 01/13/21 0001      Lumbar Exercises: Stretches   Figure 4 Stretch 2 reps;10 seconds;Supine;With overpressure    Other Lumbar Stretch Exercise knee to chest SL 10 sec hold 2x bilat      Lumbar Exercises: Aerobic   Nustep L4 x8 min      Lumbar Exercises: Standing   Theraband Level (Row) Level 2 (Red)   2x10   Shoulder Extension Limitations red 2x10    Other Standing Lumbar Exercises standing marches 10 each side, tandem stance 20 sec bilat 2x, hip abd 1.5 lbs 10x bilat, hamstring curl 1.5 lbs 10x bilat      Lumbar Exercises: Seated   Other Seated Lumbar Exercises shoulder horizontal abduction 10x      Lumbar Exercises: Supine   Bridge 10 reps;1 second  Other Supine Lumbar Exercises ball squeeze adduction in bridge position 5 sec isometric 10x, trunk rotation knees bent 10x each side                       PT Long Term Goals - 12/26/20 1647      PT LONG TERM GOAL #1   Title Improve FOTO to 64.    Baseline 43    Time 8    Period Weeks    Status New    Target Date 03/13/21      PT LONG TERM GOAL #2   Title Improve trunk extension AROM to 10 degrees.    Baseline 0    Time 8    Period Weeks    Status New      PT LONG TERM GOAL #3   Title Cody Perez will report low back pain consistently 0-3/10 on the Numeric Pain Rating Scale.    Baseline Can be 5+    Time 8    Period Weeks    Status New    Target Date 03/13/21      PT LONG TERM GOAL #4   Title Cody Perez will be independent with his long-term HEP.    Time 8    Period Weeks    Status New    Target Date 03/13/21                 Plan - 01/13/21 1053    Clinical Impression Statement Tested Cody Perez's LE strength in sitting today and found deficits with all  motions that need to be addressed with a strenghtening program. Patient tolerated all exercises well with verbal and tactile cueing for correct form. Patient felt some soreness after today but felt good. Patinet did feel slightly dizzy after transition from supine to sitting but subsided quickly.    Personal Factors and Comorbidities Comorbidity 1    Comorbidities R Quad rupture and repair 2015  Smoker  COPD  Dementia  Peripheral neuropathy  HTN    Examination-Activity Limitations Sit;Bed Mobility;Bend;Lift;Squat;Stairs;Stand    Examination-Participation Restrictions Occupation;Community Activity    Stability/Clinical Decision Making Stable/Uncomplicated    Rehab Potential Good    PT Frequency 2x / week    PT Duration 8 weeks    PT Treatment/Interventions ADLs/Self Care Home Management;Moist Heat;Cryotherapy;Therapeutic activities;Therapeutic exercise;Neuromuscular re-education;Patient/family education;Manual techniques;Dry needling    PT Next Visit Plan address leg strength and balance deficits, continue with posture work, update HEP PRN    PT Home Exercise Plan Access Code: [email or web address]    Consulted and Agree with Plan of Care Patient           Patient will benefit from skilled therapeutic intervention in order to improve the following deficits and impairments:  Decreased endurance,Decreased range of motion,Decreased strength,Difficulty walking,Improper body mechanics,Postural dysfunction,Pain  Visit Diagnosis: Difficulty walking  Abnormal posture  Muscle weakness (generalized)  Chronic bilateral low back pain without sciatica  Other abnormalities of gait and mobility     Problem List Patient Active Problem List   Diagnosis Date Noted  . 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 Weakness of right leg 11/28/2019  . Unsteady gait when walking- due to Quad muscle rupture and repair 2015 11/28/2019  . Vitamin D insufficiency 05/11/2019  .  Upper airway cough syndrome 11/08/2018  . Current smoker-   1/2 ppd for 4 yrs or so 11/03/2018  . HTN, goal below 140/90 04/15/2016  . Onychomycosis of toenail  11/30/2013  . MITRAL REGURGITATION- with heart murmur 05/18/2008  . 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 Hyperlipidemia 03/03/2008  . DEMENTIA 03/03/2008  . ANXIETY 03/03/2008  . 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 PERIPHERAL NEUROPATHY 03/03/2008  . COPD GOLD II active smoker 03/03/2008    Ritta Slot, SPT 01/13/2021, 12:42 PM   During this treatment session, this physical therapist was present, participating in and directing the treatment.   This note has been reviewed and this clinician agrees with the information provided.  Ivery Quale, PT, DPT 01/13/21 1:36 PM    [facility] [address] [place], [place], [address] Phone: [phone]   Fax:  [phone]  Name: DEO MEHRINGER MRN: [id number] Date of Birth: [birth year]

## 2021-01-16 ENCOUNTER — Encounter: Payer: Self-pay | Admitting: Rehabilitative and Restorative Service Providers"

## 2021-01-16 ENCOUNTER — Ambulatory Visit (INDEPENDENT_AMBULATORY_CARE_PROVIDER_SITE_OTHER): Payer: Medicare Other | Admitting: Rehabilitative and Restorative Service Providers"

## 2021-01-16 ENCOUNTER — Other Ambulatory Visit: Payer: Self-pay

## 2021-01-16 DIAGNOSIS — R262 Difficulty in walking, not elsewhere classified: Secondary | ICD-10-CM

## 2021-01-16 DIAGNOSIS — M545 Low back pain, unspecified: Secondary | ICD-10-CM | POA: Diagnosis not present

## 2021-01-16 DIAGNOSIS — G8929 Other chronic pain: Secondary | ICD-10-CM | POA: Diagnosis not present

## 2021-01-16 DIAGNOSIS — M6281 Muscle weakness (generalized): Secondary | ICD-10-CM

## 2021-01-16 DIAGNOSIS — R293 Abnormal posture: Secondary | ICD-10-CM | POA: Diagnosis not present

## 2021-01-16 NOTE — Therapy (Signed)
[facility] Physical Therapy [address] [place], [place], [address] Phone: [phone]   Fax:  [phone]  Physical Therapy Treatment  Patient Details  Name: Cody Perez MRN: [id number] Date of Birth: 1951-06-28 Referring Provider (PT): Nadara Mustard MD   Encounter Date: 01/16/2021   PT End of Session - 01/16/21 1632    Visit Number 3    Number of Visits 16    PT Start Time 0845    PT Stop Time 0929    PT Time Calculation (min) 44 min    Activity Tolerance Patient tolerated treatment well;No increased pain;Patient limited by fatigue    Behavior During Therapy [facility] for tasks assessed/performed           Past Medical History:  Diagnosis Date  . Hyperlipidemia   . Hypertension     History reviewed. No pertinent surgical history.  There were no vitals filed for this visit.   Subjective Assessment - 01/16/21 0913    Subjective Cody Perez notes inconsistent HEP compliance.  He is taking an Aleve 2X/day 1 pill each.  He does get pain relief with this.    Pertinent History R quadriceps rupture and repair 2015, smoker, COPD, dementia, peripheral neuropathy, HTN    Limitations Sitting;Writing;Reading;House hold activities;Lifting;Standing;Walking    How long can you sit comfortably? Sits most of the day to work    How long can you stand comfortably? Limited to a few minutes    How long can you walk comfortably? 100 feet    Patient Stated Goals Decrease low back pain    Currently in Pain? Yes    Pain Score 4     Pain Location Back    Pain Orientation Lower    Pain Descriptors / Indicators Aching;Sore    Pain Type Chronic pain    Pain Onset More than a month ago    Pain Frequency Intermittent    Aggravating Factors  Standing and walking are worst due to postural impairments.    Pain Relieving Factors Change of position or exercises.    Effect of Pain on Daily Activities Limited standing and walking endurance.    Multiple Pain Sites No                              [facility] Adult PT Treatment/Exercise - 01/16/21 0001      Posture/Postural Control   Posture/Postural Control Postural limitations    Postural Limitations Forward head;Rounded Shoulders;Decreased lumbar lordosis      Therapeutic Activites    Therapeutic Activities ADL's    ADL's Posture, spine anatomy, basic body mechanics (log roll), importance of frequent changes of position, home walking and exercise program review      Exercises   Exercises Lumbar      Lumbar Exercises: Machines for Strengthening   Leg Press Double leg 75#; single leg 31# slow eccentrics with all 10X each      Lumbar Exercises: Standing   Shoulder ADduction Strengthening;Both;10 reps;Other (comment)    Shoulder Adduction Limitations 5 seconds (shoulder blade pinches)    Other Standing Lumbar Exercises Trunk extension AROM 10X 3 seconds    Other Standing Lumbar Exercises Alternating hip hike 2 sets of 10 for 3 seconds      Lumbar Exercises: Seated   Sit to Stand 5 reps;Other (comment)   2 sets.  Start sitting with SBP, stand then trunk extension at top before slow eccentric sit  PT Education - 01/16/21 1630    Education Details Reviewed HEP, baody mechanics, importance of frequent change of position and progressed home walking on the treadmill to 15-20 minutes (currently 15).    Person(s) Educated Patient    Methods Explanation;Demonstration;Verbal cues    Comprehension Returned demonstration;Need further instruction;Verbalized understanding;Verbal cues required               PT Long Term Goals - 01/16/21 1631      PT LONG TERM GOAL #1   Title Improve FOTO to 64.    Baseline 43    Time 8    Period Weeks    Status On-going      PT LONG TERM GOAL #2   Title Improve trunk extension AROM to 10 degrees.    Baseline 0    Time 8    Period Weeks    Status On-going      PT LONG TERM GOAL #3   Title Cody Perez will report low back pain consistently  0-3/10 on the Numeric Pain Rating Scale.    Baseline Can be 5+    Time 8    Period Weeks    Status On-going      PT LONG TERM GOAL #4   Title Cody Perez will be independent with his long-term HEP.    Time 8    Period Weeks    Status On-going                 Plan - 01/16/21 1632    Clinical Impression Statement Cody Perez has been walking for up to 15 minutes at a time on his treadmill which is great.  Progression to 20 minutes was encouraged.  He admits compliance with his other exercises have not been as good and he will benefit from the recommended HEP compliance.  Continue postural, core and LE strength work to decrease pain with standing and walking and improve Cody Perez's self-reported function.    Personal Factors and Comorbidities Comorbidity 1    Comorbidities R Quad rupture and repair 2015  Smoker  COPD  Dementia  Peripheral neuropathy  HTN    Examination-Activity Limitations Sit;Bed Mobility;Bend;Lift;Squat;Stairs;Stand    Examination-Participation Restrictions Occupation;Community Activity    Stability/Clinical Decision Making Stable/Uncomplicated    Rehab Potential Good    PT Frequency 2x / week    PT Duration 8 weeks    PT Treatment/Interventions ADLs/Self Care Home Management;Moist Heat;Cryotherapy;Therapeutic activities;Therapeutic exercise;Neuromuscular re-education;Patient/family education;Manual techniques;Dry needling    PT Next Visit Plan Address leg strength and balance deficits, continue with posture work, update HEP PRN    PT Home Exercise Plan Access Code: [email or web address]    Consulted and Agree with Plan of Care Patient           Patient will benefit from skilled therapeutic intervention in order to improve the following deficits and impairments:  Decreased endurance,Decreased range of motion,Decreased strength,Difficulty walking,Improper body mechanics,Postural dysfunction,Pain  Visit Diagnosis: Abnormal posture  Difficulty walking  Muscle weakness  (generalized)  Chronic bilateral low back pain without sciatica     Problem List Patient Active Problem List   Diagnosis Date Noted  . 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 Weakness of right leg 11/28/2019  . Unsteady gait when walking- due to Quad muscle rupture and repair 2015 11/28/2019  . Vitamin D insufficiency 05/11/2019  . Upper airway cough syndrome 11/08/2018  . Current smoker-   1/2 ppd for 4 yrs or so 11/03/2018  .  HTN, goal below 140/90 04/15/2016  . Onychomycosis of toenail 11/30/2013  . MITRAL REGURGITATION- with heart murmur 05/18/2008  . 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 Hyperlipidemia 03/03/2008  . DEMENTIA 03/03/2008  . ANXIETY 03/03/2008  . 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 PERIPHERAL NEUROPATHY 03/03/2008  . COPD GOLD II active smoker 03/03/2008    Cherlyn Cushing PT, MPT 01/16/2021, 4:35 PM  [facility] Physical Therapy [address] [place], [place], [address] Phone: [phone]   Fax:  [phone]  Name: Cody Perez MRN: [id number] Date of Birth: 07-Apr-1951

## 2021-01-21 ENCOUNTER — Ambulatory Visit (INDEPENDENT_AMBULATORY_CARE_PROVIDER_SITE_OTHER): Payer: Medicare Other | Admitting: Physical Therapy

## 2021-01-21 ENCOUNTER — Other Ambulatory Visit: Payer: Self-pay

## 2021-01-21 DIAGNOSIS — M545 Low back pain, unspecified: Secondary | ICD-10-CM

## 2021-01-21 DIAGNOSIS — G8929 Other chronic pain: Secondary | ICD-10-CM | POA: Diagnosis not present

## 2021-01-21 DIAGNOSIS — M6281 Muscle weakness (generalized): Secondary | ICD-10-CM | POA: Diagnosis not present

## 2021-01-21 DIAGNOSIS — R293 Abnormal posture: Secondary | ICD-10-CM | POA: Diagnosis not present

## 2021-01-21 DIAGNOSIS — R2689 Other abnormalities of gait and mobility: Secondary | ICD-10-CM

## 2021-01-21 DIAGNOSIS — R262 Difficulty in walking, not elsewhere classified: Secondary | ICD-10-CM | POA: Diagnosis not present

## 2021-01-21 NOTE — Therapy (Signed)
[facility] Physical Therapy [address] [place], [place], [address] Phone: [phone]   Fax:  [phone]  Physical Therapy Treatment  Patient Details  Name: Cody Perez MRN: [id number] Date of Birth: Aug 10, 1951 Referring Provider (PT): Nadara Mustard MD   Encounter Date: 01/21/2021   PT End of Session - 01/21/21 0926    Visit Number 4    Number of Visits 16    PT Start Time 0839    PT Stop Time 0921    PT Time Calculation (min) 42 min    Activity Tolerance Patient tolerated treatment well;No increased pain;Patient limited by fatigue    Behavior During Therapy [facility] for tasks assessed/performed           Past Medical History:  Diagnosis Date  . Hyperlipidemia   . Hypertension     No past surgical history on file.  There were no vitals filed for this visit.   Subjective Assessment - 01/21/21 0841    Subjective Patient reports no pain today and is feeling good. He believes the exercises are making him feel better.    Pertinent History R quadriceps rupture and repair 2015, smoker, COPD, dementia, peripheral neuropathy, HTN    Limitations Sitting;Writing;Reading;House hold activities;Lifting;Standing;Walking    How long can you sit comfortably? Sits most of the day to work    How long can you stand comfortably? Limited to a few minutes    How long can you walk comfortably? 100 feet    Patient Stated Goals Decrease low back pain    Currently in Pain? No/denies    Pain Onset More than a month ago            [facility] Adult PT Treatment/Exercise - 01/21/21 0001      Neuro Re-ed    Neuro Re-ed Details  tandem stance with one hand UE bilat 20 sec x2, feet together on airex with 2 fingers UE support with head turns and nods 3 mins      Lumbar Exercises: Stretches   Figure 4 Stretch Seated;20 seconds;2 reps;With overpressure    Other Lumbar Stretch Exercise knee to chest SL 10 sec hold bilat 3x      Lumbar Exercises: Aerobic   Nustep L5 x8 min UE/LE       Lumbar Exercises: Machines for Strengthening   Leg Press Double leg 62 lbs 2x10      Lumbar Exercises: Standing   Row 20 reps    Theraband Level (Row) Level 3 (Green)    Shoulder Extension Limitations 2x10green    Other Standing Lumbar Exercises Lateral step up and over 4'' step with UE 10x, standing marching 10x each leg      Lumbar Exercises: Seated   Sit to Stand 10 reps   no UE support     Lumbar Exercises: Supine   Clam 20 reps   red band   Bridge 10 reps;1 second    Other Supine Lumbar Exercises ball squeeze adduction in bridge position 5 sec isometric 10x, trunk rotation knees bent 10x each side                       PT Long Term Goals - 01/16/21 1631      PT LONG TERM GOAL #1   Title Improve FOTO to 64.    Baseline 43    Time 8    Period Weeks    Status On-going      PT LONG TERM GOAL #2  Title Improve trunk extension AROM to 10 degrees.    Baseline 0    Time 8    Period Weeks    Status On-going      PT LONG TERM GOAL #3   Title Cody Perez will report low back pain consistently 0-3/10 on the Numeric Pain Rating Scale.    Baseline Can be 5+    Time 8    Period Weeks    Status On-going      PT LONG TERM GOAL #4   Title Cody Perez will be independent with his long-term HEP.    Time 8    Period Weeks    Status On-going                 Plan - 01/21/21 7017    Clinical Impression Statement Cody Perez is making good progress with his musclar strength and endurance. He demonstrates good ability to complete activities without fatigue but still needs several verbal and tactile cues for correct form with exercises. Patient can benefit from continued UE/LE strenghtening paired with balance and coordination training for increased stability and decreased pain with ADLs.    Personal Factors and Comorbidities Comorbidity 1    Comorbidities R Quad rupture and repair 2015  Smoker  COPD  Dementia  Peripheral neuropathy  HTN    Examination-Activity Limitations Sit;Bed  Mobility;Bend;Lift;Squat;Stairs;Stand    Examination-Participation Restrictions Occupation;Community Activity    Stability/Clinical Decision Making Stable/Uncomplicated    Rehab Potential Good    PT Frequency 2x / week    PT Duration 8 weeks    PT Treatment/Interventions ADLs/Self Care Home Management;Moist Heat;Cryotherapy;Therapeutic activities;Therapeutic exercise;Neuromuscular re-education;Patient/family education;Manual techniques;Dry needling    PT Next Visit Plan balance progression, continue LE strengthening- posterior chain focus    PT Home Exercise Plan Access Code: [email or web address]    Consulted and Agree with Plan of Care Patient           Patient will benefit from skilled therapeutic intervention in order to improve the following deficits and impairments:  Decreased endurance,Decreased range of motion,Decreased strength,Difficulty walking,Improper body mechanics,Postural dysfunction,Pain  Visit Diagnosis: Abnormal posture  Difficulty walking  Muscle weakness (generalized)  Chronic bilateral low back pain without sciatica  Other abnormalities of gait and mobility     Problem List Patient Active Problem List   Diagnosis Date Noted  . 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 Weakness of right leg 11/28/2019  . Unsteady gait when walking- due to Quad muscle rupture and repair 2015 11/28/2019  . Vitamin D insufficiency 05/11/2019  . Upper airway cough syndrome 11/08/2018  . Current smoker-   1/2 ppd for 4 yrs or so 11/03/2018  . HTN, goal below 140/90 04/15/2016  . Onychomycosis of toenail 11/30/2013  . MITRAL REGURGITATION- with heart murmur 05/18/2008  . 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 Hyperlipidemia 03/03/2008  . DEMENTIA 03/03/2008  . ANXIETY 03/03/2008  . 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 PERIPHERAL NEUROPATHY 03/03/2008   . COPD GOLD II active smoker 03/03/2008    Ritta Slot, SPT 01/21/2021, 9:31 AM   During this treatment session, this physical therapist was present, participating in and directing the treatment.   This note has been reviewed and this clinician agrees with the information provided.   Ivery Quale, PT, DPT 01/21/21 10:22 AM         [facility] Physical Therapy [address]  [place], [place], [address] Phone: [phone]   Fax:  [phone]  Name: Cody Perez MRN: [id number] Date of Birth: 04/13/1951

## 2021-01-23 ENCOUNTER — Encounter: Payer: Self-pay | Admitting: Rehabilitative and Restorative Service Providers"

## 2021-01-23 ENCOUNTER — Ambulatory Visit (INDEPENDENT_AMBULATORY_CARE_PROVIDER_SITE_OTHER): Payer: Medicare Other | Admitting: Rehabilitative and Restorative Service Providers"

## 2021-01-23 ENCOUNTER — Other Ambulatory Visit: Payer: Self-pay

## 2021-01-23 DIAGNOSIS — G8929 Other chronic pain: Secondary | ICD-10-CM

## 2021-01-23 DIAGNOSIS — R293 Abnormal posture: Secondary | ICD-10-CM

## 2021-01-23 DIAGNOSIS — R262 Difficulty in walking, not elsewhere classified: Secondary | ICD-10-CM

## 2021-01-23 DIAGNOSIS — M6281 Muscle weakness (generalized): Secondary | ICD-10-CM

## 2021-01-23 DIAGNOSIS — M545 Low back pain, unspecified: Secondary | ICD-10-CM

## 2021-01-23 NOTE — Therapy (Signed)
[facility] Physical Therapy [address] [place], [place], [address] Phone: [phone]   Fax:  [phone]  Physical Therapy Treatment  Patient Details  Name: Cody Perez MRN: [id number] Date of Birth: [birth year] Referring Provider (PT): Nadara Mustard MD   Encounter Date: 01/23/2021   PT End of Session - 01/23/21 1133    Visit Number 5    Number of Visits 16    PT Start Time 0845    PT Stop Time 0926    PT Time Calculation (min) 41 min    Activity Tolerance Patient tolerated treatment well;No increased pain;Patient limited by fatigue    Behavior During Therapy [facility] for tasks assessed/performed           Past Medical History:  Diagnosis Date  . Hyperlipidemia   . Hypertension     History reviewed. No pertinent surgical history.  There were no vitals filed for this visit.   Subjective Assessment - 01/23/21 0849    Subjective Cody Perez notes less pain in the AM.  He has increased his walking to 18 minutes (from 15 last week) on the treadmill.  He does note more R hip pain with standing (2/10 intensity).    Pertinent History R quadriceps rupture and repair 2015, smoker, COPD, dementia, peripheral neuropathy, HTN    Limitations Sitting;Writing;Reading;House hold activities;Lifting;Standing;Walking    How long can you sit comfortably? Sits most of the day to work    How long can you stand comfortably? 30-60 minutes (was limited to a few minutes)    How long can you walk comfortably? 18 minutes on treadmill (was 100 feet)    Patient Stated Goals Decrease low back pain    Currently in Pain? Yes    Pain Score 2     Pain Location Back    Pain Orientation Lower    Pain Descriptors / Indicators Aching;Sore    Pain Type Chronic pain    Pain Radiating Towards No peripheral symptoms    Pain Onset More than a month ago    Pain Frequency Intermittent    Aggravating Factors  Walking without the treadmill (UE support)    Pain Relieving Factors Change of position or  exercises    Effect of Pain on Daily Activities Walking without UE support    Multiple Pain Sites No                             [facility] Adult PT Treatment/Exercise - 01/23/21 0001      Posture/Postural Control   Posture/Postural Control Postural limitations    Postural Limitations Forward head;Rounded Shoulders;Decreased lumbar lordosis      Therapeutic Activites    Therapeutic Activities ADL's    ADL's Posture, spine anatomy, basic body mechanics (log roll), importance of frequent changes of position, home walking and exercise program review      Exercises   Exercises Lumbar      Lumbar Exercises: Machines for Strengthening   Leg Press Double leg 75#; single leg 31# slow eccentrics with all 10X each    Other Lumbar Machine Exercise Lat pull to chest in slight extension 25# 10X    Other Lumbar Machine Exercise Pull to chest 25# 10X      Lumbar Exercises: Standing   Shoulder ADduction Strengthening;Both;10 reps;Other (comment)    Shoulder Adduction Limitations 5 second hold (shoulder blade pinches)    Other Standing Lumbar Exercises Trunk extension AROM 10X 3 seconds  Other Standing Lumbar Exercises Alternating hip hike and alternating hip extensions 2 sets of 10 each for 3 seconds      Lumbar Exercises: Seated   Sit to Stand 5 reps;Other (comment)   2 sets.  Start sitting with SBP, stand then trunk extension at top before slow eccentric sit                 PT Education - 01/23/21 1191    Education Details Reviewed and updated HEP.  Heavy emphasis on avoiding forward flexion and R knee hyper-extension with WB.    Person(s) Educated Patient    Methods Explanation;Demonstration;Tactile cues;Verbal cues;Handout    Comprehension Verbal cues required;Returned demonstration;Need further instruction;Tactile cues required;Verbalized understanding               PT Long Term Goals - 01/23/21 0938      PT LONG TERM GOAL #1   Title Improve FOTO to 64.     Baseline 43    Time 8    Period Weeks    Status On-going      PT LONG TERM GOAL #2   Title Improve trunk extension AROM to 10 degrees.    Baseline 0    Time 8    Period Weeks    Status On-going      PT LONG TERM GOAL #3   Title Cody Perez will report low back pain consistently 0-3/10 on the Numeric Pain Rating Scale.    Baseline Can be 5+    Time 8    Period Weeks    Status On-going      PT LONG TERM GOAL #4   Title Cody Perez will be independent with his long-term HEP.    Time 8    Period Weeks    Status On-going                 Plan - 01/23/21 1134    Clinical Impression Statement Cody Perez reports taking more frequnt breaks from sitting and increasing his treadmill walking.  R LE weakness due to an old quadriceps tendon rupture and repair is also being addressed as Cody Perez will need good leg strength to protect his back with good body mechanics.  Continue core, postural and LE strength work to continue to improve function and decrease pain.    Personal Factors and Comorbidities Comorbidity 1    Comorbidities R Quad rupture and repair 2015  Smoker  COPD  Dementia  Peripheral neuropathy  HTN    Examination-Activity Limitations Sit;Bed Mobility;Bend;Lift;Squat;Stairs;Stand    Examination-Participation Restrictions Occupation;Community Activity    Stability/Clinical Decision Making Stable/Uncomplicated    Rehab Potential Good    PT Frequency 2x / week    PT Duration 8 weeks    PT Treatment/Interventions ADLs/Self Care Home Management;Moist Heat;Cryotherapy;Therapeutic activities;Therapeutic exercise;Neuromuscular re-education;Patient/family education;Manual techniques;Dry needling    PT Next Visit Plan balance progression, continue LE strengthening- posterior chain focus    PT Home Exercise Plan Access Code: [email or web address]    Consulted and Agree with Plan of Care Patient           Patient will benefit from skilled therapeutic intervention in order to improve the following deficits and  impairments:  Decreased endurance,Decreased range of motion,Decreased strength,Difficulty walking,Improper body mechanics,Postural dysfunction,Pain  Visit Diagnosis: Abnormal posture  Difficulty walking  Muscle weakness (generalized)  Chronic bilateral low back pain without sciatica     Problem List Patient Active Problem List   Diagnosis Date Noted  . 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 Weakness of right leg 11/28/2019  . Unsteady gait when walking- due to Quad muscle rupture and repair 2015 11/28/2019  . Vitamin D insufficiency 05/11/2019  . Upper airway cough syndrome 11/08/2018  . Current smoker-   1/2 ppd for 4 yrs or so 11/03/2018  . HTN, goal below 140/90 04/15/2016  . Onychomycosis of toenail 11/30/2013  . MITRAL REGURGITATION- with heart murmur 05/18/2008  . 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 Hyperlipidemia 03/03/2008  . DEMENTIA 03/03/2008  . ANXIETY 03/03/2008  . 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 PERIPHERAL NEUROPATHY 03/03/2008  . COPD GOLD II active smoker 03/03/2008    Cherlyn Cushing PT, MPT 01/23/2021, 11:36 AM  [facility] Physical Therapy [address] [place], [place], [address] Phone: [phone]   Fax:  [phone]  Name: Cody Perez MRN: [id number] Date of Birth: 1951/08/09

## 2021-01-23 NOTE — Patient Instructions (Signed)
Access Code: [email or web address] URL: [email or web address] Date: 01/23/2021 Prepared by: Pauletta Browns  Exercises Standing Lumbar Extension at Wall - Forearms - 5-15 x daily - 7 x weekly - 1 sets - 3-5 reps - 3 seconds hold Standing Scapular Retraction - 5-15 x daily - 7 x weekly - 1 sets - 3-5 reps - 5 second hold Sit to Stand with Armchair - 3-5 x daily - 7 x weekly - 1 sets - 5 reps Standing Hip Hiking - 2 x daily - 7 x weekly - 2 sets - 10 reps - 3 seconds hold Standing Hip Extension - 2 x daily - 7 x weekly - 1 sets - 10 reps - 3 seconds hold

## 2021-01-28 ENCOUNTER — Other Ambulatory Visit: Payer: Self-pay

## 2021-01-28 ENCOUNTER — Ambulatory Visit (INDEPENDENT_AMBULATORY_CARE_PROVIDER_SITE_OTHER): Payer: Medicare Other | Admitting: Physical Therapy

## 2021-01-28 DIAGNOSIS — M6281 Muscle weakness (generalized): Secondary | ICD-10-CM | POA: Diagnosis not present

## 2021-01-28 DIAGNOSIS — M545 Low back pain, unspecified: Secondary | ICD-10-CM | POA: Diagnosis not present

## 2021-01-28 DIAGNOSIS — R293 Abnormal posture: Secondary | ICD-10-CM | POA: Diagnosis not present

## 2021-01-28 DIAGNOSIS — G8929 Other chronic pain: Secondary | ICD-10-CM

## 2021-01-28 DIAGNOSIS — R2689 Other abnormalities of gait and mobility: Secondary | ICD-10-CM | POA: Diagnosis not present

## 2021-01-28 DIAGNOSIS — R262 Difficulty in walking, not elsewhere classified: Secondary | ICD-10-CM | POA: Diagnosis not present

## 2021-01-28 NOTE — Therapy (Signed)
[facility] [address] [place], [place], [address] Phone: [phone]   Fax:  [phone]  Physical Therapy Treatment  Patient Details  Name: Cody Perez MRN: [id number] Date of Birth: 16-Nov-1951 Referring Provider (PT): Nadara Mustard MD   Encounter Date: 01/28/2021   PT End of Session - 01/28/21 0919    Visit Number 6    Number of Visits 16    PT Start Time 0841    PT Stop Time 0925    PT Time Calculation (min) 44 min    Activity Tolerance Patient tolerated treatment well;No increased pain;Patient limited by fatigue    Behavior During Therapy [facility] for tasks assessed/performed           Past Medical History:  Diagnosis Date  . Hyperlipidemia   . Hypertension     No past surgical history on file.  There were no vitals filed for this visit.   Subjective Assessment - 01/28/21 0848    Subjective Patient reports that no pain today but sowly the tightness is going away. He notices a big difference with the lumbar extension exercise. He is still working towards walking on the treadmill for 30 minutes, he is trying 20 minutes today.    Pertinent History R quadriceps rupture and repair 2015, smoker, COPD, dementia, peripheral neuropathy, HTN    Limitations Sitting;Writing;Reading;House hold activities;Lifting;Standing;Walking    How long can you sit comfortably? Sits most of the day to work    How long can you stand comfortably? 30-60 minutes (was limited to a few minutes)    How long can you walk comfortably? 18 minutes on treadmill (was 100 feet)    Patient Stated Goals Decrease low back pain    Currently in Pain? No/denies    Pain Onset More than a month ago                             [facility] Adult PT Treatment/Exercise - 01/28/21 0001      Neuro Re-ed    Neuro Re-ed Details  tandem walking on foam 3 laps with UE support, side step walking with forward gaze and UE support 3 laps      Lumbar Exercises: Aerobic    Nustep L5 x8 min UE/LE      Lumbar Exercises: Machines for Strengthening   Leg Press Double leg 75#  15x; single leg 31# slow eccentrics with all 10X bilat      Lumbar Exercises: Standing   Row 20 reps    Theraband Level (Row) Level 3 (Green)    Shoulder Extension 20 reps;Theraband;Both    Theraband Level (Shoulder Extension) Level 3 (Green)    Other Standing Lumbar Exercises Trunk extension AROM 10X 3 seconds, feet together balance on airex with head turns and nods 10x each    Other Standing Lumbar Exercises standing hip abduction 10x bilat, hip extension bilat 10x both with 2lbs      Lumbar Exercises: Seated   Other Seated Lumbar Exercises adductor ball squeeze 5 sec hold 2x10                       PT Long Term Goals - 01/23/21 9528      PT LONG TERM GOAL #1   Title Improve FOTO to 64.    Baseline 43    Time 8    Period Weeks    Status On-going      PT  LONG TERM GOAL #2   Title Improve trunk extension AROM to 10 degrees.    Baseline 0    Time 8    Period Weeks    Status On-going      PT LONG TERM GOAL #3   Title Cody Perez will report low back pain consistently 0-3/10 on the Numeric Pain Rating Scale.    Baseline Can be 5+    Time 8    Period Weeks    Status On-going      PT LONG TERM GOAL #4   Title Cody Perez will be independent with his long-term HEP.    Time 8    Period Weeks    Status On-going                 Plan - 01/28/21 9371    Clinical Impression Statement Cody Perez reports fewer instances of stiffness from his movement throughout the day. His LE weakness is slowly proressing, he can benefit from muscular endurance training for ability to perform tasks for longer periods of time. We addresed more of his balance and coordination issues today woring with unstable surfaces, patient tolerated well.    Personal Factors and Comorbidities Comorbidity 1    Comorbidities R Quad rupture and repair 2015  Smoker  COPD  Dementia  Peripheral neuropathy  HTN     Examination-Activity Limitations Sit;Bed Mobility;Bend;Lift;Squat;Stairs;Stand    Examination-Participation Restrictions Occupation;Community Activity    Stability/Clinical Decision Making Stable/Uncomplicated    Rehab Potential Good    PT Frequency 2x / week    PT Duration 8 weeks    PT Treatment/Interventions ADLs/Self Care Home Management;Moist Heat;Cryotherapy;Therapeutic activities;Therapeutic exercise;Neuromuscular re-education;Patient/family education;Manual techniques;Dry needling    PT Next Visit Plan balance progression, continue LE strengthening- posterior chain focus, seated ham curls, lumbar extension focus    PT Home Exercise Plan Access Code: [email or web address]    Consulted and Agree with Plan of Care Patient           Patient will benefit from skilled therapeutic intervention in order to improve the following deficits and impairments:  Decreased endurance,Decreased range of motion,Decreased strength,Difficulty walking,Improper body mechanics,Postural dysfunction,Pain  Visit Diagnosis: Abnormal posture  Other abnormalities of gait and mobility  Difficulty walking  Muscle weakness (generalized)  Chronic bilateral low back pain without sciatica     Problem List Patient Active Problem List   Diagnosis Date Noted  . 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 Weakness of right leg 11/28/2019  . Unsteady gait when walking- due to Quad muscle rupture and repair 2015 11/28/2019  . Vitamin D insufficiency 05/11/2019  . Upper airway cough syndrome 11/08/2018  . Current smoker-   1/2 ppd for 4 yrs or so 11/03/2018  . HTN, goal below 140/90 04/15/2016  . Onychomycosis of toenail 11/30/2013  . MITRAL REGURGITATION- with heart murmur 05/18/2008  . 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 Hyperlipidemia 03/03/2008  . DEMENTIA 03/03/2008  . ANXIETY 03/03/2008  . 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 PERIPHERAL NEUROPATHY 03/03/2008  . COPD GOLD II active smoker 03/03/2008    Ritta Slot, SPT 01/28/2021, 9:32 AM   During this treatment session, this physical therapist was present, participating in and directing the treatment.   This note has been reviewed and this clinician agrees with the information provided.  Ivery Quale, PT, DPT 01/28/21 10:24 AM   [facility]  [address] [place], [place], [address] Phone: [phone]   Fax:  [phone]  Name: Cody Perez MRN: [id number] Date of Birth: [birth year]

## 2021-01-30 ENCOUNTER — Encounter: Payer: Self-pay | Admitting: Rehabilitative and Restorative Service Providers"

## 2021-01-30 ENCOUNTER — Ambulatory Visit (INDEPENDENT_AMBULATORY_CARE_PROVIDER_SITE_OTHER): Payer: Medicare Other | Admitting: Rehabilitative and Restorative Service Providers"

## 2021-01-30 ENCOUNTER — Other Ambulatory Visit: Payer: Self-pay

## 2021-01-30 DIAGNOSIS — R262 Difficulty in walking, not elsewhere classified: Secondary | ICD-10-CM | POA: Diagnosis not present

## 2021-01-30 DIAGNOSIS — G8929 Other chronic pain: Secondary | ICD-10-CM | POA: Diagnosis not present

## 2021-01-30 DIAGNOSIS — M545 Low back pain, unspecified: Secondary | ICD-10-CM | POA: Diagnosis not present

## 2021-01-30 DIAGNOSIS — M6281 Muscle weakness (generalized): Secondary | ICD-10-CM | POA: Diagnosis not present

## 2021-01-30 DIAGNOSIS — R293 Abnormal posture: Secondary | ICD-10-CM

## 2021-01-30 NOTE — Addendum Note (Signed)
Addended by: Wende Crease on: 01/30/2021 04:45 PM   Modules accepted: Orders

## 2021-01-30 NOTE — Therapy (Signed)
[facility] [address] [place], [place], [address] Phone: [phone]   Fax:  [phone]  Physical Therapy Treatment  Patient Details  Name: Cody Perez MRN: [id number] Date of Birth: 1950/12/27 Referring Provider (PT): Nadara Mustard MD   Encounter Date: 01/30/2021   PT End of Session - 01/30/21 1639    Visit Number 7    Number of Visits 16    PT Start Time 0845    PT Stop Time 0928    PT Time Calculation (min) 43 min    Activity Tolerance Patient tolerated treatment well;No increased pain;Patient limited by fatigue    Behavior During Therapy [facility] for tasks assessed/performed           Past Medical History:  Diagnosis Date  . Hyperlipidemia   . Hypertension     History reviewed. No pertinent surgical history.  There were no vitals filed for this visit.   Subjective Assessment - 01/30/21 0850    Subjective Cody Perez reports improved leg strength.  He is walking about 15 minutes on a treadmill (was 10 minutes).  He is no longer taking pain medication for his back.  He notes less shoulder pain due to postural improvements as well.  His biggest remaining impairment is standing and walking endurance.    Pertinent History R quadriceps rupture and repair 2015, smoker, COPD, dementia, peripheral neuropathy, HTN    Limitations Sitting;Writing;Reading;House hold activities;Lifting;Standing;Walking    How long can you sit comfortably? Sits most of the day to work    How long can you stand comfortably? 30-60 minutes (was limited to a few minutes)    How long can you walk comfortably? 15 minutes on treadmill (was 100 feet)    Patient Stated Goals Decrease low back pain and endurance with standing and walking.    Currently in Pain? Yes    Pain Score 2     Pain Location Back    Pain Orientation Lower;Right    Pain Descriptors / Indicators Tightness;Sharp    Pain Type Chronic pain    Pain Radiating Towards No peripheral symptoms    Pain Onset More  than a month ago    Pain Frequency Intermittent    Aggravating Factors  Standing and walking without UE support    Pain Relieving Factors Change of position or exercises    Effect of Pain on Daily Activities Walking without UE support    Multiple Pain Sites No              [facility] PT Assessment - 01/30/21 0001      Observation/Other Assessments   Focus on Therapeutic Outcomes (FOTO)  56 (was 43, Goal 64)      ROM / Strength   AROM / PROM / Strength AROM;Strength      AROM   Overall AROM  Deficits    AROM Assessment Site Lumbar    Lumbar Extension 10 (was 5)      Strength   Overall Strength Deficits    Overall Strength Comments Unable to assume positions required for testing.  Subjectively and functionally, spine and leg strength have improved and will certainly benefit from continued work.                         [facility] Adult PT Treatment/Exercise - 01/30/21 0001      Posture/Postural Control   Posture/Postural Control Postural limitations    Postural Limitations Forward head;Rounded Shoulders;Decreased lumbar lordosis  Exercises   Exercises Lumbar      Lumbar Exercises: Machines for Strengthening   Leg Press Double leg 75#; single leg 31# slow eccentrics with all 10X each    Other Lumbar Machine Exercise Lat pull to chest in slight extension 25# 10X    Other Lumbar Machine Exercise Pull to chest 25# 10X      Lumbar Exercises: Standing   Shoulder ADduction Strengthening;Both;10 reps;Other (comment)    Shoulder Adduction Limitations 5 second hold (shoulder blade pinch)    Other Standing Lumbar Exercises Trunk extension AROM 10X 3 seconds, feet together balance on airex with head turns and nods 10x each    Other Standing Lumbar Exercises Hip hike and hip extension in standing Good posture 10X 3 seconds each      Lumbar Exercises: Seated   Sit to Stand 5 reps   2 sets.  Start sitting with SBP, stand then trunk extension at top before slow eccentric sit                  PT Education - 01/30/21 0927    Education Details Reviewed reassessment findings, reviewed emphasis of HEP, progressed clinic program.    Person(s) Educated Patient    Methods Explanation;Demonstration;Verbal cues    Comprehension Returned demonstration;Need further instruction;Verbal cues required;Verbalized understanding               PT Long Term Goals - 01/30/21 0928      PT LONG TERM GOAL #1   Title Improve FOTO to 64.    Baseline 56 (was 43)    Time 8    Period Weeks    Status On-going      PT LONG TERM GOAL #2   Title Improve trunk extension AROM to 10 degrees.    Baseline 0    Time 8    Period Weeks    Status Achieved      PT LONG TERM GOAL #3   Title Cody Perez will report low back pain consistently 0-3/10 on the Numeric Pain Rating Scale.    Baseline Can be 5+    Time 8    Period Weeks    Status On-going      PT LONG TERM GOAL #4   Title Cody Perez will be independent with his long-term HEP.    Time 8    Period Weeks    Status On-going                 Plan - 01/30/21 1639    Clinical Impression Statement Cody Perez is making very good overall progress with his physical therapy.  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  Leg strength is improving.  Cody Perez will certainly benefit from continued physical therapy as postural and leg weakness still limit his ability to walk without back pain.  He is able to walk 15 minutes on a treadmill due to the UE support.    Personal Factors and Comorbidities Comorbidity 1    Comorbidities R Quad rupture and repair 2015  Smoker  COPD  Dementia  Peripheral neuropathy  HTN    Examination-Activity Limitations Sit;Bed Mobility;Bend;Lift;Squat;Stairs;Stand    Examination-Participation Restrictions Occupation;Community Activity    Stability/Clinical Decision Making Stable/Uncomplicated    Rehab Potential Good    PT Frequency 2x / week    PT Duration 4 weeks    PT  Treatment/Interventions ADLs/Self Care Home Management;Moist Heat;Cryotherapy;Therapeutic activities;Therapeutic exercise;Neuromuscular re-education;Patient/family education;Manual techniques;Dry needling    PT Next Visit  Plan Postural and LE strength work    PT Home Exercise Plan [email or web address]    Consulted and Agree with Plan of Care Patient           Patient will benefit from skilled therapeutic intervention in order to improve the following deficits and impairments:  Decreased endurance,Decreased range of motion,Decreased strength,Difficulty walking,Improper body mechanics,Postural dysfunction,Pain  Visit Diagnosis: Abnormal posture  Difficulty walking  Muscle weakness (generalized)  Chronic bilateral low back pain without sciatica     Problem List Patient Active Problem List   Diagnosis Date Noted  . 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 Weakness of right leg 11/28/2019  . Unsteady gait when walking- due to Quad muscle rupture and repair 2015 11/28/2019  . Vitamin D insufficiency 05/11/2019  . Upper airway cough syndrome 11/08/2018  . Current smoker-   1/2 ppd for 4 yrs or so 11/03/2018  . HTN, goal below 140/90 04/15/2016  . Onychomycosis of toenail 11/30/2013  . MITRAL REGURGITATION- with heart murmur 05/18/2008  . 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 Hyperlipidemia 03/03/2008  . DEMENTIA 03/03/2008  . ANXIETY 03/03/2008  . 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 PERIPHERAL NEUROPATHY 03/03/2008  . COPD GOLD II active smoker 03/03/2008    Cherlyn Cushing PT, MPT 01/30/2021, 4:42 PM  [facility] Therapy [address] [place], [place], [address] Phone: [phone]   Fax:  [phone]  Name: Cody Perez MRN: [id number] Date of Birth: [birth year]

## 2021-01-30 NOTE — Patient Instructions (Signed)
Focus on trunk extension AROM, scapular adduction, hip hike, sit to stand and walking at home.

## 2021-02-04 ENCOUNTER — Encounter: Payer: Self-pay | Admitting: Physical Therapy

## 2021-02-04 ENCOUNTER — Ambulatory Visit (INDEPENDENT_AMBULATORY_CARE_PROVIDER_SITE_OTHER): Payer: Medicare Other | Admitting: Physical Therapy

## 2021-02-04 ENCOUNTER — Other Ambulatory Visit: Payer: Self-pay

## 2021-02-04 DIAGNOSIS — G8929 Other chronic pain: Secondary | ICD-10-CM

## 2021-02-04 DIAGNOSIS — R293 Abnormal posture: Secondary | ICD-10-CM | POA: Diagnosis not present

## 2021-02-04 DIAGNOSIS — M6281 Muscle weakness (generalized): Secondary | ICD-10-CM

## 2021-02-04 DIAGNOSIS — R262 Difficulty in walking, not elsewhere classified: Secondary | ICD-10-CM

## 2021-02-04 DIAGNOSIS — M545 Low back pain, unspecified: Secondary | ICD-10-CM | POA: Diagnosis not present

## 2021-02-04 DIAGNOSIS — R2689 Other abnormalities of gait and mobility: Secondary | ICD-10-CM

## 2021-02-04 NOTE — Therapy (Signed)
[facility] [address] [place], [place], [address] Phone: [phone]   Fax:  [phone]  Physical Therapy Treatment  Patient Details  Name: Cody Perez MRN: [id number] Date of Birth: 03-Oct-1951 Referring Provider (PT): Nadara Mustard MD   Encounter Date: 02/04/2021   PT End of Session - 02/04/21 0905    Visit Number 8    Number of Visits 16    Date for PT Re-Evaluation 04/15/21    PT Start Time 0845    PT Stop Time 0927    PT Time Calculation (min) 42 min    Activity Tolerance Patient tolerated treatment well;No increased pain;Patient limited by fatigue    Behavior During Therapy [facility] for tasks assessed/performed           Past Medical History:  Diagnosis Date  . Hyperlipidemia   . Hypertension     History reviewed. No pertinent surgical history.  There were no vitals filed for this visit.   Subjective Assessment - 02/04/21 0857    Subjective Cody Perez reports he is having 8/10 LBP today and he may have slept wrong because when he woke up this morning    Pertinent History R quadriceps rupture and repair 2015, smoker, COPD, dementia, peripheral neuropathy, HTN    Limitations Sitting;Writing;Reading;House hold activities;Lifting;Standing;Walking    How long can you sit comfortably? Sits most of the day to work    How long can you stand comfortably? 30-60 minutes (was limited to a few minutes)    How long can you walk comfortably? 15 minutes on treadmill (was 100 feet)    Patient Stated Goals Decrease low back pain and endurance with standing and walking.    Pain Onset More than a month ago                             [facility] Adult PT Treatment/Exercise - 02/04/21 0001      Lumbar Exercises: Aerobic   Nustep L5 x10 min UE/LE with heat to back      Lumbar Exercises: Machines for Strengthening   Cybex Knee Extension 10# 2 sets of 10    Leg Press Double leg 81# 2 sets of 10; single leg 31# slow eccentrics with 2 sets  of 10 each    Other Lumbar Machine Exercise Lat pull to chest in slight extension 25# 10X2 sets    Other Lumbar Machine Exercise Pull to chest 25# 10X2 sets      Lumbar Exercises: Standing   Other Standing Lumbar Exercises suitcase cary 5 lbs one lap ea side      Lumbar Exercises: Seated   Sit to Stand Limitations 2 sets of 5 with slow eccentric lower                       PT Long Term Goals - 01/30/21 0928      PT LONG TERM GOAL #1   Title Improve FOTO to 64.    Baseline 56 (was 43)    Time 8    Period Weeks    Status On-going      PT LONG TERM GOAL #2   Title Improve trunk extension AROM to 10 degrees.    Baseline 0    Time 8    Period Weeks    Status Achieved      PT LONG TERM GOAL #3   Title Cody Perez will report low back pain consistently 0-3/10 on  the Numeric Pain Rating Scale.    Baseline Can be 5+    Time 8    Period Weeks    Status On-going      PT LONG TERM GOAL #4   Title Cody Perez will be independent with his long-term HEP.    Time 8    Period Weeks    Status On-going                 Plan - 02/04/21 0925    Clinical Impression Statement He arrives with increased back pain but after sesison his back pain was much better and he had no complaints of pain during his program today. He will continue to benefit from strength and conditioning to achieve his functional goals.    Personal Factors and Comorbidities Comorbidity 1    Comorbidities R Quad rupture and repair 2015  Smoker  COPD  Dementia  Peripheral neuropathy  HTN    Examination-Activity Limitations Sit;Bed Mobility;Bend;Lift;Squat;Stairs;Stand    Examination-Participation Restrictions Occupation;Community Activity    Stability/Clinical Decision Making Stable/Uncomplicated    Rehab Potential Good    PT Frequency 2x / week    PT Duration 4 weeks    PT Treatment/Interventions ADLs/Self Care Home Management;Moist Heat;Cryotherapy;Therapeutic activities;Therapeutic exercise;Neuromuscular  re-education;Patient/family education;Manual techniques;Dry needling    PT Next Visit Plan Postural and LE strength work    PT Home Exercise Plan [email or web address]    Consulted and Agree with Plan of Care Patient           Patient will benefit from skilled therapeutic intervention in order to improve the following deficits and impairments:  Decreased endurance,Decreased range of motion,Decreased strength,Difficulty walking,Improper body mechanics,Postural dysfunction,Pain  Visit Diagnosis: Abnormal posture  Difficulty walking  Muscle weakness (generalized)  Chronic bilateral low back pain without sciatica  Other abnormalities of gait and mobility     Problem List Patient Active Problem List   Diagnosis Date Noted  . 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 Weakness of right leg 11/28/2019  . Unsteady gait when walking- due to Quad muscle rupture and repair 2015 11/28/2019  . Vitamin D insufficiency 05/11/2019  . Upper airway cough syndrome 11/08/2018  . Current smoker-   1/2 ppd for 4 yrs or so 11/03/2018  . HTN, goal below 140/90 04/15/2016  . Onychomycosis of toenail 11/30/2013  . MITRAL REGURGITATION- with heart murmur 05/18/2008  . 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 Hyperlipidemia 03/03/2008  . DEMENTIA 03/03/2008  . ANXIETY 03/03/2008  . 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 PERIPHERAL NEUROPATHY 03/03/2008  . COPD GOLD II active smoker 03/03/2008    Cody Perez, PT,DPT 02/04/2021, 9:27 AM  [facility] Therapy [address] [place], [place], [address] Phone: [phone]   Fax:  [phone]  Name: Cody Perez MRN: [id number] Date of Birth: 14-Jun-1951

## 2021-02-06 ENCOUNTER — Ambulatory Visit (INDEPENDENT_AMBULATORY_CARE_PROVIDER_SITE_OTHER): Payer: Medicare Other | Admitting: Rehabilitative and Restorative Service Providers"

## 2021-02-06 ENCOUNTER — Encounter: Payer: Self-pay | Admitting: Rehabilitative and Restorative Service Providers"

## 2021-02-06 ENCOUNTER — Other Ambulatory Visit: Payer: Self-pay

## 2021-02-06 DIAGNOSIS — M545 Low back pain, unspecified: Secondary | ICD-10-CM

## 2021-02-06 DIAGNOSIS — M6281 Muscle weakness (generalized): Secondary | ICD-10-CM

## 2021-02-06 DIAGNOSIS — R262 Difficulty in walking, not elsewhere classified: Secondary | ICD-10-CM

## 2021-02-06 DIAGNOSIS — R293 Abnormal posture: Secondary | ICD-10-CM

## 2021-02-06 DIAGNOSIS — G8929 Other chronic pain: Secondary | ICD-10-CM

## 2021-02-06 NOTE — Therapy (Addendum)
[facility] Physical Therapy [address] [place], [place], [address] Phone: [phone]   Fax:  [phone]  Physical Therapy Treatment  Patient Details  Name: Cody Perez MRN: [id number] Date of Birth: 04-14-1951 Referring Provider (PT): Newt Minion MD  PHYSICAL THERAPY DISCHARGE SUMMARY  Visits from Start of Care: 9  Current functional level related to goals / functional outcomes: See note   Remaining deficits: See note    Education / Equipment: HEP Plan:                                                    Patient goals were not met. Patient is being discharged due to not returning since the last visit.  ?????     Encounter Date: 02/06/2021   PT End of Session - 02/06/21 1117    Visit Number 9    Number of Visits 16    Date for PT Re-Evaluation 04/15/21    PT Start Time 0845    PT Stop Time 0925    PT Time Calculation (min) 40 min    Activity Tolerance Patient tolerated treatment well;No increased pain;Patient limited by fatigue    Behavior During Therapy [facility] for tasks assessed/performed           Past Medical History:  Diagnosis Date  . Hyperlipidemia   . Hypertension     History reviewed. No pertinent surgical history.  There were no vitals filed for this visit.   Subjective Assessment - 02/06/21 0851    Subjective Cody Perez reports more R sided belt line back pain this morning.  The rain might be a contributing factor.  He is up to 15 minutes on the treadmill.    Pertinent History R quadriceps rupture and repair 2015, smoker, COPD, dementia, peripheral neuropathy, HTN    Limitations Sitting;Writing;Reading;House hold activities;Lifting;Standing;Walking    How long can you sit comfortably? Sits most of the day to work    How long can you stand comfortably? 30-60 minutes (was limited to a few minutes)    How long can you walk comfortably? 15 minutes on treadmill (was 100 feet)    Patient Stated Goals Decrease low back pain and endurance  with standing and walking.    Currently in Pain? Yes    Pain Score 4     Pain Location Back    Pain Orientation Right;Lower    Pain Descriptors / Indicators Tightness;Sharp;Aching    Pain Type Chronic pain    Pain Radiating Towards No peripheral symptoms    Pain Onset More than a month ago    Pain Frequency Intermittent    Aggravating Factors  Standing and walking without UE support.    Pain Relieving Factors Change of position or exercises.    Effect of Pain on Daily Activities Walking or standing without UE support.    Multiple Pain Sites No                             [facility] Adult PT Treatment/Exercise - 02/06/21 0001      Posture/Postural Control   Posture/Postural Control Postural limitations    Postural Limitations Forward head;Rounded Shoulders;Decreased lumbar lordosis      Therapeutic Activites    Therapeutic Activities Other Therapeutic Activities    Other Therapeutic Activities Spent time discussing  how arthritis is affected by weather and pressure changes.  Discussed changes to his HEP on "bad" days (treadmill, lots of exetnsion and SBP)      Exercises   Exercises Lumbar      Lumbar Exercises: Stretches   Single Knee to Chest Stretch Left;Right;4 reps;20 seconds;Other (comment)   1 leg off table and other knee bent     Lumbar Exercises: Aerobic   Tread Mill 0.8 MPH for 4 minutes (postural emphasis)      Lumbar Exercises: Machines for Strengthening   Leg Press Double leg 87# 2 sets of 10; single leg 37# slow eccentrics with 2 sets of 10 each      Lumbar Exercises: Standing   Shoulder ADduction Strengthening;Both;10 reps;Other (comment)    Shoulder Adduction Limitations 5 second hold (shoulder blade pinches)    Other Standing Lumbar Exercises Trunk extension AROM 3 sets of 10 for 3 seconds    Other Standing Lumbar Exercises Hip hike 10X 3 seconds                  PT Education - 02/06/21 0927    Education Details Cody Perez was having a tough  day with R low back pain.  We discussed changes to make with his HEP on days where the weather (pressure changes related to rain and fronts) increases his back pain.  Reviewed log roll technique.    Person(s) Educated Patient    Methods Explanation;Demonstration;Tactile cues;Verbal cues    Comprehension Returned demonstration;Need further instruction;Verbalized understanding;Verbal cues required;Tactile cues required               PT Long Term Goals - 02/06/21 0929      PT LONG TERM GOAL #1   Title Improve FOTO to 64.    Baseline 56 (was 43)    Time 8    Period Weeks    Status On-going    Target Date 03/13/21      PT LONG TERM GOAL #2   Title Improve trunk extension AROM to 10 degrees.    Baseline 0    Time 8    Period Weeks    Status On-going    Target Date 03/13/21      PT LONG TERM GOAL #3   Title Cody Perez will report low back pain consistently 0-3/10 on the Numeric Pain Rating Scale.    Baseline Can be 5+    Time 8    Period Weeks    Status On-going    Target Date 03/13/21      PT LONG TERM GOAL #4   Title Cody Perez will be independent with his long-term HEP.    Time 8    Period Weeks    Status On-going    Target Date 03/13/21                 Plan - 02/06/21 1117    Clinical Impression Statement Like several other people with arthritis mentioned today, Cody Perez reports low back pain has been worse the past 1-2 days with the rain and front coming through.  Postural strength and awareness remain high priorities with Cody Perez's HEP.  We had to hold on some strengthening today due to muscle guarding and pain and instead reviewed body mechanics like log roll technique for getting in and out of bed and modifications for his HEP on days where pain is higher.  Continue postural correction. leg and core strength emphasis to meet long-term goals.    Personal Factors and Comorbidities Comorbidity  1    Comorbidities R Quad rupture and repair 2015  Smoker  COPD  Dementia  Peripheral  neuropathy  HTN    Examination-Activity Limitations Sit;Bed Mobility;Bend;Lift;Squat;Stairs;Stand    Examination-Participation Restrictions Occupation;Community Activity    Stability/Clinical Decision Making Stable/Uncomplicated    Rehab Potential Good    PT Frequency 2x / week    PT Duration 4 weeks    PT Treatment/Interventions ADLs/Self Care Home Management;Moist Heat;Cryotherapy;Therapeutic activities;Therapeutic exercise;Neuromuscular re-education;Patient/family education;Manual techniques;Dry needling    PT Next Visit Plan Postural and LE strength work    PT Home Exercise Plan [id number]    Consulted and Agree with Plan of Care Patient           Patient will benefit from skilled therapeutic intervention in order to improve the following deficits and impairments:  Decreased endurance,Decreased range of motion,Decreased strength,Difficulty walking,Improper body mechanics,Postural dysfunction,Pain  Visit Diagnosis: Abnormal posture  Difficulty walking  Muscle weakness (generalized)  Chronic bilateral low back pain without sciatica     Problem List Patient Active Problem List   Diagnosis Date Noted  . 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 Weakness of right leg 11/28/2019  . Unsteady gait when walking- due to Quad muscle rupture and repair 2015 11/28/2019  . Vitamin D insufficiency 05/11/2019  . Upper airway cough syndrome 11/08/2018  . Current smoker-   1/2 ppd for 4 yrs or so 11/03/2018  . HTN, goal below 140/90 04/15/2016  . Onychomycosis of toenail 11/30/2013  . MITRAL REGURGITATION- with heart murmur 05/18/2008  . 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 Hyperlipidemia 03/03/2008  . DEMENTIA 03/03/2008  . ANXIETY 03/03/2008  . Major depressive disorder, single episode with psychotic features ([place])- txed by psychiatry 03/03/2008  . PERIPHERAL NEUROPATHY 03/03/2008  . COPD GOLD II active smoker  03/03/2008    Farley Ly PT, MPT 02/06/2021, 11:21 AM  [facility] Physical Therapy [address] [place], [place], [address] Phone: [phone]   Fax:  [phone]  Name: Cody Perez MRN: [id number] Date of Birth: 1951-08-07

## 2021-02-16 ENCOUNTER — Other Ambulatory Visit: Payer: Self-pay | Admitting: Physician Assistant

## 2021-02-16 DIAGNOSIS — I10 Essential (primary) hypertension: Secondary | ICD-10-CM

## 2021-02-19 DIAGNOSIS — F3176 Bipolar disorder, in full remission, most recent episode depressed: Secondary | ICD-10-CM | POA: Diagnosis not present

## 2021-02-19 DIAGNOSIS — F411 Generalized anxiety disorder: Secondary | ICD-10-CM | POA: Diagnosis not present

## 2021-04-29 ENCOUNTER — Other Ambulatory Visit: Payer: Self-pay | Admitting: Physician Assistant

## 2021-04-29 DIAGNOSIS — E785 Hyperlipidemia, unspecified: Secondary | ICD-10-CM

## 2021-04-29 DIAGNOSIS — Z0000 Encounter for general adult medical examination without abnormal findings: Secondary | ICD-10-CM

## 2021-04-29 DIAGNOSIS — E559 Vitamin D deficiency, unspecified: Secondary | ICD-10-CM

## 2021-04-29 DIAGNOSIS — E7800 Pure hypercholesterolemia, unspecified: Secondary | ICD-10-CM

## 2021-04-29 DIAGNOSIS — I10 Essential (primary) hypertension: Secondary | ICD-10-CM

## 2021-05-02 ENCOUNTER — Other Ambulatory Visit: Payer: Self-pay

## 2021-05-02 ENCOUNTER — Other Ambulatory Visit: Payer: Medicare Other

## 2021-05-02 DIAGNOSIS — Z0000 Encounter for general adult medical examination without abnormal findings: Secondary | ICD-10-CM | POA: Diagnosis not present

## 2021-05-02 DIAGNOSIS — E785 Hyperlipidemia, unspecified: Secondary | ICD-10-CM | POA: Diagnosis not present

## 2021-05-02 DIAGNOSIS — E559 Vitamin D deficiency, unspecified: Secondary | ICD-10-CM

## 2021-05-02 DIAGNOSIS — I10 Essential (primary) hypertension: Secondary | ICD-10-CM

## 2021-05-02 DIAGNOSIS — E7800 Pure hypercholesterolemia, unspecified: Secondary | ICD-10-CM

## 2021-05-03 LAB — CBC
Hematocrit: 36.4 % — ABNORMAL LOW (ref 37.5–51.0)
Hemoglobin: 12.5 g/dL — ABNORMAL LOW (ref 13.0–17.7)
MCH: 34.6 pg — ABNORMAL HIGH (ref 26.6–33.0)
MCHC: 34.3 g/dL (ref 31.5–35.7)
MCV: 101 fL — ABNORMAL HIGH (ref 79–97)
Platelets: 213 10*3/uL (ref 150–450)
RBC: 3.61 x10E6/uL — ABNORMAL LOW (ref 4.14–5.80)
RDW: 11.8 % (ref 11.6–15.4)
WBC: 5.9 10*3/uL (ref 3.4–10.8)

## 2021-05-03 LAB — COMPREHENSIVE METABOLIC PANEL
ALT: 24 IU/L (ref 0–44)
AST: 41 IU/L — ABNORMAL HIGH (ref 0–40)
Albumin/Globulin Ratio: 1.2 (ref 1.2–2.2)
Albumin: 3.7 g/dL (ref 3.7–4.7)
Alkaline Phosphatase: 155 IU/L — ABNORMAL HIGH (ref 44–121)
BUN/Creatinine Ratio: 8 — ABNORMAL LOW (ref 10–24)
BUN: 7 mg/dL — ABNORMAL LOW (ref 8–27)
Bilirubin Total: 0.4 mg/dL (ref 0.0–1.2)
CO2: 23 mmol/L (ref 20–29)
Calcium: 9.7 mg/dL (ref 8.6–10.2)
Chloride: 92 mmol/L — ABNORMAL LOW (ref 96–106)
Creatinine, Ser: 0.92 mg/dL (ref 0.76–1.27)
Globulin, Total: 3 g/dL (ref 1.5–4.5)
Glucose: 110 mg/dL — ABNORMAL HIGH (ref 65–99)
Potassium: 4.4 mmol/L (ref 3.5–5.2)
Sodium: 132 mmol/L — ABNORMAL LOW (ref 134–144)
Total Protein: 6.7 g/dL (ref 6.0–8.5)
eGFR: 88 mL/min/{1.73_m2} (ref >59)

## 2021-05-03 LAB — LIPID PANEL
Chol/HDL Ratio: 1.7 ratio (ref 0.0–5.0)
Cholesterol, Total: 163 mg/dL (ref 100–199)
HDL: 98 mg/dL (ref >39)
LDL Chol Calc (NIH): 51 mg/dL (ref 0–99)
Triglycerides: 75 mg/dL (ref 0–149)
VLDL Cholesterol Cal: 14 mg/dL (ref 5–40)

## 2021-05-03 LAB — VITAMIN D 25 HYDROXY (VIT D DEFICIENCY, FRACTURES): Vit D, 25-Hydroxy: 45.1 ng/mL (ref 30.0–100.0)

## 2021-05-03 LAB — TSH: TSH: 0.949 u[IU]/mL (ref 0.450–4.500)

## 2021-05-09 ENCOUNTER — Ambulatory Visit (INDEPENDENT_AMBULATORY_CARE_PROVIDER_SITE_OTHER): Payer: Medicare Other | Admitting: Physician Assistant

## 2021-05-09 ENCOUNTER — Encounter: Payer: Self-pay | Admitting: Physician Assistant

## 2021-05-09 VITALS — BP 134/84 | HR 87 | Ht 70.0 in | Wt 180.0 lb

## 2021-05-09 DIAGNOSIS — D649 Anemia, unspecified: Secondary | ICD-10-CM | POA: Diagnosis not present

## 2021-05-09 DIAGNOSIS — Z0000 Encounter for general adult medical examination without abnormal findings: Secondary | ICD-10-CM

## 2021-05-09 DIAGNOSIS — F17200 Nicotine dependence, unspecified, uncomplicated: Secondary | ICD-10-CM

## 2021-05-09 NOTE — Progress Notes (Signed)
Virtual Visit via Telephone Note:  I connected with Cody Perez by telephone and verified that I am speaking with the correct person using two identifiers.    I discussed the limitations, risks, security and privacy concerns for performing an evaluation and management service by telephone and the availability of in person appointments. The staff discussed with patient that there may be a patient responsible charge related to this service. The patient expressed understanding and agreed to proceed.   Location of Patient- Home Location of Provider- Office   Subjective:   Cody Perez is a 70 y.o. male who presents for Medicare Annual/Subsequent preventive examination.  Review of Systems    General:   No F/C, wt loss Pulm:   No DIB, SOB, pleuritic chest pain Card:  No CP, palpitations Abd:  No n/v/d or pain Ext:  No inc edema from baseline     Objective:    Today's Vitals   05/09/21 0840  BP: 134/84  Pulse: 87  Weight: 180 lb (81.6 kg)  Height: 5\' 10"  (1.778 m)   Body mass index is 25.83 kg/m.  Advanced Directives 04/02/2020 04/09/2016  Does Patient Have a Medical Advance Directive? No No  Would patient like information on creating a medical advance directive? No - Patient declined -    Current Medications (verified) Outpatient Encounter Medications as of 05/09/2021  Medication Sig  . ABILIFY 10 MG tablet Take 5 mg by mouth daily.   05/11/2021 aspirin 325 MG tablet Take 325 mg by mouth daily.  . B Complex-C-Folic Acid (STRESS B COMPLEX PO) Take 1 tablet by mouth daily.  . clonazePAM (KLONOPIN) 1 MG tablet Take 1 mg by mouth 2 (two) times daily.  Marland Kitchen donepezil (ARICEPT) 10 MG tablet 1 daily (Patient taking differently: Take 10 mg by mouth at bedtime. 1 daily)  . ibuprofen (ADVIL) 600 MG tablet Take 1 tablet (600 mg total) by mouth every 8 (eight) hours as needed for moderate pain.  Marland Kitchen irbesartan (AVAPRO) 150 MG tablet TAKE 1 TABLET BY MOUTH EVERY DAY  . lamoTRIgine (LAMICTAL) 200 MG  tablet Take 200 mg by mouth daily.   . Multiple Vitamin (MULTIVITAMIN) capsule Take 1 capsule by mouth daily.  . QUEtiapine (SEROQUEL) 300 MG tablet Take 300 mg by mouth at bedtime.   . risperiDONE (RISPERDAL) 3 MG tablet Take 3 mg by mouth daily.   . simvastatin (ZOCOR) 10 MG tablet Take 1 tablet (10 mg total) by mouth daily at 6 PM. TAKE 1 TABLET BY MOUTH EVERYDAY AT BEDTIME   No facility-administered encounter medications on file as of 05/09/2021.    Allergies (verified) Patient has no known allergies.   History: Past Medical History:  Diagnosis Date  . Hyperlipidemia   . Hypertension    History reviewed. No pertinent surgical history. Family History  Adopted: Yes   Social History   Socioeconomic History  . Marital status: Married    Spouse name: Not on file  . Number of children: Not on file  . Years of education: Not on file  . Highest education level: Not on file  Occupational History  . Not on file  Tobacco Use  . Smoking status: Current Every Day Smoker    Packs/day: 0.50    Years: 10.00    Pack years: 5.00    Types: Cigarettes  . Smokeless tobacco: Never Used  Vaping Use  . Vaping Use: Never used  Substance and Sexual Activity  . Alcohol use: Yes  Alcohol/week: 2.0 standard drinks    Types: 2 Standard drinks or equivalent per week  . Drug use: Never  . Sexual activity: Not Currently  Other Topics Concern  . Not on file  Social History Narrative  . Not on file   Social Determinants of Health   Financial Resource Strain: Not on file  Food Insecurity: Not on file  Transportation Needs: Not on file  Physical Activity: Not on file  Stress: Not on file  Social Connections: Not on file    Tobacco Counseling Ready to quit: Not Answered Counseling given: Not Answered   Diabetic? No     Activities of Daily Living In your present state of health, do you have any difficulty performing the following activities: 05/09/2021 12/31/2020  Hearing? N N   Vision? N N  Difficulty concentrating or making decisions? N N  Walking or climbing stairs? Y Y  Dressing or bathing? N N  Doing errands, shopping? N N  Some recent data might be hidden    Patient Care Team: Mayer Masker, PA-C as PCP - General Nyoka Cowden, MD as Consulting Physician (Pulmonary Disease) Deatra Robinson, NP (Nurse Practitioner) Deatra Robinson, NP as Physician Assistant (Nurse Practitioner) Nadara Mustard, MD as Consulting Physician (Orthopedic Surgery)  Indicate any recent Medical Services you may have received from other than [facility] providers in the past year (date may be approximate).     Assessment:   This is a routine wellness examination for [facility].  Hearing/Vision screen No exam data present  Dietary issues and exercise activities discussed: -Stays active with treadmill and walking. Follows a low fat diet. Recommend to stay well hydrated.  Goals Addressed   None    Depression Screen PHQ 2/9 Scores 05/09/2021 12/31/2020 11/13/2020 04/02/2020 11/28/2019 05/11/2019 11/03/2018  PHQ - 2 Score 0 0 2 0 0 0 0  PHQ- 9 Score 0 0 5 0 1 0 0    Fall Risk Fall Risk  05/09/2021 12/31/2020 11/13/2020 11/28/2019 11/28/2019  Falls in the past year? 1 0 0 0 0  Number falls in past yr: 0 - - 0 0  Injury with Fall? 1 - - 0 0  Risk for fall due to : History of fall(s) - - - -  Follow up Falls evaluation completed Falls evaluation completed Falls evaluation completed Falls evaluation completed Falls evaluation completed    FALL RISK PREVENTION PERTAINING TO THE HOME:  Any stairs in or around the home? Yes  If so, are there any without handrails? Yes  Home free of loose throw rugs in walkways, pet beds, electrical cords, etc? Yes  Adequate lighting in your home to reduce risk of falls? Yes   ASSISTIVE DEVICES UTILIZED TO PREVENT FALLS:  Life alert? No  Use of a cane, walker or w/c? No  Grab bars in the bathroom? No  Shower chair or bench in shower? No  Elevated toilet  seat or a handicapped toilet? No   TIMED UP AND GO:  Was the test performed? No .  Length of time to ambulate 10 feet:  sec.   Telehealth   Cognitive Function: MMSE - Mini Mental State Exam 04/02/2020  Orientation to time 3  Orientation to Place 5  Registration 3  Attention/ Calculation 5  Recall 3  Language- name 2 objects 2  Language- repeat 1  Language- follow 3 step command 2  Language- read & follow direction 1  Write a sentence 1  Copy design 0  Total score 26  6CIT Screen 05/09/2021  What Year? 0 points  What month? 0 points  What time? 0 points  Count back from 20 0 points  Months in reverse 0 points  Repeat phrase 0 points  Total Score 0    Immunizations Immunization History  Administered Date(s) Administered  . Influenza Split 10/24/2012  . Influenza Whole 10/17/2010  . Influenza, High Dose Seasonal PF 12/20/2014  . Influenza,inj,Quad PF,6+ Mos 11/30/2013  . PFIZER(Purple Top)SARS-COV-2 Vaccination 01/21/2020, 02/15/2020  . Pneumococcal Conjugate-13 05/05/2017  . Pneumococcal Polysaccharide-23 12/20/2014  . Td 06/11/2009  . Zoster Recombinat (Shingrix) 05/05/2017, 10/26/2017    TDAP status: Due, Education has been provided regarding the importance of this vaccine. Advised may receive this vaccine at local pharmacy or Health Dept. Aware to provide a copy of the vaccination record if obtained from local pharmacy or Health Dept. Verbalized acceptance and understanding.  Flu Vaccine status: Up to date  Pneumococcal vaccine status: Up to date  Covid-19 vaccine status: Completed vaccines  Qualifies for Shingles Vaccine? Yes   Zostavax completed Yes   Shingrix Completed?: Yes  Screening Tests Health Maintenance  Topic Date Due  . Hepatitis C Screening  Never done  . COLONOSCOPY (Pts 45-60yrs Insurance coverage will need to be confirmed)  06/20/2017  . TETANUS/TDAP  06/12/2019  . COVID-19 Vaccine (3 - Booster for Pfizer series) 07/17/2020  .  INFLUENZA VACCINE  07/14/2021  . PNA vac Low Risk Adult  Completed  . Zoster Vaccines- Shingrix  Completed  . HPV VACCINES  Aged Out    Health Maintenance  Health Maintenance Due  Topic Date Due  . Hepatitis C Screening  Never done  . COLONOSCOPY (Pts 45-66yrs Insurance coverage will need to be confirmed)  06/20/2017  . TETANUS/TDAP  06/12/2019  . COVID-19 Vaccine (3 - Booster for Pfizer series) 07/17/2020    Colorectal cancer screening: Referral to GI placed PT DECLINED. Pt aware the office will call re: appt.  Lung Cancer Screening: (Low Dose CT Chest recommended if Age 22-80 years, 30 pack-year currently smoking OR have quit w/in 15years.) does qualify.   Lung Cancer Screening Referral: Placed  Additional Screening:  Hepatitis C Screening: does qualify; Completed PT DECLINED  Vision Screening: Recommended annual ophthalmology exams for early detection of glaucoma and other disorders of the eye. Is the patient up to date with their annual eye exam?  No  Who is the provider or what is the name of the office in which the patient attends annual eye exams? Dr. Sharlot Gowda If pt is not established with a provider, would they like to be referred to a provider to establish care? No .   Dental Screening: Recommended annual dental exams for proper oral hygiene  Community Resource Referral / Chronic Care Management: CRR required this visit?  No   CCM required this visit?  No      Plan:  -Patient reports fall from missing a step going downstairs and sprained left ankle 2 weeks ago. No other falls. Recommend RICE therapy. -Discussed most recent lab results, most are essentially within normal limits or stable from prior with the exception of CBC and CMP. CBC suggestive of anemia. Recommend repeating CBC w/diff and also collect iron and B12 panel to evaluate for possible etiology in 2 weeks. -Recommend tobacco cessation. -Follow up in 4 months for HTN, HLD, mood  I have personally reviewed  and noted the following in the patient's chart:   . Medical and social history . Use of alcohol, tobacco or  illicit drugs  . Current medications and supplements including opioid prescriptions. Patient is not currently taking opioid prescriptions. . Functional ability and status . Nutritional status . Physical activity . Advanced directives . List of other physicians . Hospitalizations, surgeries, and ER visits in previous 12 months . Vitals . Screenings to include cognitive, depression, and falls . Referrals and appointments  In addition, I have reviewed and discussed with patient certain preventive protocols, quality metrics, and best practice recommendations. A written personalized care plan for preventive services as well as general preventive health recommendations were provided to patient.

## 2021-05-18 ENCOUNTER — Other Ambulatory Visit: Payer: Self-pay | Admitting: Physician Assistant

## 2021-05-18 DIAGNOSIS — E7800 Pure hypercholesterolemia, unspecified: Secondary | ICD-10-CM

## 2021-05-18 DIAGNOSIS — I10 Essential (primary) hypertension: Secondary | ICD-10-CM

## 2021-05-23 ENCOUNTER — Other Ambulatory Visit: Payer: Medicare Other

## 2021-05-23 ENCOUNTER — Other Ambulatory Visit: Payer: Self-pay

## 2021-05-23 DIAGNOSIS — Z0000 Encounter for general adult medical examination without abnormal findings: Secondary | ICD-10-CM

## 2021-05-23 DIAGNOSIS — D649 Anemia, unspecified: Secondary | ICD-10-CM | POA: Diagnosis not present

## 2021-05-23 DIAGNOSIS — D539 Nutritional anemia, unspecified: Secondary | ICD-10-CM

## 2021-05-24 LAB — CBC WITH DIFFERENTIAL/PLATELET
Basophils Absolute: 0 10*3/uL (ref 0.0–0.2)
Basos: 1 %
EOS (ABSOLUTE): 0.1 10*3/uL (ref 0.0–0.4)
Eos: 1 %
Hematocrit: 38.7 % (ref 37.5–51.0)
Hemoglobin: 13.4 g/dL (ref 13.0–17.7)
Immature Grans (Abs): 0 10*3/uL (ref 0.0–0.1)
Immature Granulocytes: 0 %
Lymphocytes Absolute: 3.9 10*3/uL — ABNORMAL HIGH (ref 0.7–3.1)
Lymphs: 58 %
MCH: 34.2 pg — ABNORMAL HIGH (ref 26.6–33.0)
MCHC: 34.6 g/dL (ref 31.5–35.7)
MCV: 99 fL — ABNORMAL HIGH (ref 79–97)
Monocytes Absolute: 0.5 10*3/uL (ref 0.1–0.9)
Monocytes: 7 %
Neutrophils Absolute: 2.2 10*3/uL (ref 1.4–7.0)
Neutrophils: 33 %
Platelets: 193 10*3/uL (ref 150–450)
RBC: 3.92 x10E6/uL — ABNORMAL LOW (ref 4.14–5.80)
RDW: 12.8 % (ref 11.6–15.4)
WBC: 6.6 10*3/uL (ref 3.4–10.8)

## 2021-05-24 LAB — B12 AND FOLATE PANEL
Folate: >20 ng/mL (ref >3.0)
Vitamin B-12: >2000 pg/mL — ABNORMAL HIGH (ref 232–1245)

## 2021-05-24 LAB — IRON,TIBC AND FERRITIN PANEL
Ferritin: 136 ng/mL (ref 30–400)
Iron Saturation: 39 % (ref 15–55)
Iron: 104 ug/dL (ref 38–169)
Total Iron Binding Capacity: 266 ug/dL (ref 250–450)
UIBC: 162 ug/dL (ref 111–343)

## 2021-06-04 DIAGNOSIS — F3176 Bipolar disorder, in full remission, most recent episode depressed: Secondary | ICD-10-CM | POA: Diagnosis not present

## 2021-06-04 DIAGNOSIS — F411 Generalized anxiety disorder: Secondary | ICD-10-CM | POA: Diagnosis not present

## 2021-08-17 ENCOUNTER — Other Ambulatory Visit: Payer: Self-pay | Admitting: Physician Assistant

## 2021-08-17 DIAGNOSIS — I10 Essential (primary) hypertension: Secondary | ICD-10-CM

## 2021-09-03 DIAGNOSIS — F411 Generalized anxiety disorder: Secondary | ICD-10-CM | POA: Diagnosis not present

## 2021-09-03 DIAGNOSIS — F3176 Bipolar disorder, in full remission, most recent episode depressed: Secondary | ICD-10-CM | POA: Diagnosis not present

## 2021-09-23 ENCOUNTER — Other Ambulatory Visit: Payer: Self-pay | Admitting: Physician Assistant

## 2021-09-23 DIAGNOSIS — E7800 Pure hypercholesterolemia, unspecified: Secondary | ICD-10-CM

## 2021-10-08 DIAGNOSIS — Z23 Encounter for immunization: Secondary | ICD-10-CM | POA: Diagnosis not present

## 2021-10-20 ENCOUNTER — Other Ambulatory Visit: Payer: Self-pay | Admitting: Physician Assistant

## 2021-10-20 DIAGNOSIS — E7800 Pure hypercholesterolemia, unspecified: Secondary | ICD-10-CM

## 2021-10-30 ENCOUNTER — Ambulatory Visit (INDEPENDENT_AMBULATORY_CARE_PROVIDER_SITE_OTHER): Payer: Medicare Other | Admitting: Physician Assistant

## 2021-10-30 ENCOUNTER — Encounter: Payer: Self-pay | Admitting: Physician Assistant

## 2021-10-30 ENCOUNTER — Other Ambulatory Visit: Payer: Self-pay

## 2021-10-30 VITALS — BP 178/80 | HR 98 | Temp 97.9°F | Ht 70.0 in | Wt 174.0 lb

## 2021-10-30 DIAGNOSIS — E559 Vitamin D deficiency, unspecified: Secondary | ICD-10-CM | POA: Diagnosis not present

## 2021-10-30 DIAGNOSIS — F323 Major depressive disorder, single episode, severe with psychotic features: Secondary | ICD-10-CM

## 2021-10-30 DIAGNOSIS — E7800 Pure hypercholesterolemia, unspecified: Secondary | ICD-10-CM | POA: Diagnosis not present

## 2021-10-30 DIAGNOSIS — D649 Anemia, unspecified: Secondary | ICD-10-CM

## 2021-10-30 DIAGNOSIS — Z716 Tobacco abuse counseling: Secondary | ICD-10-CM

## 2021-10-30 DIAGNOSIS — I10 Essential (primary) hypertension: Secondary | ICD-10-CM | POA: Diagnosis not present

## 2021-10-30 MED ORDER — SIMVASTATIN 10 MG PO TABS: ORAL_TABLET | ORAL | 1 refills | Status: DC

## 2021-10-30 NOTE — Assessment & Plan Note (Signed)
-  Last Vitamin D 45.1, wnl's. Will repeat Vitamin D today.

## 2021-10-30 NOTE — Assessment & Plan Note (Addendum)
-  BP elevated in office today. BP rechecked and did not improve. Will reassess BP at follow up visit and also advised to check BP/pulse daily x 2 weeks and keep a log to forward to the office to evaluate for medication adjustments. Will continue Irbesartan 150 mg. Continue with low sodium diet. -Will collect CMP for medication monitoring.  -Will continue to monitor.

## 2021-10-30 NOTE — Progress Notes (Signed)
Established Patient Office Visit  Subjective:  Patient ID: Cody Perez, male    DOB: [birth year]  Age: 70 y.o. MRN: [id number]  CC:  Chief Complaint  Patient presents with   Follow-up   Medication Refill    HPI Cody Perez presents for follow up on hypertension and hyperlipidemia.  HTN: Pt denies chest pain, palpitations, dizziness or leg swelling. Taking medication as directed without side effects. Checks BP at home  and readings range 122/80s. Pt follows a low salt diet. Drinks 8 cups of water daily.  HLD: Pt taking medication as directed without issues. Denies side effects including myalgias, arthralgias and RUQ pain. Reports follows a well-balanced diet and rarely eats red meat or fried foods.  Mood: Followed by Psychiatry. Reports medication compliance. No SI/HI.   Tobacco use: 10 cig/day, has reduced from 1 PPD. Reports it is hard to completely quit, smoking is a habit and coping mechanism for stress.    Past Medical History:  Diagnosis Date   Hyperlipidemia    Hypertension     History reviewed. No pertinent surgical history.  Family History  Adopted: Yes    Social History   Socioeconomic History   Marital status: Married    Spouse name: Not on file   Number of children: Not on file   Years of education: Not on file   Highest education level: Not on file  Occupational History   Not on file  Tobacco Use   Smoking status: Every Day    Packs/day: 0.50    Years: 10.00    Pack years: 5.00    Types: Cigarettes   Smokeless tobacco: Never  Vaping Use   Vaping Use: Never used  Substance and Sexual Activity   Alcohol use: Yes    Alcohol/week: 2.0 standard drinks    Types: 2 Standard drinks or equivalent per week   Drug use: Never   Sexual activity: Not Currently  Other Topics Concern   Not on file  Social History Narrative   Not on file   Social Determinants of Health   Financial Resource Strain: Not on file  Food Insecurity: Not on file   Transportation Needs: Not on file  Physical Activity: Not on file  Stress: Not on file  Social Connections: Not on file  Intimate Partner Violence: Not on file    Outpatient Medications Prior to Visit  Medication Sig Dispense Refill   ABILIFY 10 MG tablet Take 5 mg by mouth daily.      aspirin 325 MG tablet Take 325 mg by mouth daily.     B Complex-C-Folic Acid (STRESS B COMPLEX PO) Take 1 tablet by mouth daily.     clonazePAM (KLONOPIN) 1 MG tablet Take 1 mg by mouth 2 (two) times daily.     donepezil (ARICEPT) 10 MG tablet 1 daily (Patient taking differently: Take 10 mg by mouth at bedtime. 1 daily) 100 tablet 3   ibuprofen (ADVIL) 600 MG tablet Take 1 tablet (600 mg total) by mouth every 8 (eight) hours as needed for moderate pain. 10 tablet 0   irbesartan (AVAPRO) 150 MG tablet TAKE 1 TABLET BY MOUTH EVERY DAY 90 tablet 0   lamoTRIgine (LAMICTAL) 200 MG tablet Take 200 mg by mouth daily.      Multiple Vitamin (MULTIVITAMIN) capsule Take 1 capsule by mouth daily.     QUEtiapine (SEROQUEL) 300 MG tablet Take 300 mg by mouth at bedtime.      risperiDONE (RISPERDAL) 3 MG tablet  Take 3 mg by mouth daily.      simvastatin (ZOCOR) 10 MG tablet TAKE 1 TABLET (10 MG TOTAL) BY MOUTH DAILY AT 6 PM. NEED APPT FOR REFILLS 15 tablet 0   No facility-administered medications prior to visit.    No Known Allergies  ROS Review of Systems Review of Systems:  A fourteen system review of systems was performed and found to be positive as per HPI.   Objective:    Physical Exam General:  Pleasant and cooperative, in no acute distress  Neuro:  Alert and oriented,  extra-ocular muscles intact  HEENT:  Normocephalic, atraumatic, neck supple Skin:  no gross rash, warm, pink. Cardiac:  RRR, +murmur  Respiratory: Mild rhonchi at lung bases, no crackles or rales. Not using accessory muscles, speaking in full sentences- unlabored. Vascular:  Ext warm, no cyanosis apprec.; cap RF less 2 sec. No edema   Psych:  No HI/SI, judgement and insight good, Euthymic mood. Full Affect.  BP (!) 178/80   Pulse 98   Temp 97.9 F (36.6 C)   Ht '5\' 10"'  (1.778 m)   Wt 174 lb (78.9 kg)   SpO2 97%   BMI 24.97 kg/m  Wt Readings from Last 3 Encounters:  10/30/21 174 lb (78.9 kg)  05/09/21 180 lb (81.6 kg)  12/31/20 180 lb (81.6 kg)     Health Maintenance Due  Topic Date Due   Hepatitis C Screening  Never done   COLONOSCOPY (Pts 45-45yr Insurance coverage will need to be confirmed)  06/20/2017   TETANUS/TDAP  06/12/2019   COVID-19 Vaccine (3 - Pfizer risk series) 03/14/2020   INFLUENZA VACCINE  07/14/2021    There are no preventive care reminders to display for this patient.  Lab Results  Component Value Date   TSH 0.949 05/02/2021   Lab Results  Component Value Date   WBC 6.6 05/23/2021   HGB 13.4 05/23/2021   HCT 38.7 05/23/2021   MCV 99 (H) 05/23/2021   PLT 193 05/23/2021   Lab Results  Component Value Date   NA 132 (L) 05/02/2021   K 4.4 05/02/2021   CO2 23 05/02/2021   GLUCOSE 110 (H) 05/02/2021   BUN 7 (L) 05/02/2021   CREATININE 0.92 05/02/2021   BILITOT 0.4 05/02/2021   ALKPHOS 155 (H) 05/02/2021   AST 41 (H) 05/02/2021   ALT 24 05/02/2021   PROT 6.7 05/02/2021   ALBUMIN 3.7 05/02/2021   CALCIUM 9.7 05/02/2021   ANIONGAP 11 04/02/2020   EGFR 88 05/02/2021   GFR 81.46 09/20/2018   Lab Results  Component Value Date   CHOL 163 05/02/2021   Lab Results  Component Value Date   HDL 98 05/02/2021   Lab Results  Component Value Date   LDLCALC 51 05/02/2021   Lab Results  Component Value Date   TRIG 75 05/02/2021   Lab Results  Component Value Date   CHOLHDL 1.7 05/02/2021   Lab Results  Component Value Date   HGBA1C 5.3 05/09/2019   [facility] 10/30/2021 05/09/2021 12/31/2020 11/13/2020 04/02/2020  Decreased Interest 0 0 0 2 0  Down, Depressed, Hopeless 0 0 0 0 0  PHQ - 2 Score 0 0 0 2 0  Altered sleeping 0 0 0 2 0  Tired, decreased  energy 0 0 0 0 0  Change in appetite - 0 0 1 0  Feeling bad or failure about yourself  1 0 0 0 0  Trouble concentrating 0 0 0 0  0  Moving slowly or fidgety/restless 0 0 0 0 0  Suicidal thoughts 0 0 0 0 0  PHQ-9 Score 1 0 0 5 0  Difficult doing work/chores Somewhat difficult Not difficult at all - - -  Some recent data might be hidden   GAD 7 : Generalized Anxiety Score 10/30/2021 05/11/2019  Nervous, Anxious, on Edge 0 0  Control/stop worrying 0 0  Worry too much - different things 0 0  Trouble relaxing 0 0  Restless 0 0  Easily annoyed or irritable 0 0  Afraid - awful might happen 0 0  Total GAD 7 Score 0 0  Anxiety Difficulty Not difficult at all Not difficult at all        Assessment & Plan:   Problem List Items Addressed This Visit       Cardiovascular and Mediastinum   HTN, goal below 140/90 - Primary    -BP elevated in office today. BP rechecked and did not improve. Will reassess BP at follow up visit and also advised to check BP/pulse daily x 2 weeks and keep a log to forward to the office to evaluate for medication adjustments. Will continue Irbesartan 150 mg. Continue with low sodium diet. -Will collect CMP for medication monitoring.  -Will continue to monitor.      Relevant Medications   simvastatin (ZOCOR) 10 MG tablet   Other Relevant Orders   Comp Met (CMET)     Other   Hyperlipidemia (Chronic)    -Last lipid panel: total cholesterol 163, triglycerides 75, HDL 98, LDL 51 (at goal <70). -Continue current medication regimen. Will repeat lipid panel and hepatic function. -Recommend to continue with low fat diet and stay as active as possible. -Will continue to monitor.      Relevant Medications   simvastatin (ZOCOR) 10 MG tablet   Other Relevant Orders   Comp Met (CMET)   Lipid Profile   Major depressive disorder, single episode with psychotic features ([place])- txed by psychiatry (Chronic)    -Followed by Psychiatry. -Stable. Continue current medication  regimen.      Vitamin D insufficiency    -Last Vitamin D 45.1, wnl's. Will repeat Vitamin D today.      Relevant Orders   Vitamin D (25 hydroxy)   Other Visit Diagnoses     Anemia, unspecified type       Relevant Orders   CBC w/Diff   Encounter for tobacco use cessation counseling          Encounter for tobacco use cessation counseling: -The patient was counseled on the dangers of tobacco use, and was advised to quit.  Reviewed strategies to maximize success, including removing cigarettes and smoking materials from environment, stress management, and support of family/friends. Encourage to continue with reducing tobacco use and eventually be able to quit.    Meds ordered this encounter  Medications   simvastatin (ZOCOR) 10 MG tablet    Sig: TAKE 1 TABLET (10 MG TOTAL) BY MOUTH DAILY AT 6 PM.    Dispense:  90 tablet    Refill:  1    Order Specific Question:   Supervising Provider    Answer:   Beatrice Lecher D [[id number]]    Follow-up: Return in about 6 months (around 05/09/2022) for [facility] .    Lorrene Reid, PA-C

## 2021-10-30 NOTE — Patient Instructions (Signed)

## 2021-10-30 NOTE — Assessment & Plan Note (Signed)
-  Last lipid panel: total cholesterol 163, triglycerides 75, HDL 98, LDL 51 (at goal <70). -Continue current medication regimen. Will repeat lipid panel and hepatic function. -Recommend to continue with low fat diet and stay as active as possible. -Will continue to monitor.

## 2021-10-30 NOTE — Assessment & Plan Note (Addendum)
-  Followed by Psychiatry. -Stable. Continue current medication regimen.

## 2021-10-31 LAB — CBC WITH DIFFERENTIAL/PLATELET
Basophils Absolute: 0 10*3/uL (ref 0.0–0.2)
Basos: 1 %
EOS (ABSOLUTE): 0 10*3/uL (ref 0.0–0.4)
Eos: 0 %
Hematocrit: 38.5 % (ref 37.5–51.0)
Hemoglobin: 13.6 g/dL (ref 13.0–17.7)
Immature Grans (Abs): 0 10*3/uL (ref 0.0–0.1)
Immature Granulocytes: 0 %
Lymphocytes Absolute: 2.5 10*3/uL (ref 0.7–3.1)
Lymphs: 46 %
MCH: 34.3 pg — ABNORMAL HIGH (ref 26.6–33.0)
MCHC: 35.3 g/dL (ref 31.5–35.7)
MCV: 97 fL (ref 79–97)
Monocytes Absolute: 0.4 10*3/uL (ref 0.1–0.9)
Monocytes: 7 %
Neutrophils Absolute: 2.5 10*3/uL (ref 1.4–7.0)
Neutrophils: 46 %
Platelets: 182 10*3/uL (ref 150–450)
RBC: 3.97 x10E6/uL — ABNORMAL LOW (ref 4.14–5.80)
RDW: 12.2 % (ref 11.6–15.4)
WBC: 5.5 10*3/uL (ref 3.4–10.8)

## 2021-10-31 LAB — COMPREHENSIVE METABOLIC PANEL
ALT: 16 IU/L (ref 0–44)
AST: 33 IU/L (ref 0–40)
Albumin/Globulin Ratio: 1.6 (ref 1.2–2.2)
Albumin: 4.6 g/dL (ref 3.7–4.7)
Alkaline Phosphatase: 136 IU/L — ABNORMAL HIGH (ref 44–121)
BUN/Creatinine Ratio: 6 — ABNORMAL LOW (ref 10–24)
BUN: 5 mg/dL — ABNORMAL LOW (ref 8–27)
Bilirubin Total: 0.4 mg/dL (ref 0.0–1.2)
CO2: 25 mmol/L (ref 20–29)
Calcium: 9.6 mg/dL (ref 8.6–10.2)
Chloride: 93 mmol/L — ABNORMAL LOW (ref 96–106)
Creatinine, Ser: 0.88 mg/dL (ref 0.76–1.27)
Globulin, Total: 2.8 g/dL (ref 1.5–4.5)
Glucose: 106 mg/dL — ABNORMAL HIGH (ref 70–99)
Potassium: 4.3 mmol/L (ref 3.5–5.2)
Sodium: 133 mmol/L — ABNORMAL LOW (ref 134–144)
Total Protein: 7.4 g/dL (ref 6.0–8.5)
eGFR: 91 mL/min/{1.73_m2} (ref >59)

## 2021-10-31 LAB — LIPID PANEL
Chol/HDL Ratio: 1.5 ratio (ref 0.0–5.0)
Cholesterol, Total: 174 mg/dL (ref 100–199)
HDL: 114 mg/dL (ref >39)
LDL Chol Calc (NIH): 48 mg/dL (ref 0–99)
Triglycerides: 59 mg/dL (ref 0–149)
VLDL Cholesterol Cal: 12 mg/dL (ref 5–40)

## 2021-10-31 LAB — VITAMIN D 25 HYDROXY (VIT D DEFICIENCY, FRACTURES): Vit D, 25-Hydroxy: 42.2 ng/mL (ref 30.0–100.0)

## 2021-11-14 ENCOUNTER — Other Ambulatory Visit: Payer: Self-pay | Admitting: Physician Assistant

## 2021-11-14 DIAGNOSIS — I10 Essential (primary) hypertension: Secondary | ICD-10-CM

## 2021-12-30 DIAGNOSIS — H2513 Age-related nuclear cataract, bilateral: Secondary | ICD-10-CM | POA: Diagnosis not present

## 2021-12-30 DIAGNOSIS — H40013 Open angle with borderline findings, low risk, bilateral: Secondary | ICD-10-CM | POA: Diagnosis not present

## 2022-01-07 DIAGNOSIS — F411 Generalized anxiety disorder: Secondary | ICD-10-CM | POA: Diagnosis not present

## 2022-01-07 DIAGNOSIS — F3176 Bipolar disorder, in full remission, most recent episode depressed: Secondary | ICD-10-CM | POA: Diagnosis not present

## 2022-02-09 ENCOUNTER — Other Ambulatory Visit: Payer: Self-pay | Admitting: Physician Assistant

## 2022-02-09 DIAGNOSIS — I10 Essential (primary) hypertension: Secondary | ICD-10-CM

## 2022-04-07 DIAGNOSIS — F3176 Bipolar disorder, in full remission, most recent episode depressed: Secondary | ICD-10-CM | POA: Diagnosis not present

## 2022-04-07 DIAGNOSIS — F411 Generalized anxiety disorder: Secondary | ICD-10-CM | POA: Diagnosis not present

## 2022-05-08 ENCOUNTER — Other Ambulatory Visit: Payer: Self-pay | Admitting: Physician Assistant

## 2022-05-08 DIAGNOSIS — I10 Essential (primary) hypertension: Secondary | ICD-10-CM

## 2022-05-08 DIAGNOSIS — E7800 Pure hypercholesterolemia, unspecified: Secondary | ICD-10-CM

## 2022-05-13 ENCOUNTER — Other Ambulatory Visit: Payer: Self-pay | Admitting: Physician Assistant

## 2022-05-13 ENCOUNTER — Encounter: Payer: Self-pay | Admitting: Physician Assistant

## 2022-05-13 ENCOUNTER — Ambulatory Visit (INDEPENDENT_AMBULATORY_CARE_PROVIDER_SITE_OTHER): Payer: Medicare Other | Admitting: Physician Assistant

## 2022-05-13 VITALS — BP 130/64 | HR 101 | Temp 97.6°F | Ht 70.0 in | Wt 170.0 lb

## 2022-05-13 DIAGNOSIS — J441 Chronic obstructive pulmonary disease with (acute) exacerbation: Secondary | ICD-10-CM

## 2022-05-13 MED ORDER — PREDNISONE 20 MG PO TABS: 40.0000 mg | ORAL_TABLET | Freq: Every day | ORAL | 0 refills | Status: AC

## 2022-05-13 MED ORDER — DOXYCYCLINE HYCLATE 100 MG PO TABS: 100.0000 mg | ORAL_TABLET | Freq: Two times a day (BID) | ORAL | 0 refills | Status: DC

## 2022-05-13 MED ORDER — ALBUTEROL SULFATE HFA 108 (90 BASE) MCG/ACT IN AERS: 2.0000 | INHALATION_SPRAY | Freq: Four times a day (QID) | RESPIRATORY_TRACT | 0 refills | Status: DC | PRN

## 2022-05-13 MED ORDER — METHYLPREDNISOLONE SODIUM SUCC 125 MG IJ SOLR
125.0000 mg | Freq: Once | INTRAMUSCULAR | Status: AC
  Administered 2022-05-13: 125 mg via INTRAMUSCULAR

## 2022-05-13 NOTE — Progress Notes (Signed)
Established patient acute visit   Patient: Cody Perez   DOB: February 22, 1951   71 y.o. Male  MRN: [id number] Visit Date: 05/13/2022  Chief Complaint  Patient presents with   URI   Subjective    HPI  Patient presents with c/o cough which started last week (Thursday). Cough is about the same and productive with gray sputum. Does report some shortness of breath. Patient's wife states has noticed some wheezing. Reports has been checking oxygen saturations and they have been around 93% but his wife became concerned when it dropped to 90% this morning. States patient's heart rate has been around 60s. Patient denies chest pain, sinus pressure, nasal congestion, runny nose, fever, night sweats or chills.   Medications: Outpatient Medications Prior to Visit  Medication Sig   ABILIFY 10 MG tablet Take 5 mg by mouth daily.    aspirin 325 MG tablet Take 325 mg by mouth daily.   B Complex-C-Folic Acid (STRESS B COMPLEX PO) Take 1 tablet by mouth daily.   clonazePAM (KLONOPIN) 1 MG tablet Take 1 mg by mouth 2 (two) times daily.   donepezil (ARICEPT) 10 MG tablet 1 daily (Patient taking differently: Take 10 mg by mouth at bedtime. 1 daily)   ibuprofen (ADVIL) 600 MG tablet Take 1 tablet (600 mg total) by mouth every 8 (eight) hours as needed for moderate pain.   irbesartan (AVAPRO) 150 MG tablet TAKE 1 TABLET BY MOUTH EVERY DAY   lamoTRIgine (LAMICTAL) 200 MG tablet Take 200 mg by mouth daily.    Multiple Vitamin (MULTIVITAMIN) capsule Take 1 capsule by mouth daily.   QUEtiapine (SEROQUEL) 300 MG tablet Take 300 mg by mouth at bedtime.    risperiDONE (RISPERDAL) 3 MG tablet Take 3 mg by mouth daily.    simvastatin (ZOCOR) 10 MG tablet TAKE 1 TABLET (10 MG TOTAL) BY MOUTH DAILY AT 6 PM.   No facility-administered medications prior to visit.    Review of Systems Review of Systems:  A fourteen system review of systems was performed and found to be positive as per HPI.  Last CBC Lab Results   Component Value Date   WBC 5.5 10/30/2021   HGB 13.6 10/30/2021   HCT 38.5 10/30/2021   MCV 97 10/30/2021   MCH 34.3 (H) 10/30/2021   RDW 12.2 10/30/2021   PLT 182 73/22/0254   Last metabolic panel Lab Results  Component Value Date   GLUCOSE 106 (H) 10/30/2021   NA 133 (L) 10/30/2021   K 4.3 10/30/2021   CL 93 (L) 10/30/2021   CO2 25 10/30/2021   BUN 5 (L) 10/30/2021   CREATININE 0.88 10/30/2021   EGFR 91 10/30/2021   CALCIUM 9.6 10/30/2021   PROT 7.4 10/30/2021   ALBUMIN 4.6 10/30/2021   LABGLOB 2.8 10/30/2021   AGRATIO 1.6 10/30/2021   BILITOT 0.4 10/30/2021   ALKPHOS 136 (H) 10/30/2021   AST 33 10/30/2021   ALT 16 10/30/2021   ANIONGAP 11 04/02/2020   Last lipids Lab Results  Component Value Date   CHOL 174 10/30/2021   HDL 114 10/30/2021   LDLCALC 48 10/30/2021   LDLDIRECT 157.6 10/17/2012   TRIG 59 10/30/2021   CHOLHDL 1.5 10/30/2021   Last hemoglobin A1c Lab Results  Component Value Date   HGBA1C 5.3 05/09/2019   Last thyroid functions Lab Results  Component Value Date   TSH 0.949 05/02/2021   Last vitamin D Lab Results  Component Value Date   VD25OH 42.2 10/30/2021  Objective    BP 130/64   Pulse (!) 101   Temp 97.6 F (36.4 C)   Ht 5' 10" (1.778 m)   Wt 170 lb (77.1 kg)   SpO2 93%   BMI 24.39 kg/m    Physical Exam  General: Pleasant and cooperative, in no acute distress, ill-appearing  Neuro:  Alert and oriented,  extra-ocular muscles intact  HEENT:  Normocephalic, atraumatic, neck supple  Skin:  no gross rash, warm, pink. Cardiac:  RRR, S1 S2 Respiratory: Scattered rhonchi and wheezing, no crackles or rales. Vascular:  Ext warm, no cyanosis apprec.; cap RF less 2 sec. Psych:  No HI/SI, judgement and insight good, Euthymic mood. Full Affect.   No results found for any visits on 05/13/22.  Assessment & Plan     Discussed with patient and wife has s/sx consistent with COPD exacerbation. Oxygen saturation in office is 93%.  Will administer Solu-Medrol 125 mg in office today and will start oral steroid- prednisone 40 mg daily x 5 days which I advised to start tomorrow. Will start antibiotic therapy with doxycycline 100 mg BID x 7 days. Recommend to use albuterol inhaler as needed for shortness of breath or wheezing. Patient's last visit with pulmonology was in 2021, advised to follow-up with pulmonology. Will reassess symptoms in 2 days.    Return for Friday- COPD exacerbation .        Lorrene Reid, PA-C  [facility] [phone] (phone) [phone] (fax)  [place]

## 2022-05-13 NOTE — Patient Instructions (Signed)
Chronic Obstructive Pulmonary Disease  Chronic obstructive pulmonary disease (COPD) is a long-term (chronic) lung problem. When you have COPD, it is hard for air to get in and out of your lungs. Usually the condition gets worse over time, and your lungs will never return to normal. There are things you can do to keep yourself as healthy as possible. What are the causes? Smoking. This is the most common cause. Certain genes passed from parent to child (inherited). What increases the risk? Being exposed to secondhand smoke from cigarettes, pipes, or cigars. Being exposed to chemicals and other irritants, such as fumes and dust in the work environment. Having chronic lung conditions or infections. What are the signs or symptoms? Shortness of breath, especially during physical activity. A long-term cough with a large amount of thick mucus. Sometimes, the cough may not have any mucus (dry cough). Wheezing. Breathing quickly. Skin that looks gray or blue, especially in the fingers, toes, or lips. Feeling tired (fatigue). Weight loss. Chest tightness. Having infections often. Episodes when breathing symptoms become much worse (exacerbations). At the later stages of this disease, you may have swelling in the ankles, feet, or legs. How is this treated? Taking medicines. Quitting smoking, if you smoke. Rehabilitation. This includes steps to make your body work better. It may involve a team of specialists. Doing exercises. Making changes to your diet. Using oxygen. Lung surgery. Lung transplant. Comfort measures (palliative care). Follow these instructions at home: Medicines Take over-the-counter and prescription medicines only as told by your doctor. Talk to your doctor before taking any cough or allergy medicines. You may need to avoid medicines that cause your lungs to be dry. Lifestyle If you smoke, stop smoking. Smoking makes the problem worse. Do not smoke or use any products that  contain nicotine or tobacco. If you need help quitting, ask your doctor. Avoid being around things that make your breathing worse. This may include smoke, chemicals, and fumes. Stay active, but remember to rest as well. Learn and use tips on how to manage stress and control your breathing. Make sure you get enough sleep. Most adults need at least 7 hours of sleep every night. Eat healthy foods. Eat smaller meals more often. Rest before meals. Controlled breathing Learn and use tips on how to control your breathing as told by your doctor. Try: Breathing in (inhaling) through your nose for 1 second. Then, pucker your lips and breath out (exhale) through your lips for 2 seconds. Putting one hand on your belly (abdomen). Breathe in slowly through your nose for 1 second. Your hand on your belly should move out. Pucker your lips and breathe out slowly through your lips. Your hand on your belly should move in as you breathe out.  Controlled coughing Learn and use controlled coughing to clear mucus from your lungs. Follow these steps: Lean your head a little forward. Breathe in deeply. Try to hold your breath for 3 seconds. Keep your mouth slightly open while coughing 2 times. Spit any mucus out into a tissue. Rest and do the steps again 1 or 2 times as needed. General instructions Make sure you get all the shots (vaccines) that your doctor recommends. Ask your doctor about a flu shot and a pneumonia shot. Use oxygen therapy and pulmonary rehabilitation if told by your doctor. If you need home oxygen therapy, ask your doctor if you should buy a tool to measure your oxygen level (oximeter). Make a COPD action plan with your doctor. This helps you   to know what to do if you feel worse than usual. Manage any other conditions you have as told by your doctor. Avoid going outside when it is very hot, cold, or humid. Avoid people who have a sickness you can catch (contagious). Keep all follow-up  visits. Contact a doctor if: You cough up more mucus than usual. There is a change in the color or thickness of the mucus. It is harder to breathe than usual. Your breathing is faster than usual. You have trouble sleeping. You need to use your medicines more often than usual. You have trouble doing your normal activities such as getting dressed or walking around the house. Get help right away if: You have shortness of breath while resting. You have shortness of breath that stops you from: Being able to talk. Doing normal activities. Your chest hurts for longer than 5 minutes. Your skin color is more blue than usual. Your pulse oximeter shows that you have low oxygen for longer than 5 minutes. You have a fever. You feel too tired to breathe normally. These symptoms may represent a serious problem that is an emergency. Do not wait to see if the symptoms will go away. Get medical help right away. Call your local emergency services (911 in the [place] Do not drive yourself to the hospital. Summary Chronic obstructive pulmonary disease (COPD) is a long-term lung problem. The way your lungs work will never return to normal. Usually the condition gets worse over time. There are things you can do to keep yourself as healthy as possible. Take over-the-counter and prescription medicines only as told by your doctor. If you smoke, stop. Smoking makes the problem worse. This information is not intended to replace advice given to you by your health care provider. Make sure you discuss any questions you have with your health care provider. Document Revised: 10/08/2020 Document Reviewed: 10/08/2020 Elsevier Patient Education  2023 [employer]  

## 2022-05-14 NOTE — Progress Notes (Unsigned)
Established patient visit   Patient: Cody Perez   DOB: July 29, 1951   71 y.o. Male  MRN: [id number] Visit Date: 05/15/2022  No chief complaint on file.  Subjective    HPI  Patient presents for follow-up on COPD exacerbation. Reports feeling better. States is breathing better and cough has almost resolved. No fever, chills, or chest pain. States has reduced tobacco use to 1/2 PPD.  Medications: Outpatient Medications Prior to Visit  Medication Sig   ABILIFY 10 MG tablet Take 5 mg by mouth daily.    albuterol (VENTOLIN HFA) 108 (90 Base) MCG/ACT inhaler Inhale 2 puffs into the lungs every 6 (six) hours as needed for wheezing or shortness of breath.   aspirin 325 MG tablet Take 325 mg by mouth daily.   B Complex-C-Folic Acid (STRESS B COMPLEX PO) Take 1 tablet by mouth daily.   clonazePAM (KLONOPIN) 1 MG tablet Take 1 mg by mouth 2 (two) times daily.   donepezil (ARICEPT) 10 MG tablet 1 daily (Patient taking differently: Take 10 mg by mouth at bedtime. 1 daily)   doxycycline (VIBRA-TABS) 100 MG tablet Take 1 tablet (100 mg total) by mouth 2 (two) times daily.   ibuprofen (ADVIL) 600 MG tablet Take 1 tablet (600 mg total) by mouth every 8 (eight) hours as needed for moderate pain.   irbesartan (AVAPRO) 150 MG tablet TAKE 1 TABLET BY MOUTH EVERY DAY   lamoTRIgine (LAMICTAL) 200 MG tablet Take 200 mg by mouth daily.    Multiple Vitamin (MULTIVITAMIN) capsule Take 1 capsule by mouth daily.   predniSONE (DELTASONE) 20 MG tablet Take 2 tablets (40 mg total) by mouth daily with breakfast for 5 days.   QUEtiapine (SEROQUEL) 300 MG tablet Take 300 mg by mouth at bedtime.    risperiDONE (RISPERDAL) 3 MG tablet Take 3 mg by mouth daily.    simvastatin (ZOCOR) 10 MG tablet TAKE 1 TABLET (10 MG TOTAL) BY MOUTH DAILY AT 6 PM.   No facility-administered medications prior to visit.    Review of Systems Review of Systems:  A fourteen system review of systems was performed and found to be positive as  per HPI.   Last CBC Lab Results  Component Value Date   WBC 5.5 10/30/2021   HGB 13.6 10/30/2021   HCT 38.5 10/30/2021   MCV 97 10/30/2021   MCH 34.3 (H) 10/30/2021   RDW 12.2 10/30/2021   PLT 182 17/49/4496   Last metabolic panel Lab Results  Component Value Date   GLUCOSE 106 (H) 10/30/2021   NA 133 (L) 10/30/2021   K 4.3 10/30/2021   CL 93 (L) 10/30/2021   CO2 25 10/30/2021   BUN 5 (L) 10/30/2021   CREATININE 0.88 10/30/2021   EGFR 91 10/30/2021   CALCIUM 9.6 10/30/2021   PROT 7.4 10/30/2021   ALBUMIN 4.6 10/30/2021   LABGLOB 2.8 10/30/2021   AGRATIO 1.6 10/30/2021   BILITOT 0.4 10/30/2021   ALKPHOS 136 (H) 10/30/2021   AST 33 10/30/2021   ALT 16 10/30/2021   ANIONGAP 11 04/02/2020   Last lipids Lab Results  Component Value Date   CHOL 174 10/30/2021   HDL 114 10/30/2021   LDLCALC 48 10/30/2021   LDLDIRECT 157.6 10/17/2012   TRIG 59 10/30/2021   CHOLHDL 1.5 10/30/2021   Last hemoglobin A1c Lab Results  Component Value Date   HGBA1C 5.3 05/09/2019   Last thyroid functions Lab Results  Component Value Date   TSH 0.949 05/02/2021   Last vitamin  D Lab Results  Component Value Date   VD25OH 42.2 10/30/2021     Objective    BP 134/80 (BP Location: Left Arm, Patient Position: Sitting)   Pulse 96   Temp 98.4 F (36.9 C) (Oral)   Ht '5\' 10"'  (1.778 m)   Wt 172 lb (78 kg)   SpO2 92%   BMI 24.68 kg/m  BP Readings from Last 3 Encounters:  05/15/22 134/80  05/13/22 130/64  10/30/21 (!) 178/80   Wt Readings from Last 3 Encounters:  05/15/22 172 lb (78 kg)  05/13/22 170 lb (77.1 kg)  10/30/21 174 lb (78.9 kg)    Physical Exam  General:  Well Developed, well nourished, appropriate for stated age.  Neuro:  Alert and oriented,  extra-ocular muscles intact  HEENT:  Normocephalic, atraumatic, neck supple  Skin:  no gross rash, warm, pink. Cardiac:  RRR, S1 S2 Respiratory: Improved aeration with mild wheezing. Vascular:  Ext warm, no cyanosis  apprec.; cap RF less 2 sec. Psych:  No HI/SI, judgement and insight good, Euthymic mood. Full Affect.   No results found for any visits on 05/15/22.  Assessment & Plan      Problem List Items Addressed This Visit       Cardiovascular and Mediastinum   HTN, goal below 140/90   Relevant Orders   CBC w/Diff   Comp Met (CMET)     Other   Hyperlipidemia (Chronic)   Relevant Orders   Lipid Profile   Vitamin D insufficiency   Relevant Orders   Vitamin D (25 hydroxy)   Other Visit Diagnoses     COPD exacerbation ([place])    -  Primary   Encounter for tobacco use cessation counseling          COPD exacerbation: -Improved. Pulm exam better today. Recommend to continue with antibiotic and steroid as directed. Rechecked pulse manually and improved to 96 bpm. Advised patient to follow-up if symptoms worsen or fail to resolve.  Encounter for tobacco use cessation counseling: -The patient was counseled on the dangers of tobacco use, and was advised to quit.  Reviewed strategies to maximize success, including stress management, substitution of other forms of reinforcement, and support of family/friends. Encourage to continue to reduce tobacco use.   Place future lab orders for [facility].   Return in about 3 months (around 08/15/2022) for [facility] and [facility] few days prior .        Lorrene Reid, PA-C  [facility] [phone] (phone) [phone] (fax)  [place]

## 2022-05-15 ENCOUNTER — Ambulatory Visit (INDEPENDENT_AMBULATORY_CARE_PROVIDER_SITE_OTHER): Payer: Medicare Other | Admitting: Physician Assistant

## 2022-05-15 ENCOUNTER — Encounter: Payer: Self-pay | Admitting: Physician Assistant

## 2022-05-15 VITALS — BP 134/80 | HR 96 | Temp 98.4°F | Ht 70.0 in | Wt 172.0 lb

## 2022-05-15 DIAGNOSIS — E559 Vitamin D deficiency, unspecified: Secondary | ICD-10-CM | POA: Diagnosis not present

## 2022-05-15 DIAGNOSIS — Z716 Tobacco abuse counseling: Secondary | ICD-10-CM | POA: Diagnosis not present

## 2022-05-15 DIAGNOSIS — I10 Essential (primary) hypertension: Secondary | ICD-10-CM | POA: Diagnosis not present

## 2022-05-15 DIAGNOSIS — E7800 Pure hypercholesterolemia, unspecified: Secondary | ICD-10-CM

## 2022-05-15 DIAGNOSIS — J441 Chronic obstructive pulmonary disease with (acute) exacerbation: Secondary | ICD-10-CM

## 2022-05-15 NOTE — Patient Instructions (Signed)
COPD and Physical Activity ?Chronic obstructive pulmonary disease (COPD) is a long-term, or chronic, condition that affects the lungs. COPD is a general term that can be used to describe many problems that cause inflammation of the lungs and limit airflow. These conditions include chronic bronchitis and emphysema. ?The main symptom of COPD is shortness of breath, which makes it harder to do even simple tasks. This can also make it harder to exercise and stay active. Talk with your health care provider about treatments to help you breathe better and actions you can take to prevent breathing problems during physical activity. ?What are the benefits of exercising when you have COPD? ?Exercising regularly is an important part of a healthy lifestyle. You can still exercise and do physical activities even though you have COPD. Exercise and physical activity improve your shortness of breath by increasing blood flow (circulation). This causes your heart to pump more oxygen through your body. Moderate exercise can: ?Improve oxygen use. ?Increase your energy level. ?Help with shortness of breath. ?Strengthen your breathing muscles. ?Improve heart health. ?Help with sleep. ?Improve your self-esteem and feelings of self-worth. ?Lower depression, stress, and anxiety. ?Exercise can benefit everyone with COPD. The severity of your disease may affect how hard you can exercise, especially at first, but everyone can benefit. Talk with your health care provider about how much exercise is safe for you, and which activities and exercises are safe for you. ?What actions can I take to prevent breathing problems during physical activity? ?Sign up for a pulmonary rehabilitation program. This type of program may include: ?Education about lung diseases. ?Exercise classes that teach you how to exercise and be more active while improving your breathing. This usually involves: ?Exercise using your lower extremities, such as a stationary  bicycle. ?About 30 minutes of exercise, 2 to 5 times per week, for 6 to 12 weeks. ?Strength training, such as push-ups or leg lifts. ?Nutrition education. ?Group classes in which you can talk with others who also have COPD and learn ways to manage stress. ?If you use an oxygen tank, you should use it while you exercise. Work with your health care provider to adjust your oxygen for your physical activity. Your resting flow rate is different from your flow rate during physical activity. ?How to manage your breathing while exercising ?While you are exercising: ?Take slow breaths. ?Pace yourself, and do nottry to go too fast. ?Purse your lips while breathing out. Pursing your lips is similar to a kissing or whistling position. ?If doing exercise that uses a quick burst of effort, such as weight lifting: ?Breathe in before starting the exercise. ?Breathe out during the hardest part of the exercise, such as raising the weights. ?Where to find support ?You can find support for exercising with COPD from: ?Your health care provider. ?A pulmonary rehabilitation program. ?Your local health department or community health programs. ?Support groups, either online or in-person. Your health care provider may be able to recommend support groups. ?Where to find more information ?You can find more information about exercising with COPD from: ?American Lung Association: lung.org ?COPD Foundation: copdfoundation.org ?Contact a health care provider if: ?Your symptoms get worse. ?You have nausea. ?You have a fever. ?You want to start a new exercise program or a new activity. ?Get help right away if: ?You have chest pain. ?You cannot breathe. ?These symptoms may represent a serious problem that is an emergency. Do not wait to see if the symptoms will go away. Get medical help right away. Call   your local emergency services (911 in the [place] Do not drive yourself to the hospital. ?Summary ?COPD is a general term that can be used to describe  many different lung problems that cause lung inflammation and limit airflow. This includes chronic bronchitis and emphysema. ?Exercise and physical activity improve your shortness of breath by increasing blood flow (circulation). This causes your heart to provide more oxygen to your body. ?Contact your health care provider before starting any exercise program or new activity. Ask your health care provider what exercises and activities are safe for you. ?This information is not intended to replace advice given to you by your health care provider. Make sure you discuss any questions you have with your health care provider. ?Document Revised: 10/08/2020 Document Reviewed: 10/08/2020 ?Elsevier Patient Education ? 2023 Elsevier Inc. ? ?

## 2022-06-11 ENCOUNTER — Other Ambulatory Visit: Payer: Self-pay | Admitting: Physician Assistant

## 2022-06-11 DIAGNOSIS — J441 Chronic obstructive pulmonary disease with (acute) exacerbation: Secondary | ICD-10-CM

## 2022-07-09 DIAGNOSIS — F3176 Bipolar disorder, in full remission, most recent episode depressed: Secondary | ICD-10-CM | POA: Diagnosis not present

## 2022-07-09 DIAGNOSIS — M79671 Pain in right foot: Secondary | ICD-10-CM | POA: Diagnosis not present

## 2022-07-09 DIAGNOSIS — M79672 Pain in left foot: Secondary | ICD-10-CM | POA: Diagnosis not present

## 2022-07-09 DIAGNOSIS — F411 Generalized anxiety disorder: Secondary | ICD-10-CM | POA: Diagnosis not present

## 2022-07-23 DIAGNOSIS — R2689 Other abnormalities of gait and mobility: Secondary | ICD-10-CM | POA: Diagnosis not present

## 2022-07-23 DIAGNOSIS — R269 Unspecified abnormalities of gait and mobility: Secondary | ICD-10-CM | POA: Diagnosis not present

## 2022-08-04 ENCOUNTER — Other Ambulatory Visit: Payer: Self-pay | Admitting: Physician Assistant

## 2022-08-04 DIAGNOSIS — I10 Essential (primary) hypertension: Secondary | ICD-10-CM

## 2022-08-04 DIAGNOSIS — E7800 Pure hypercholesterolemia, unspecified: Secondary | ICD-10-CM

## 2022-08-19 NOTE — Therapy (Signed)
OUTPATIENT PHYSICAL THERAPY EVALUATION   Patient Name: Cody Perez MRN: [id number] DOB:Dec 10, 1951, 71 y.o., male Today's Date: 08/20/2022   PT End of Session - 08/20/22 1012     Visit Number 1    Number of Visits 9    Date for PT Re-Evaluation 10/15/22    Authorization Type MCR A&B    Progress Note Due on Visit 10    PT Start Time 1000    PT Stop Time 1045    PT Time Calculation (min) 45 min    Activity Tolerance Patient tolerated treatment well    Behavior During Therapy WFL for tasks assessed/performed             Past Medical History:  Diagnosis Date   Hyperlipidemia    Hypertension    History reviewed. No pertinent surgical history. 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 05/18/2008   Hyperlipidemia 03/03/2008   DEMENTIA 03/03/2008   ANXIETY 03/03/2008   Major depressive disorder, 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 He feels his balance is giving him trouble with walking. He did have quad reconstruction a few years ago so the right knee give him trouble and is weak.  PERTINENT HISTORY: Quad muscle rupture and repair in 2015  PAIN:  Are you having pain? No  PRECAUTIONS: Fall  WEIGHT BEARING RESTRICTIONS No  FALLS:  Has patient fallen in last 6 months? No  LIVING ENVIRONMENT: Lives with: lives with their spouse Lives in: House/apartment Stairs: Yes: Internal: 2 flights steps; on right going up and External: 1 steps; on left going up Has following equipment at home: None  OCCUPATION: Clinical research associate  PLOF: Independent  PATIENT GOALS: Get better balance, work on feet   OBJECTIVE:  PATIENT SURVEYS:  FOTO 55%  COGNITION:  Overall cognitive status: Within functional limits for tasks assessed     SENSATION: WFL  MUSCLE LENGTH: Calf flexibility deficit  POSTURE:  Rounded shoulder and forward head posture  PALPATION: Non tender to palpation  LOWER EXTREMITY MMT:  MMT Right eval Left eval  Hip flexion 4 4  Hip extension 3 3  Hip abduction 3+ 4-  Knee flexion 4+ 4+  Knee extension * 4+  Ankle dorsiflexion 5 5  Ankle plantarflexion 3 2  Ankle inversion 2 2  Ankle eversion 4 4   *Patient unable to perform full  active knee extension due to previous quad tendon repair, able to hold against resistance at lower range of knee flexion  FUNCTIONAL TESTS:  5 times sit to stand: 16 seconds 6 minute walk test: 610 ft.  (rest break needed at 3 min for 45 seconds) BERG balance: 37/56 (see below)  BERG BALANCE TEST Sitting to Standing: 4.      Stands without using hands and stabilize independently Standing Unsupported: 3.      Stands 2 minutes with supervision Sitting Unsupported: 4.     Sits for 2 minutes independently Standing to Sitting: 4.     Sits safely with minimal use of hands Transfers: 4.     Transfers  safely with minor use of hands Standing with eyes closed: 2.     Able to stand for 3 seconds Standing with feet together: 3.     Stands for 1 minute with supervision Reaching forward with outstretched arm: 3.     Reaches forward 5 inches Retrieving object from the floor: 3.     Able to pick up with supervision Turning to look behind: 2.     Turns sideways only, maintains balance Turning 360 degrees: 2.     Able to turn slowly, but safely Place alternate foot on stool: 1.     Completes >2 steps with minimal assist Standing with one foot in front: 0.     Loses balance while standing/stepping Standing on one foot: 0.     Unable   GAIT: Distance walked: 610 ft Assistive device utilized: None Level of assistance: Complete Independence Comments: Bilateral toe out, right quad avoidance with knee extension, unsteady gait   TODAY'S TREATMENT: Sit to stand without UE support x 10 Romberg with head turns x 10 1/2 tandem stance x 30 sec each Standing hip abduction x 10 each Standing hip extension x 10 each Standing heel raises x 10 Standing calf stretch x 30 sec   PATIENT EDUCATION:  Education details: Exam findings, POC, HEP Person educated: Patient Education method: Explanation, Demonstration, Tactile cues, Verbal cues, and Handouts Education comprehension: verbalized understanding, returned demonstration, verbal cues required, tactile cues required, and needs further education  HOME EXERCISE PROGRAM: Access Code: [email or web address]   ASSESSMENT: CLINICAL IMPRESSION: Patient is a 71 y.o. male who was seen today for physical therapy evaluation and treatment for LE weakness, balance and gait impairment, and endurance deficit. He demonstrate significant deformity of both feet, generalized LE weakness worse on right with previous quad tendon repair, balance and gait deficit leading to a limitation in his mobility and walking ability.   OBJECTIVE IMPAIRMENTS Abnormal gait, decreased activity  tolerance, decreased balance, difficulty walking, decreased strength, impaired flexibility, postural dysfunction, and pain.   ACTIVITY LIMITATIONS standing, stairs, and locomotion level  PARTICIPATION LIMITATIONS: shopping and community activity  PERSONAL FACTORS Fitness, Past/current experiences, Time since onset of injury/illness/exacerbation, and 3+ comorbidities: see above  are also affecting patient's functional outcome.   REHAB POTENTIAL: Good  CLINICAL DECISION MAKING: Stable/uncomplicated  EVALUATION COMPLEXITY: Low   GOALS: Goals reviewed with patient? Yes  SHORT TERM GOALS: Target date: 09/17/2022   Patient will be I with initial HEP in order to progress with therapy. Baseline: HEP provided at eval Goal status: INITIAL  2.  PT will review FOTO with patient by 3rd visit in order to understand expected progress and outcome with therapy. Baseline: FOTO assessed at eval Goal status: INITIAL  3.  Patient will perform 5xSTS in </= 12 seconds to indicate improved strength and  reduced fall risk. Baseline: 16 seconds Goal status: INITIAL  LONG TERM GOALS: Target date: 10/15/2022   Patient will be I with final HEP to maintain progress from PT. Baseline: HEP provided at eval Goal status: INITIAL  2.  Patient will report >/= 71% status on FOTO to indicate improved functional ability. Baseline: 55% functional status Goal status: INITIAL  3.  Patient will ambulate >/= 1000 ft. With 6MWT in order to improve community ambulation. Baseline: 610 ft. Goal status: INITIAL  4.  Patient will demonstrate BERG >/= 45/56 in order to indicate improved balance and reduced fall risk. Baseline: 37/56 Goal status: INITIAL   PLAN: PT FREQUENCY: 1x/week  PT DURATION: 8 weeks  PLANNED INTERVENTIONS: Therapeutic exercises, Therapeutic activity, Neuromuscular re-education, Balance training, Gait training, Patient/Family education, Self Care, Joint mobilization, Joint manipulation,  Aquatic Therapy, Dry Needling, Manual therapy, and Re-evaluation  PLAN FOR NEXT SESSION: Review HEP and progress PRN, progress LE strength, endurance, and balance training   Hilda Blades, PT, DPT, LAT, ATC 08/20/22  3:12 PM Phone: [phone] Fax: [phone]

## 2022-08-20 ENCOUNTER — Other Ambulatory Visit: Payer: Self-pay

## 2022-08-20 ENCOUNTER — Ambulatory Visit: Payer: Medicare Other | Attending: Orthopaedic Surgery | Admitting: Physical Therapy

## 2022-08-20 ENCOUNTER — Encounter: Payer: Self-pay | Admitting: Physical Therapy

## 2022-08-20 DIAGNOSIS — R2689 Other abnormalities of gait and mobility: Secondary | ICD-10-CM | POA: Diagnosis not present

## 2022-08-20 DIAGNOSIS — R293 Abnormal posture: Secondary | ICD-10-CM | POA: Insufficient documentation

## 2022-08-20 DIAGNOSIS — M6281 Muscle weakness (generalized): Secondary | ICD-10-CM | POA: Diagnosis not present

## 2022-08-20 NOTE — Patient Instructions (Signed)
Access Code: [email or web address] URL: [email or web address] Date: 08/20/2022 Prepared by: Rosana Hoes  Exercises - Sit to Stand Without Arm Support  - 2 x daily - 10 reps - Romberg Stance with Head Rotation  - 2 x daily - 3 sets - 10 reps - Standing Romberg to 1/2 Tandem Stance  - 2 x daily - 3 sets - 30 seconds hold - Standing Hip Abduction with Counter Support  - 2 x daily - 10 reps - Standing Hip Extension with Counter Support  - 2 x daily - 10 reps - Heel Raises with Counter Support  - 2 x daily - 10 reps - Standing Gastroc Stretch at Counter  - 2 x daily - 2 sets - 30 seconds hold

## 2022-08-25 ENCOUNTER — Other Ambulatory Visit: Payer: Medicare Other

## 2022-08-25 DIAGNOSIS — E559 Vitamin D deficiency, unspecified: Secondary | ICD-10-CM | POA: Diagnosis not present

## 2022-08-25 DIAGNOSIS — E7800 Pure hypercholesterolemia, unspecified: Secondary | ICD-10-CM

## 2022-08-25 DIAGNOSIS — I10 Essential (primary) hypertension: Secondary | ICD-10-CM | POA: Diagnosis not present

## 2022-08-26 LAB — CBC WITH DIFFERENTIAL/PLATELET
Basophils Absolute: 0 10*3/uL (ref 0.0–0.2)
Basos: 1 %
EOS (ABSOLUTE): 0.1 10*3/uL (ref 0.0–0.4)
Eos: 2 %
Hematocrit: 38.7 % (ref 37.5–51.0)
Hemoglobin: 14.1 g/dL (ref 13.0–17.7)
Immature Grans (Abs): 0 10*3/uL (ref 0.0–0.1)
Immature Granulocytes: 0 %
Lymphocytes Absolute: 3.6 10*3/uL — ABNORMAL HIGH (ref 0.7–3.1)
Lymphs: 60 %
MCH: 35 pg — ABNORMAL HIGH (ref 26.6–33.0)
MCHC: 36.4 g/dL — ABNORMAL HIGH (ref 31.5–35.7)
MCV: 96 fL (ref 79–97)
Monocytes Absolute: 0.5 10*3/uL (ref 0.1–0.9)
Monocytes: 8 %
Neutrophils Absolute: 1.7 10*3/uL (ref 1.4–7.0)
Neutrophils: 29 %
Platelets: 160 10*3/uL (ref 150–450)
RBC: 4.03 x10E6/uL — ABNORMAL LOW (ref 4.14–5.80)
RDW: 11.8 % (ref 11.6–15.4)
WBC: 5.9 10*3/uL (ref 3.4–10.8)

## 2022-08-26 LAB — COMPREHENSIVE METABOLIC PANEL
ALT: 16 IU/L (ref 0–44)
AST: 28 IU/L (ref 0–40)
Albumin/Globulin Ratio: 1.6 (ref 1.2–2.2)
Albumin: 4.2 g/dL (ref 3.8–4.8)
Alkaline Phosphatase: 145 IU/L — ABNORMAL HIGH (ref 44–121)
BUN/Creatinine Ratio: 11 (ref 10–24)
BUN: 10 mg/dL (ref 8–27)
Bilirubin Total: 0.4 mg/dL (ref 0.0–1.2)
CO2: 24 mmol/L (ref 20–29)
Calcium: 9.3 mg/dL (ref 8.6–10.2)
Chloride: 99 mmol/L (ref 96–106)
Creatinine, Ser: 0.93 mg/dL (ref 0.76–1.27)
Globulin, Total: 2.6 g/dL (ref 1.5–4.5)
Glucose: 97 mg/dL (ref 70–99)
Potassium: 4.9 mmol/L (ref 3.5–5.2)
Sodium: 136 mmol/L (ref 134–144)
Total Protein: 6.8 g/dL (ref 6.0–8.5)
eGFR: 87 mL/min/{1.73_m2} (ref >59)

## 2022-08-26 LAB — LIPID PANEL
Chol/HDL Ratio: 1.5 ratio (ref 0.0–5.0)
Cholesterol, Total: 142 mg/dL (ref 100–199)
HDL: 97 mg/dL (ref >39)
LDL Chol Calc (NIH): 32 mg/dL (ref 0–99)
Triglycerides: 64 mg/dL (ref 0–149)
VLDL Cholesterol Cal: 13 mg/dL (ref 5–40)

## 2022-08-26 LAB — VITAMIN D 25 HYDROXY (VIT D DEFICIENCY, FRACTURES): Vit D, 25-Hydroxy: 40.3 ng/mL (ref 30.0–100.0)

## 2022-08-27 ENCOUNTER — Other Ambulatory Visit: Payer: Medicare Other

## 2022-08-27 ENCOUNTER — Encounter: Payer: Self-pay | Admitting: Physical Therapy

## 2022-08-27 ENCOUNTER — Ambulatory Visit: Payer: Medicare Other | Admitting: Physical Therapy

## 2022-08-27 DIAGNOSIS — M6281 Muscle weakness (generalized): Secondary | ICD-10-CM | POA: Diagnosis not present

## 2022-08-27 DIAGNOSIS — R2689 Other abnormalities of gait and mobility: Secondary | ICD-10-CM | POA: Diagnosis not present

## 2022-08-27 DIAGNOSIS — R293 Abnormal posture: Secondary | ICD-10-CM | POA: Diagnosis not present

## 2022-08-27 NOTE — Therapy (Signed)
OUTPATIENT PHYSICAL THERAPY TREATMENT NOTE   Patient Name: Cody Perez MRN: [id number] DOB:August 16, 1951, 71 y.o., male Today's Date: 08/27/2022  PCP: Mayer Masker, PA-C   REFERRING PROVIDER: Netta Cedars, MD  END OF SESSION:   PT End of Session - 08/27/22 0933     Visit Number 2    Number of Visits 9    Date for PT Re-Evaluation 10/15/22    Authorization Type MCR A&B    Progress Note Due on Visit 10    PT Start Time 0930    PT Stop Time 1010    PT Time Calculation (min) 40 min             Past Medical History:  Diagnosis Date   Hyperlipidemia    Hypertension    History reviewed. No pertinent surgical history. 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 No pain right now in feet or legs. Back bothers me sometimes.   PAIN:  Are you having pain?  No   OBJECTIVE: (objective measures completed at initial evaluation unless otherwise dated)   PATIENT SURVEYS:  FOTO 55%   COGNITION:           Overall cognitive status: Within functional limits for tasks assessed                          SENSATION: WFL   MUSCLE LENGTH: Calf flexibility deficit   POSTURE:  Rounded shoulder and forward head posture   PALPATION: Non tender to palpation   LOWER EXTREMITY MMT:   MMT Right eval Left eval  Hip flexion 4 4  Hip extension 3 3  Hip abduction 3+ 4-  Knee flexion 4+ 4+  Knee extension * 4+  Ankle dorsiflexion 5 5  Ankle plantarflexion 3 2  Ankle inversion 2 2  Ankle eversion 4 4    *Patient unable to perform full active knee extension due to previous quad tendon repair, able to hold against resistance at lower range of knee flexion   FUNCTIONAL TESTS:  5 times sit to stand: 16 seconds 6 minute walk test: 610 ft.  (rest break needed at 3 min for 45 seconds) BERG balance: 37/56 (see below)   BERG BALANCE TEST Sitting to Standing: 4.      Stands without using hands and stabilize independently Standing Unsupported: 3.  Stands 2 minutes with supervision Sitting Unsupported: 4.     Sits for 2 minutes independently Standing to Sitting: 4.     Sits safely with minimal use of hands Transfers: 4.     Transfers safely with minor use of hands Standing with eyes closed: 2.     Able to stand for 3 seconds Standing with feet together: 3.     Stands for 1 minute with supervision Reaching forward with outstretched arm: 3.     Reaches forward 5 inches Retrieving object from the floor: 3.     Able to pick up with supervision Turning to look behind: 2.     Turns sideways only, maintains balance Turning 360 degrees: 2.     Able to turn slowly, but safely Place alternate foot on stool: 1.     Completes >2 steps with minimal assist Standing with one foot in front: 0.     Loses balance while standing/stepping Standing on one foot: 0.      Unable     GAIT: Distance walked: 610 ft Assistive device utilized: None Level of assistance: Complete Independence Comments: Bilateral toe out, right quad avoidance with knee extension, unsteady gait       TODAY'S TREATMENT" [facility] Adult PT Treatment:                                                DATE: 08/27/22 Therapeutic Exercise: Nustep L4 x 5 minutes Standing heel raises x 15 Standing hip abduction 10 x 2  Standing hip ext 10 x 2  Supine marching Supine clam yellow x 20 Supine Bridge x 15, 5 sec  STS 10 x 2  1/2 tandem with head turns Rhomberg EC  Side stepping at counter  Alt March at counter with UE  Gastroc stretch at counter   EVAL Sit to stand without UE support x 10 Romberg with head turns x 10 1/2 tandem stance x 30 sec each Standing hip abduction x 10 each Standing hip extension x 10 each Standing heel raises x 10 Standing calf stretch x 30 sec     PATIENT EDUCATION:  Education details: Exam findings, POC, HEP Person educated: Patient Education method: Explanation, Demonstration, Tactile cues, Verbal cues, and Handouts Education comprehension: verbalized understanding, returned demonstration, verbal cues required, tactile cues required, and needs further education   HOME EXERCISE PROGRAM: Access Code: [email or web address]     ASSESSMENT: CLINICAL IMPRESSION: Patient is a 71 y.o. male who was seen today for physical therapy  treatment for LE weakness, balance and gait impairment, and endurance deficit. Reviewed HEP and progressed balance and gait. He tolerated session well with min increased in back pain during closed chain therex.      OBJECTIVE IMPAIRMENTS Abnormal gait, decreased activity tolerance, decreased balance, difficulty walking, decreased strength, impaired flexibility, postural dysfunction, and pain.    ACTIVITY LIMITATIONS standing, stairs, and locomotion level   PARTICIPATION LIMITATIONS: shopping and community activity   PERSONAL FACTORS  Fitness, Past/current experiences, Time since onset of injury/illness/exacerbation, and 3+ comorbidities: see above  are also affecting patient's functional outcome.    REHAB POTENTIAL: Good   CLINICAL DECISION MAKING: Stable/uncomplicated   EVALUATION COMPLEXITY: Low     GOALS: Goals reviewed with patient? Yes   SHORT TERM GOALS: Target date: 09/17/2022    Patient will be I with initial HEP in order to  progress with therapy. Baseline: HEP provided at eval Goal status: INITIAL   2.  PT will review FOTO with patient by 3rd visit in order to understand expected progress and outcome with therapy. Baseline: FOTO assessed at eval Goal status: INITIAL   3.  Patient will perform 5xSTS in </= 12 seconds to indicate improved strength and reduced fall risk. Baseline: 16 seconds Goal status: INITIAL   LONG TERM GOALS: Target date: 10/15/2022    Patient will be I with final HEP to maintain progress from PT. Baseline: HEP provided at eval Goal status: INITIAL   2.  Patient will report >/= 71% status on FOTO to indicate improved functional ability. Baseline: 55% functional status Goal status: INITIAL   3.  Patient will ambulate >/= 1000 ft. With in order to improve community ambulation. Baseline: 610 ft. Goal status: INITIAL   4.  Patient will demonstrate BERG >/= 45/56 in order to indicate improved balance and reduced fall risk. Baseline: 37/56 Goal status: INITIAL     PLAN: PT FREQUENCY: 1x/week   PT DURATION: 8 weeks   PLANNED INTERVENTIONS: Therapeutic exercises, Therapeutic activity, Neuromuscular re-education, Balance training, Gait training, Patient/Family education, Self Care, Joint mobilization, Joint manipulation, Aquatic Therapy, Dry Needling, Manual therapy, and Re-evaluation   PLAN FOR NEXT SESSION: Review HEP and progress PRN, progress LE strength, endurance, and balance training        Jannette Spanner, PTA 08/27/22 11:20 AM Phone: [phone] Fax:  [phone]

## 2022-09-03 ENCOUNTER — Encounter: Payer: Self-pay | Admitting: Physical Therapy

## 2022-09-03 ENCOUNTER — Ambulatory Visit: Payer: Medicare Other | Admitting: Physical Therapy

## 2022-09-03 ENCOUNTER — Encounter: Payer: Medicare Other | Admitting: Physician Assistant

## 2022-09-03 ENCOUNTER — Other Ambulatory Visit: Payer: Self-pay

## 2022-09-03 DIAGNOSIS — R2689 Other abnormalities of gait and mobility: Secondary | ICD-10-CM

## 2022-09-03 DIAGNOSIS — M6281 Muscle weakness (generalized): Secondary | ICD-10-CM

## 2022-09-03 DIAGNOSIS — R293 Abnormal posture: Secondary | ICD-10-CM | POA: Diagnosis not present

## 2022-09-03 NOTE — Therapy (Signed)
OUTPATIENT PHYSICAL THERAPY TREATMENT NOTE   Patient Name: Cody Perez MRN: [id number] DOB:23-Nov-1951, 71 y.o., male Today's Date: 09/03/2022  PCP: Lorrene Reid, PA-C   REFERRING PROVIDER: Armond Hang, MD  END OF SESSION:   PT End of Session - 09/03/22 1047     Visit Number 3    Number of Visits 9    Date for PT Re-Evaluation 10/15/22    Authorization Type MCR A&B    Progress Note Due on Visit 10    PT Start Time 1045    PT Stop Time 1125    PT Time Calculation (min) 40 min    Activity Tolerance Patient tolerated treatment well    Behavior During Therapy WFL for tasks assessed/performed              Past Medical History:  Diagnosis Date   Hyperlipidemia    Hypertension    History reviewed. No pertinent surgical history. 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 05/18/2008   Hyperlipidemia 03/03/2008   DEMENTIA 03/03/2008   ANXIETY 03/03/2008   Major depressive disorder, single episode with psychotic features ([place])- txed by psychiatry 03/03/2008   PERIPHERAL NEUROPATHY 03/03/2008   COPD GOLD II active smoker 03/03/2008    REFERRING DIAG: Pain in right foot   THERAPY DIAG:  Other abnormalities of gait and mobility  Muscle weakness (generalized)  Rationale for Evaluation and Treatment Rehabilitation  PERTINENT HISTORY: Quad muscle rupture and repair in 2015   PRECAUTIONS:  Fall    SUBJECTIVE: Patient reports he is doing well,  his back has been bothering him some.   PAIN:  Are you having pain? No   OBJECTIVE: (objective measures completed at initial evaluation unless otherwise dated) PATIENT SURVEYS:  FOTO 55%   MUSCLE LENGTH: Calf flexibility deficit   POSTURE:  Rounded shoulder and forward head posture   PALPATION: Non tender to palpation   LOWER EXTREMITY MMT:   MMT Right eval Left eval  Hip flexion 4 4  Hip extension 3 3  Hip abduction 3+ 4-  Knee flexion 4+ 4+  Knee extension * 4+  Ankle dorsiflexion 5 5  Ankle plantarflexion 3 2  Ankle inversion 2 2  Ankle eversion 4 4    *Patient unable to perform full active knee extension due to previous quad tendon repair, able to hold against resistance at lower range of knee flexion   FUNCTIONAL TESTS:  5 times sit to stand: 16 seconds  09/03/2022: 14 seconds 6 minute walk test: 610 ft. (rest break needed at 3 min for 45 seconds) BERG balance: 37/56 (see initial eval)   GAIT: Distance walked: 610 ft Assistive device utilized: None Level of assistance: Complete Independence Comments: Bilateral toe out, right quad avoidance with knee extension, unsteady gait    TODAY'S TREATMENT [facility] Adult PT Treatment:  DATE: 09/03/22 Therapeutic Exercise: Nustep L6 x 5 minutes with UE/LE while taking subjective Slant board calf stretch 2 x 30 sec Standing heel raises 2 x 20 Standing hip abduction with red at knees 2 x 10 each Standing hip extension with red at knees 2 x 10 each Bridge 2 x 10 Hooklying clamshell with green 2 x 15 Supine alternating march with green 2 x 20 Sit to stand 2 x 10 Neuro Reed: 3/4 tandem stance 2 x 30 sec Romberg on Airex x 30 sec Romberg on Airex with head turns 3 x 10   [facility] Adult PT Treatment:                                                DATE: 08/27/22 Therapeutic Exercise: Nustep L4 x 5 minutes Standing heel raises x 15 Standing hip abduction 10 x 2  Standing hip  ext 10 x 2  Supine marching Supine clam yellow x 20 Supine Bridge x 15, 5 sec  STS 10 x 2  1/2 tandem with head turns Rhomberg EC  Side stepping at counter  Alt March at counter with UE  Gastroc stretch at counter  [facility] Adult PT Treatment:                                                DATE: 08/20/22 Therapeutic Exercise: Sit to stand without UE support x 10 Romberg with head turns x 10 1/2 tandem stance x 30 sec each Standing hip abduction x 10 each Standing hip extension x 10 each Standing heel raises x 10 Standing calf stretch x 30 sec   PATIENT EDUCATION:  Education details: HEP Person educated: Patient Education method: Programmer, multimedia, Demonstration, Actor cues, Verbal cues Education comprehension: verbalized understanding, returned demonstration, verbal cues required, tactile cues required, and needs further education   HOME EXERCISE PROGRAM: Access Code: [email or web address]     ASSESSMENT: CLINICAL IMPRESSION: Patient tolerated therapy well with no adverse effects. Therapy focused on continued LE strengthening and balance training. He was able to complete all exercises and demonstrates improvement in 5xSTS this visit. He did require occasional UE support and reported fatigue with balance training. No changes to HEP this visit. Patient would benefit from continued skilled PT to progress his mobility and strength in order improve gait and balance to maximize functional ability.      OBJECTIVE IMPAIRMENTS Abnormal gait, decreased activity tolerance, decreased balance, difficulty walking, decreased strength, impaired flexibility, postural dysfunction, and pain.    ACTIVITY LIMITATIONS standing, stairs, and locomotion level   PARTICIPATION LIMITATIONS: shopping and community activity   PERSONAL FACTORS Fitness, Past/current experiences, Time since onset of injury/illness/exacerbation, and 3+ comorbidities: see above  are also affecting patient's functional outcome.      GOALS: Goals  reviewed with patient? Yes   SHORT TERM GOALS: Target date: 09/17/2022    Patient will be I with initial HEP in order to progress with therapy. Baseline: HEP provided at eval Goal status: INITIAL   2.  PT will review FOTO with patient by 3rd visit in order to understand expected progress and outcome with therapy. Baseline: FOTO assessed at eval Goal status: INITIAL   3.  Patient will perform 5xSTS in </= 12 seconds to  indicate improved strength and reduced fall risk. Baseline: 16 seconds Goal status: INITIAL   LONG TERM GOALS: Target date: 10/15/2022    Patient will be I with final HEP to maintain progress from PT. Baseline: HEP provided at eval Goal status: INITIAL   2.  Patient will report >/= 71% status on FOTO to indicate improved functional ability. Baseline: 55% functional status Goal status: INITIAL   3.  Patient will ambulate >/= 1000 ft. With in order to improve community ambulation. Baseline: 610 ft. Goal status: INITIAL   4.  Patient will demonstrate BERG >/= 45/56 in order to indicate improved balance and reduced fall risk. Baseline: 37/56 Goal status: INITIAL     PLAN: PT FREQUENCY: 1x/week   PT DURATION: 8 weeks   PLANNED INTERVENTIONS: Therapeutic exercises, Therapeutic activity, Neuromuscular re-education, Balance training, Gait training, Patient/Family education, Self Care, Joint mobilization, Joint manipulation, Aquatic Therapy, Dry Needling, Manual therapy, and Re-evaluation   PLAN FOR NEXT SESSION: Review HEP and progress PRN, progress LE strength, endurance, and balance training      Rosana Hoes, PT, DPT, LAT, ATC 09/03/22  12:13 PM Phone: [phone] Fax: [phone]

## 2022-09-07 ENCOUNTER — Ambulatory Visit (INDEPENDENT_AMBULATORY_CARE_PROVIDER_SITE_OTHER): Payer: Medicare Other | Admitting: Physician Assistant

## 2022-09-07 ENCOUNTER — Encounter: Payer: Self-pay | Admitting: Physician Assistant

## 2022-09-07 VITALS — BP 136/80 | HR 102 | Temp 97.7°F | Ht 70.0 in | Wt 159.0 lb

## 2022-09-07 DIAGNOSIS — I10 Essential (primary) hypertension: Secondary | ICD-10-CM | POA: Diagnosis not present

## 2022-09-07 DIAGNOSIS — F17210 Nicotine dependence, cigarettes, uncomplicated: Secondary | ICD-10-CM | POA: Diagnosis not present

## 2022-09-07 DIAGNOSIS — J449 Chronic obstructive pulmonary disease, unspecified: Secondary | ICD-10-CM | POA: Diagnosis not present

## 2022-09-07 DIAGNOSIS — Z0000 Encounter for general adult medical examination without abnormal findings: Secondary | ICD-10-CM

## 2022-09-07 NOTE — Patient Instructions (Signed)
Preventive Care 65 Years and Older, Male Preventive care refers to lifestyle choices and visits with your health care provider that can promote health and wellness. Preventive care visits are also called wellness exams. What can I expect for my preventive care visit? Counseling During your preventive care visit, your health care provider may ask about your: Medical history, including: Past medical problems. Family medical history. History of falls. Current health, including: Emotional well-being. Home life and relationship well-being. Sexual activity. Memory and ability to understand (cognition). Lifestyle, including: Alcohol, nicotine or tobacco, and drug use. Access to firearms. Diet, exercise, and sleep habits. Work and work environment. Sunscreen use. Safety issues such as seatbelt and bike helmet use. Physical exam Your health care provider will check your: Height and weight. These may be used to calculate your BMI (body mass index). BMI is a measurement that tells if you are at a healthy weight. Waist circumference. This measures the distance around your waistline. This measurement also tells if you are at a healthy weight and may help predict your risk of certain diseases, such as type 2 diabetes and high blood pressure. Heart rate and blood pressure. Body temperature. Skin for abnormal spots. What immunizations do I need?  Vaccines are usually given at various ages, according to a schedule. Your health care provider will recommend vaccines for you based on your age, medical history, and lifestyle or other factors, such as travel or where you work. What tests do I need? Screening Your health care provider may recommend screening tests for certain conditions. This may include: Lipid and cholesterol levels. Diabetes screening. This is done by checking your blood sugar (glucose) after you have not eaten for a while (fasting). Hepatitis C test. Hepatitis B test. HIV (human  immunodeficiency virus) test. STI (sexually transmitted infection) testing, if you are at risk. Lung cancer screening. Colorectal cancer screening. Prostate cancer screening. Abdominal aortic aneurysm (AAA) screening. You may need this if you are a current or former smoker. Talk with your health care provider about your test results, treatment options, and if necessary, the need for more tests. Follow these instructions at home: Eating and drinking  Eat a diet that includes fresh fruits and vegetables, whole grains, lean protein, and low-fat dairy products. Limit your intake of foods with high amounts of sugar, saturated fats, and salt. Take vitamin and mineral supplements as recommended by your health care provider. Do not drink alcohol if your health care provider tells you not to drink. If you drink alcohol: Limit how much you have to 0-2 drinks a day. Know how much alcohol is in your drink. In the [place] one drink equals one 12 oz bottle of beer (355 mL), one 5 oz glass of wine (148 mL), or one 1 oz glass of hard liquor (44 mL). Lifestyle Brush your teeth every morning and night with fluoride toothpaste. Floss one time each day. Exercise for at least 30 minutes 5 or more days each week. Do not use any products that contain nicotine or tobacco. These products include cigarettes, chewing tobacco, and vaping devices, such as e-cigarettes. If you need help quitting, ask your health care provider. Do not use drugs. If you are sexually active, practice safe sex. Use a condom or other form of protection to prevent STIs. Take aspirin only as told by your health care provider. Make sure that you understand how much to take and what form to take. Work with your health care provider to find out whether it is safe   and beneficial for you to take aspirin daily. Ask your health care provider if you need to take a cholesterol-lowering medicine (statin). Find healthy ways to manage stress, such  as: Meditation, yoga, or listening to music. Journaling. Talking to a trusted person. Spending time with friends and family. Safety Always wear your seat belt while driving or riding in a vehicle. Do not drive: If you have been drinking alcohol. Do not ride with someone who has been drinking. When you are tired or distracted. While texting. If you have been using any mind-altering substances or drugs. Wear a helmet and other protective equipment during sports activities. If you have firearms in your house, make sure you follow all gun safety procedures. Minimize exposure to UV radiation to reduce your risk of skin cancer. What's next? Visit your health care provider once a year for an annual wellness visit. Ask your health care provider how often you should have your eyes and teeth checked. Stay up to date on all vaccines. This information is not intended to replace advice given to you by your health care provider. Make sure you discuss any questions you have with your health care provider. Document Revised: 05/28/2021 Document Reviewed: 05/28/2021 Elsevier Patient Education  2023 [employer]  

## 2022-09-07 NOTE — Progress Notes (Signed)
Subjective:   Cody Perez is a 71 y.o. male who presents for Medicare Annual/Subsequent preventive examination.  Review of Systems    General:   No F/C, wt loss Pulm:   No DIB, SOB, pleuritic chest pain Card:  No CP, palpitations Abd:  No n/v/d or pain Ext:  No inc edema from baseline    Objective:    Today's Vitals   09/07/22 1315 09/07/22 1350  BP: (!) 150/75 136/80  Pulse: (!) 102   Temp: 97.7 F (36.5 C)   TempSrc: Temporal   SpO2: 97%   Weight: 159 lb (72.1 kg)   Height: 5\' 10"  (1.778 m)    Body mass index is 22.81 kg/m.     08/20/2022    3:04 PM 04/02/2020   12:27 PM 04/09/2016    1:10 PM  Advanced Directives  Does Patient Have a Medical Advance Directive? No No No  Would patient like information on creating a medical advance directive? No - Patient declined No - Patient declined     Current Medications (verified) Outpatient Encounter Medications as of 09/07/2022  Medication Sig   ABILIFY 10 MG tablet Take 5 mg by mouth daily.    aspirin 325 MG tablet Take 325 mg by mouth daily.   B Complex-C-Folic Acid (STRESS B COMPLEX PO) Take 1 tablet by mouth daily.   clonazePAM (KLONOPIN) 1 MG tablet Take 1 mg by mouth 2 (two) times daily.   donepezil (ARICEPT) 10 MG tablet 1 daily (Patient taking differently: Take 10 mg by mouth at bedtime. 1 daily)   doxycycline (VIBRA-TABS) 100 MG tablet Take 1 tablet (100 mg total) by mouth 2 (two) times daily.   ibuprofen (ADVIL) 600 MG tablet Take 1 tablet (600 mg total) by mouth every 8 (eight) hours as needed for moderate pain.   irbesartan (AVAPRO) 150 MG tablet TAKE 1 TABLET BY MOUTH EVERY DAY   lamoTRIgine (LAMICTAL) 200 MG tablet Take 200 mg by mouth daily.    Multiple Vitamin (MULTIVITAMIN) capsule Take 1 capsule by mouth daily.   QUEtiapine (SEROQUEL) 300 MG tablet Take 300 mg by mouth at bedtime.    risperiDONE (RISPERDAL) 3 MG tablet Take 3 mg by mouth daily.    simvastatin (ZOCOR) 10 MG tablet TAKE 1 TABLET (10 MG  TOTAL) BY MOUTH DAILY AT 6 PM.   VENTOLIN HFA 108 (90 Base) MCG/ACT inhaler TAKE 2 PUFFS BY MOUTH EVERY 6 HOURS AS NEEDED FOR WHEEZE OR SHORTNESS OF BREATH   No facility-administered encounter medications on file as of 09/07/2022.    Allergies (verified) Patient has no known allergies.   History: Past Medical History:  Diagnosis Date   Hyperlipidemia    Hypertension    History reviewed. No pertinent surgical history. Family History  Adopted: Yes   Social History   Socioeconomic History   Marital status: Married    Spouse name: Not on file   Number of children: Not on file   Years of education: Not on file   Highest education level: Not on file  Occupational History   Not on file  Tobacco Use   Smoking status: Every Day    Packs/day: 0.50    Years: 10.00    Total pack years: 5.00    Types: Cigarettes   Smokeless tobacco: Never  Vaping Use   Vaping Use: Never used  Substance and Sexual Activity   Alcohol use: Yes    Alcohol/week: 2.0 standard drinks of alcohol    Types: 2 Standard  drinks or equivalent per week   Drug use: Never   Sexual activity: Not Currently  Other Topics Concern   Not on file  Social History Narrative   Not on file   Social Determinants of Health   Financial Resource Strain: Not on file  Food Insecurity: Not on file  Transportation Needs: Not on file  Physical Activity: Not on file  Stress: Not on file  Social Connections: Not on file    Tobacco Counseling Ready to quit: Not Answered Counseling given: Not Answered      Diabetic?no         Activities of Daily Living    05/13/2022    3:00 PM 10/30/2021    9:32 AM  In your present state of health, do you have any difficulty performing the following activities:  Hearing? 0 0  Vision? 0 0  Difficulty concentrating or making decisions? 0 0  Walking or climbing stairs? 0 0  Dressing or bathing? 0 0  Doing errands, shopping? 0 0    Patient Care Team: Lorrene Reid,  PA-C as PCP - General (Physician Assistant) Tanda Rockers, MD as Consulting Physician (Pulmonary Disease) Pauline Good, NP (Nurse Practitioner) Pauline Good, NP as Physician Assistant (Nurse Practitioner) Newt Minion, MD as Consulting Physician (Orthopedic Surgery)  Indicate any recent Medical Services you may have received from other than [facility] providers in the past year (date may be approximate).     Assessment:   This is a routine wellness examination for [facility].  Hearing/Vision screen No results found.  Dietary issues and exercise activities discussed: -Patient reports reduced fried foods. Currently doing PT for improving balance.    Goals Addressed   None   Depression Screen    09/07/2022    1:32 PM 05/13/2022    3:00 PM 10/30/2021    9:32 AM 05/09/2021    8:42 AM 12/31/2020   10:48 AM 11/13/2020    8:17 AM 04/02/2020   10:16 AM  PHQ 2/9 Scores  PHQ - 2 Score  0 0 0 0 2 0  PHQ- 9 Score  1 1 0 0 5 0  Exception Documentation Patient refusal          Fall Risk    09/07/2022    1:32 PM 10/30/2021    9:32 AM 05/09/2021    8:41 AM 12/31/2020   10:48 AM 11/13/2020    8:16 AM  Fall Risk   Falls in the past year? 0 0 1 0 0  Number falls in past yr: 0 0 0    Injury with Fall? 0 0 1    Risk for fall due to : No Fall Risks No Fall Risks History of fall(s)    Follow up Falls evaluation completed Falls evaluation completed Falls evaluation completed Falls evaluation completed Falls evaluation completed    [place]:  Any stairs in or around the home? Yes  If so, are there any without handrails? Yes  Home free of loose throw rugs in walkways, pet beds, electrical cords, etc? Yes  Adequate lighting in your home to reduce risk of falls? Yes   ASSISTIVE DEVICES UTILIZED TO PREVENT FALLS:  Life alert? No  Use of a cane, walker or w/c? No  Grab bars in the bathroom? No  Shower chair or bench in shower? Yes  Elevated toilet seat or a  handicapped toilet? No   TIMED UP AND GO:  Was the test performed? Yes .  Length of time to ambulate 10 feet: 10 sec.   Gait slow and steady without use of assistive device  Cognitive Function:    04/02/2020   10:17 AM  MMSE - Mini Mental State Exam  Orientation to time 3  Orientation to Place 5  Registration 3  Attention/ Calculation 5  Recall 3  Language- name 2 objects 2  Language- repeat 1  Language- follow 3 step command 2  Language- read & follow direction 1  Write a sentence 1  Copy design 0  Total score 26        09/07/2022    1:41 PM 05/09/2021    8:43 AM  6CIT Screen  What Year? 0 points 0 points  What month? 0 points 0 points  What time? 0 points 0 points  Count back from 20 0 points 0 points  Months in reverse 0 points 0 points  Repeat phrase 0 points 0 points  Total Score 0 points 0 points    Immunizations Immunization History  Administered Date(s) Administered   Influenza Split 10/24/2012   Influenza Whole 10/17/2010   Influenza, High Dose Seasonal PF 12/20/2014, 10/16/2019   Influenza,inj,Quad PF,6+ Mos 11/30/2013   PFIZER(Purple Top)SARS-COV-2 Vaccination 01/21/2020, 02/15/2020   Pneumococcal Conjugate-13 05/05/2017   Pneumococcal Polysaccharide-23 12/20/2014   Td 06/11/2009   Zoster Recombinat (Shingrix) 05/05/2017, 10/26/2017    TDAP status: Due, Education has been provided regarding the importance of this vaccine. Advised may receive this vaccine at local pharmacy or Health Dept. Aware to provide a copy of the vaccination record if obtained from local pharmacy or Health Dept. Verbalized acceptance and understanding.  Flu Vaccine status: Due, Education has been provided regarding the importance of this vaccine. Advised may receive this vaccine at local pharmacy or Health Dept. Aware to provide a copy of the vaccination record if obtained from local pharmacy or Health Dept. Verbalized acceptance and understanding.  Pneumococcal vaccine status:  Due, Education has been provided regarding the importance of this vaccine. Advised may receive this vaccine at local pharmacy or Health Dept. Aware to provide a copy of the vaccination record if obtained from local pharmacy or Health Dept. Verbalized acceptance and understanding.  Covid-19 vaccine status: Completed vaccines  Qualifies for Shingles Vaccine? Yes   Zostavax completed Yes   Shingrix Completed?: Yes  Screening Tests Health Maintenance  Topic Date Due   Hepatitis C Screening  Never done   COLONOSCOPY (Pts 45-4yrs Insurance coverage will need to be confirmed)  06/20/2017   TETANUS/TDAP  06/12/2019   COVID-19 Vaccine (3 - Pfizer series) 04/11/2020   INFLUENZA VACCINE  07/14/2022   Pneumonia Vaccine 65+ Years old  Completed   Zoster Vaccines- Shingrix  Completed   HPV VACCINES  Aged Out    Health Maintenance  Health Maintenance Due  Topic Date Due   Hepatitis C Screening  Never done   COLONOSCOPY (Pts 45-21yrs Insurance coverage will need to be confirmed)  06/20/2017   TETANUS/TDAP  06/12/2019   COVID-19 Vaccine (3 - Pfizer series) 04/11/2020   INFLUENZA VACCINE  07/14/2022     Colorectal cancer screening: Type of screening: Colonoscopy. Completed scheduled next month. Repeat every tbd years Lung Cancer Screening: (Low Dose CT Chest recommended if Age 59-80 years, 30 pack-year currently smoking OR have quit w/in 15years.) does qualify. Recommend discussing at f/up visit.   Lung Cancer Screening Referral: n/a  Additional Screening:  Hepatitis C Screening: does qualify; Completed pt declined  Vision Screening: Recommended annual ophthalmology exams for  early detection of glaucoma and other disorders of the eye. Is the patient up to date with their annual eye exam?  Yes  Who is the provider or what is the name of the office in which the patient attends annual eye exams? Dr Iraq If pt is not established with a provider, would they like to be referred to a  provider to establish care? No .   Dental Screening: Recommended annual dental exams for proper oral hygiene  Community Resource Referral / Chronic Care Management: CRR required this visit?  No   CCM required this visit?  No      Plan:   Patient has reduced tobacco use to 1/2 PPD from 1 PPD, recommend to continue to reduce use and eventually quit. Patient reports smoking for 20 yrs 1 PPD so has 20 pack yr hx and is a currently smoker so qualifies for low dose chest CT lung cancer screening, recommend discussing at f/up visit. BP elevated on intake and repeated with improvement and wnls (<140/90). Advised to forward information of where he will be having colonoscopy next month.  Continue to follow-up with psychiatrist.    I have personally reviewed and noted the following in the patient's chart:   Medical and social history Use of alcohol, tobacco or illicit drugs  Current medications and supplements including opioid prescriptions. Patient is not currently taking opioid prescriptions. Functional ability and status Nutritional status Physical activity Advanced directives List of other physicians Hospitalizations, surgeries, and ER visits in previous 12 months Vitals Screenings to include cognitive, depression, and falls Referrals and appointments  In addition, I have reviewed and discussed with patient certain preventive protocols, quality metrics, and best practice recommendations. A written personalized care plan for preventive services as well as general preventive health recommendations were provided to patient.   Mayer Masker, PA-C   09/07/2022

## 2022-09-10 ENCOUNTER — Encounter: Payer: Self-pay | Admitting: Physical Therapy

## 2022-09-10 ENCOUNTER — Ambulatory Visit: Payer: Medicare Other | Admitting: Physical Therapy

## 2022-09-10 DIAGNOSIS — R2689 Other abnormalities of gait and mobility: Secondary | ICD-10-CM | POA: Diagnosis not present

## 2022-09-10 DIAGNOSIS — M6281 Muscle weakness (generalized): Secondary | ICD-10-CM | POA: Diagnosis not present

## 2022-09-10 DIAGNOSIS — R293 Abnormal posture: Secondary | ICD-10-CM

## 2022-09-10 NOTE — Therapy (Signed)
OUTPATIENT PHYSICAL THERAPY TREATMENT NOTE   Patient Name: Cody Perez MRN: [id number] DOB:10-Oct-1951, 71 y.o., male Today's Date: 09/10/2022  PCP: Mayer Masker, PA-C   REFERRING PROVIDER: Netta Cedars, MD  END OF SESSION:   PT End of Session - 09/10/22 0931     Visit Number 4    Number of Visits 9    Date for PT Re-Evaluation 10/15/22    Authorization Type MCR A&B    Progress Note Due on Visit 10    PT Start Time 0930    PT Stop Time 1010    PT Time Calculation (min) 40 min              Past Medical History:  Diagnosis Date   Hyperlipidemia    Hypertension    History reviewed. No pertinent surgical history. 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 05/18/2008   Hyperlipidemia 03/03/2008   DEMENTIA 03/03/2008   ANXIETY 03/03/2008   Major depressive disorder, 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   PAIN:  Are you having pain? Yes, NPRS: 5/10  Pain  location: lower back, wraps around to sides Pain description:  ache Pain aggravators: first wake  Pain alleviating: gets better as day goes on, back extension    OBJECTIVE: (objective measures completed at initial evaluation unless otherwise dated) PATIENT SURVEYS:  FOTO 55%   MUSCLE LENGTH: Calf flexibility deficit   POSTURE:  Rounded shoulder and forward head posture   PALPATION: Non tender to palpation   LOWER EXTREMITY MMT:   MMT Right eval Left eval  Hip flexion 4 4  Hip extension 3 3  Hip abduction 3+ 4-  Knee flexion 4+ 4+  Knee extension * 4+  Ankle dorsiflexion 5 5  Ankle plantarflexion 3 2  Ankle inversion 2 2  Ankle eversion 4 4    *Patient unable to perform full active knee extension due to previous quad tendon repair, able to hold against resistance at lower range of knee flexion   FUNCTIONAL TESTS:  5 times sit to stand: 16 seconds  09/03/2022: 14 seconds 6 minute walk test: 610 ft. (rest break needed at 3 min for 45 seconds) BERG balance: 37/56 (see initial eval)   GAIT: Distance walked: 610 ft Assistive device utilized: None Level of assistance: Complete Independence Comments: Bilateral toe out, right quad avoidance with knee extension, unsteady gait    TODAY'S TREATMENT [facility] Adult PT Treatment:  DATE: 09/10/22 Therapeutic Exercise: Nustep L6 x 5 minutes with UE/LE while taking subjective Standing heel raises 2 x 20 (1 set with toes on airex) Standing hip abduction with red at knees 2 x 10 each Standing hip extension with red at knees 2 x 10 each Slant board calf stretch 2 x 30 sec Sit to stand 2 x 10 Bridge 2 x 10 Sidelying Clam 10 x 2  SLR x 10 left, x 5 right SAQ x 20 bilat  Hooklying clam blue band 5 sec holds x 20 Hoolying march with blue band   Neuro Reed: 3/4 tandem stance 2 x 30 sec each  on Airex Airex marching with 1 UE support   [facility] Adult PT Treatment:                                                 DATE: 09/03/22 Therapeutic Exercise: Nustep L6 x 5 minutes with UE/LE while taking subjective Slant board calf stretch 2 x 30 sec Standing heel raises 2 x 20 Standing hip abduction with red at knees 2 x 10 each Standing hip extension with red at knees 2 x 10 each Bridge 2 x 10 Hooklying clamshell with green 2 x 15 Supine alternating march with green 2 x 20 Sit to stand 2 x 10 Neuro Reed: 3/4 tandem stance 2 x 30 sec Romberg on Airex x 30 sec Romberg on Airex with head turns 3 x 10   [facility] Adult PT Treatment:                                                DATE: 08/27/22 Therapeutic Exercise: Nustep L4 x 5 minutes Standing heel raises x 15 Standing hip abduction 10 x 2  Standing hip ext 10 x 2  Supine marching Supine clam yellow x 20 Supine Bridge x 15, 5 sec  STS 10 x 2  1/2 tandem with head turns Rhomberg EC  Side stepping at counter  Alt March at counter with UE  Gastroc stretch at counter  [email or web address] Adult PT Treatment:                                                DATE: 08/20/22 Therapeutic Exercise: Sit to stand without UE support x 10 Romberg with head turns x 10 1/2 tandem stance x 30 sec each Standing hip abduction x 10 each Standing hip extension x 10 each Standing heel raises x 10 Standing calf stretch x 30 sec   PATIENT EDUCATION:  Education details: HEP Person educated: Patient Education method: Consulting civil engineer, Demonstration, Corporate treasurer cues, Verbal cues Education comprehension: verbalized understanding, returned demonstration, verbal cues required, tactile cues required, and needs further education   HOME EXERCISE PROGRAM: Access Code: [email or web address] URL: [name] Date: 09/10/2022 Prepared by: Hessie Diener  Exercises - Sit to Stand Without Arm Support  - 2 x daily - 10 reps - Romberg Stance with Head Rotation  - 2 x daily - 3 sets - 10 reps - Standing Romberg to 1/2 Tandem Stance  - 2 x daily - 3 sets - 30 seconds  hold - Standing Hip Abduction with Counter Support  - 2 x daily - 10 reps - Standing Hip Extension with Counter Support  - 2 x daily - 10 reps - Heel Raises with Counter Support  - 2 x daily - 10 reps - Standing Gastroc Stretch at Counter  - 2 x daily - 2 sets - 30 seconds hold - Beginner Clam  - 1 x daily - 7 x weekly - 2 sets - 10 reps     ASSESSMENT: CLINICAL IMPRESSION: Pt arrives today with reports of back pain that started his morning. He rates his pain at 5/10 however agrees to complete his LE strength and balance treatment. Patient tolerated therapy well with no adverse effects. Progressed mat based LE strengthening with good tolerance. His HEP was updated this visit. Patient would benefit from continued skilled PT to progress his mobility and strength in order improve gait and balance to maximize functional ability. He reported decreased back pain at end of session.      OBJECTIVE IMPAIRMENTS Abnormal gait, decreased activity tolerance, decreased balance, difficulty walking, decreased strength, impaired flexibility, postural dysfunction, and pain.    ACTIVITY LIMITATIONS standing, stairs, and locomotion level   PARTICIPATION LIMITATIONS: shopping and community activity   PERSONAL FACTORS Fitness, Past/current experiences, Time since onset of injury/illness/exacerbation, and 3+ comorbidities: see above  are also affecting patient's functional outcome.      GOALS: Goals reviewed with patient? Yes   SHORT TERM GOALS: Target date: 09/17/2022    Patient will be I with initial HEP in order to progress with therapy. Baseline: HEP provided at eval Goal status: INITIAL   2.  PT will review FOTO with patient by 3rd visit in order to understand expected progress and outcome with therapy. Baseline: FOTO assessed at eval Goal status: INITIAL   3.  Patient will perform 5xSTS in </= 12 seconds to indicate improved strength and reduced fall risk. Baseline: 16 seconds Goal status:  INITIAL   LONG TERM GOALS: Target date: 10/15/2022    Patient will be I with final HEP to maintain progress from PT. Baseline: HEP provided at eval Goal status: INITIAL   2.  Patient will report >/= 71% status on FOTO to indicate improved functional ability. Baseline: 55% functional status Goal status: INITIAL   3.  Patient will ambulate >/= 1000 ft. With in order to improve community ambulation. Baseline: 610 ft. Goal status: INITIAL   4.  Patient will demonstrate BERG >/= 45/56 in order to indicate improved balance and reduced fall risk. Baseline: 37/56 Goal status: INITIAL     PLAN: PT FREQUENCY: 1x/week   PT DURATION: 8 weeks   PLANNED INTERVENTIONS: Therapeutic exercises, Therapeutic activity, Neuromuscular re-education, Balance training, Gait training, Patient/Family education, Self Care, Joint mobilization, Joint manipulation, Aquatic Therapy, Dry Needling, Manual therapy, and Re-evaluation   PLAN FOR NEXT SESSION: Review HEP and progress PRN, progress LE strength, endurance, and balance training      Jannette Spanner, PTA 09/10/22 10:08 AM Phone: [phone] Fax: [phone]

## 2022-09-16 NOTE — Therapy (Signed)
OUTPATIENT PHYSICAL THERAPY TREATMENT NOTE   Patient Name: Cody Perez MRN: [id number] DOB:Apr 17, 1951, 71 y.o., male Today's Date: 09/17/2022  PCP: Lorrene Reid, PA-C   REFERRING PROVIDER: Armond Hang, MD   END OF SESSION:   PT End of Session - 09/17/22 0924     Visit Number 5    Number of Visits 9    Date for PT Re-Evaluation 10/15/22    Authorization Type MCR A&B    Progress Note Due on Visit 10    PT Start Time 0915    PT Stop Time 0955    PT Time Calculation (min) 40 min    Activity Tolerance Patient tolerated treatment well    Behavior During Therapy [facility] for tasks assessed/performed               Past Medical History:  Diagnosis Date   Hyperlipidemia    Hypertension    History reviewed. No pertinent surgical history. 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   PAIN:  Are you having pain? Yes,  NPRS: 3/10  Pain location: lower back, wraps around to sides Pain description:  ache Pain aggravators: first wake  Pain alleviating: gets better as day goes on, back extension   OBJECTIVE: (objective measures completed at initial evaluation unless otherwise dated) PATIENT SURVEYS:  FOTO 55% functional status  09/17/2022: 55%   MUSCLE LENGTH: Calf flexibility deficit   POSTURE:  Rounded shoulder and forward head posture   PALPATION: Non tender to palpation   LOWER EXTREMITY MMT:   MMT Right eval Left eval  Hip flexion 4 4  Hip extension 3 3  Hip abduction 3+ 4-  Knee flexion 4+ 4+  Knee extension * 4+  Ankle dorsiflexion 5 5  Ankle plantarflexion 3 2  Ankle inversion 2 2  Ankle eversion 4 4    *Patient unable to perform full active knee extension due to previous quad tendon repair, able to hold against resistance at lower range of knee flexion   FUNCTIONAL TESTS:  5 times sit to stand: 16 seconds  09/03/2022: 14 seconds  09/17/2022: 13 seconds 6 minute walk test: 610 ft. (rest break needed at 3 min for 45 seconds) BERG balance: 37/56 (see initial eval)   GAIT: Distance walked: 610 ft Assistive device utilized: None Level of assistance: Complete Independence Comments: Bilateral toe  out, right quad avoidance with knee extension, unsteady gait    TODAY'S TREATMENT [facility] Adult PT Treatment:                                                DATE: 09/17/22 Therapeutic Exercise: Nustep L6 x 5 minutes with UE/LE while taking subjective Slant board calf stretch 3 x 30 sec Standing heel raises 3 x 20 with toes on Airex Standing hip abduction with red at knees 2 x 15 each Bridge 2 x 10 SLR x 10 each - extension lag on right from previous surgery Sit to stand 2 x 10 Neuro Reed: Romberg on Airex with head turns 3 x 10 Rockerboard forward/backward taps 3 x 20 with bilateral UE support Airex marching with  single UE support 2 x 20   [facility] Adult PT Treatment:                                                DATE: 09/10/22 Therapeutic Exercise: Nustep L6 x 5 minutes with UE/LE while taking subjective Standing heel raises 2 x 20 (1 set with toes on airex) Standing hip abduction with red at knees 2 x 10 each Standing hip extension with red at knees 2 x 10 each Slant board calf stretch 2 x 30 sec Sit to stand 2 x 10 Bridge 2 x 10 Sidelying Clam 10 x 2  SLR x 10 left, x 5 right SAQ x 20 bilat  Hooklying clam blue band 5 sec holds x 20 Hoolying march with blue band  Neuro Reed: 3/4 tandem stance 2 x 30 sec each  on Airex Airex marching with 1 UE support   [facility] Adult PT Treatment:                                                DATE: 09/03/22 Therapeutic Exercise: Nustep L6 x 5 minutes with UE/LE while taking subjective Slant board calf stretch 2 x 30 sec Standing heel raises 2 x 20 Standing hip abduction with red at knees 2 x 10 each Standing hip extension with red at knees 2 x 10 each Bridge 2 x 10 Hooklying clamshell with green 2 x 15 Supine alternating march with green 2 x 20 Sit to stand 2 x 10 Neuro Reed: 3/4 tandem stance 2 x 30 sec Romberg on Airex x 30 sec Romberg on Airex with head turns 3 x 10   PATIENT EDUCATION:  Education details: HEP Person educated: Patient Education method: Consulting civil engineer, Demonstration, Corporate treasurer cues, Verbal cues Education comprehension: verbalized understanding, returned demonstration, verbal cues required, tactile cues required, and needs further education   HOME EXERCISE PROGRAM: Access Code: [email or web address]     ASSESSMENT: CLINICAL IMPRESSION: Patient tolerated therapy well with no adverse effects. Therapy focused on continued LE strengthening and balance training. He exhibits improvement in his 5xSTS but does continue to demonstrate balance and gait deficits and requires UE support with balance training. He reports same functional ability on FOTO. No  changes made to HEP this visit. Patient would benefit from continued skilled PT to progress his mobility and  strength in order improve gait and balance to maximize functional ability.      OBJECTIVE IMPAIRMENTS Abnormal gait, decreased activity tolerance, decreased balance, difficulty walking, decreased strength, impaired flexibility, postural dysfunction, and pain.    ACTIVITY LIMITATIONS standing, stairs, and locomotion level   PARTICIPATION LIMITATIONS: shopping and community activity   PERSONAL FACTORS Fitness, Past/current experiences, Time since onset of injury/illness/exacerbation, and 3+ comorbidities: see above  are also affecting patient's functional outcome.      GOALS: Goals reviewed with patient? Yes   SHORT TERM GOALS: Target date: 09/17/2022    Patient will be I with initial HEP in order to progress with therapy. Baseline: HEP provided at eval 09/17/2022: independent  Goal status: MET   2.  PT will review FOTO with patient by 3rd visit in order to understand expected progress and outcome with therapy. Baseline: FOTO assessed at eval 09/17/2022: reviewed Goal status: MET   3.  Patient will perform 5xSTS in </= 12 seconds to indicate improved strength and reduced fall risk. Baseline: 16 seconds 09/17/2022: 13 seconds Goal status: PARTIALLY MET   LONG TERM GOALS: Target date: 10/15/2022    Patient will be I with final HEP to maintain progress from PT. Baseline: HEP provided at eval Goal status: INITIAL   2.  Patient will report >/= 71% status on FOTO to indicate improved functional ability. Baseline: 55% functional status Goal status: INITIAL   3.  Patient will ambulate >/= 1000 ft. With 6MWT in order to improve community ambulation. Baseline: 610 ft. Goal status: INITIAL   4.  Patient will demonstrate BERG >/= 45/56 in order to indicate improved balance and reduced fall risk. Baseline: 37/56 Goal status: INITIAL     PLAN: PT FREQUENCY: 1x/week   PT  DURATION: 8 weeks   PLANNED INTERVENTIONS: Therapeutic exercises, Therapeutic activity, Neuromuscular re-education, Balance training, Gait training, Patient/Family education, Self Care, Joint mobilization, Joint manipulation, Aquatic Therapy, Dry Needling, Manual therapy, and Re-evaluation   PLAN FOR NEXT SESSION: Review HEP and progress PRN, progress LE strength, endurance, and balance training      Hilda Blades, PT, DPT, LAT, ATC 09/17/22  10:01 AM Phone: [phone] Fax: [phone] Fax: [phone]

## 2022-09-17 ENCOUNTER — Ambulatory Visit: Payer: Medicare Other | Attending: Orthopaedic Surgery | Admitting: Physical Therapy

## 2022-09-17 ENCOUNTER — Encounter: Payer: Self-pay | Admitting: Physical Therapy

## 2022-09-17 ENCOUNTER — Other Ambulatory Visit: Payer: Self-pay

## 2022-09-17 DIAGNOSIS — R2689 Other abnormalities of gait and mobility: Secondary | ICD-10-CM | POA: Insufficient documentation

## 2022-09-17 DIAGNOSIS — M6281 Muscle weakness (generalized): Secondary | ICD-10-CM | POA: Diagnosis not present

## 2022-09-28 ENCOUNTER — Ambulatory Visit: Payer: Medicare Other | Admitting: Physical Therapy

## 2022-09-28 ENCOUNTER — Encounter: Payer: Self-pay | Admitting: Physical Therapy

## 2022-09-28 ENCOUNTER — Other Ambulatory Visit: Payer: Self-pay

## 2022-09-28 DIAGNOSIS — R2689 Other abnormalities of gait and mobility: Secondary | ICD-10-CM

## 2022-09-28 DIAGNOSIS — M6281 Muscle weakness (generalized): Secondary | ICD-10-CM

## 2022-09-28 NOTE — Therapy (Signed)
OUTPATIENT PHYSICAL THERAPY TREATMENT NOTE  DISCHARGE   Patient Name: Cody Perez MRN: [id number] DOB:[birth year], 71 y.o., male Today's Date: 09/28/2022  PCP: Lorrene Reid, PA-C   REFERRING PROVIDER: Armond Hang, MD   END OF SESSION:   PT End of Session - 09/28/22 1009     Visit Number 6    Number of Visits 9    Date for PT Re-Evaluation 10/15/22    Authorization Type MCR A&B    Progress Note Due on Visit 10    PT Start Time 1000    PT Stop Time 1045    PT Time Calculation (min) 45 min    Activity Tolerance Patient tolerated treatment well    Behavior During Therapy WFL for tasks assessed/performed                Past Medical History:  Diagnosis Date   Hyperlipidemia    Hypertension    History reviewed. No pertinent surgical history. 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)- txed by psychiatry 03/03/2008   PERIPHERAL NEUROPATHY 03/03/2008   COPD GOLD II active smoker 03/03/2008    REFERRING DIAG: Pain in right foot   THERAPY DIAG:  Other abnormalities of gait and mobility  Muscle weakness (generalized)  Rationale for Evaluation and Treatment Rehabilitation  PERTINENT HISTORY: Right quad muscle rupture and repair in 2015   PRECAUTIONS:  Fall    SUBJECTIVE: Patient  reports his back has been killing him recently, which has made walking walking more difficult. He does note overall improvement in his walking ability and balance since the start of therapy, but today his back is limiting him more.  PAIN:  Are you having pain? Yes,  NPRS: 7/10  Pain location: lower back, wraps around to sides Pain description:  ache Pain aggravators: first wake  Pain alleviating: gets better as day goes on, back extension   OBJECTIVE: (objective measures completed at initial evaluation unless otherwise dated) PATIENT SURVEYS:  FOTO 55% functional status  09/17/2022: 55%  09/28/2022: 51%   MUSCLE LENGTH: Calf flexibility deficit   POSTURE:  Rounded shoulder and forward head posture   PALPATION: Non tender to palpation   LOWER EXTREMITY MMT:   MMT Right eval Left eval  Hip flexion 4 4  Hip extension 3 3  Hip abduction 3+ 4-  Knee flexion 4+ 4+  Knee extension * 4+  Ankle dorsiflexion 5 5  Ankle plantarflexion 3 2  Ankle inversion 2 2  Ankle eversion 4 4    *Patient unable to perform full active knee extension due to previous quad tendon repair, able to hold against resistance at lower range of knee flexion   FUNCTIONAL TESTS:  5 times sit to stand: 16 seconds  09/03/2022: 14 seconds  09/17/2022: 13 seconds  09/28/2022: 13 seconds  6 minute walk test: 610 ft. (rest break needed at 3 min for 45 seconds)  09/28/2022: 875 ft (no rest break required) BERG balance: 37/56 (see initial eval)  09/28/2022: 39/45  BERG BALANCE TEST Sitting to Standing: 4.      Stands without using hands and stabilize independently Standing Unsupported: 4.      Stands safely for 2 minutes Sitting Unsupported: 4.     Sits for 2 minutes independently Standing to Sitting: 4.     Sits safely with minimal use of hands Transfers: 4.     Transfers safely with minor use of hands Standing with eyes closed: 2.     Able to stand for 3 seconds Standing with feet together: 3.     Stands  for 1 minute with supervision Reaching forward with outstretched arm: 4.     Reaches forward 10 inches Retrieving object from the floor: 4.      Able to pick up easily and safely Turning to look behind: 3.     Looks behind one side only, other side less weight shift Turning 360 degrees: 2.     Able to turn slowly, but safely Place alternate foot on stool: 1.     Completes >2 steps with minimal assist Standing with one foot in front: 0.     Loses balance while standing/stepping Standing on one foot: 0.     Unable Total Score: 39/56  GAIT: Distance walked: 610 ft  09/28/2022: 875 ft Assistive device utilized: None Level of assistance: Complete Independence Comments: Bilateral toe out, right quad avoidance with knee extension, unsteady gait    TODAY'S TREATMENT [facility] Adult PT Treatment:                                                DATE: 09/28/22 Therapeutic Exercise: Nustep L6 x 5 minutes with UE/LE while taking subjective Hooklying SKTC stretch 3 x 20 sec each LTR x 10 Piriformis stretch 2 x 20 sec each Seated lumbar flexion physioball rollout 5 x 10 sec Sit to stand 2 x 10 Row with green 2 x 10 Standing heel raises 2 x 15 Standing hip abduction with red at knees 2 x 15 each Neuro Reed: Rockerboard forward/backward taps 3 x 20 with bilateral UE support Airex marching with single UE support 2 x 20   [facility] Adult PT Treatment:                                                DATE: 09/17/22 Therapeutic Exercise: Nustep L6 x 5 minutes with UE/LE while taking subjective Slant board calf stretch 3 x 30 sec Standing heel raises 3 x 20 with toes on Airex Standing hip abduction with red at knees 2 x 15 each Bridge 2 x 10 SLR x 10 each - extension lag on right from previous surgery Sit to stand 2 x 10 Neuro Reed: Romberg on Airex with head turns 3 x 10 Rockerboard forward/backward taps 3 x 20 with bilateral UE support Airex marching with single UE support 2 x 20  [facility] Adult PT  Treatment:  DATE: 09/10/22 Therapeutic Exercise: Nustep L6 x 5 minutes with UE/LE while taking subjective Standing heel raises 2 x 20 (1 set with toes on airex) Standing hip abduction with red at knees 2 x 10 each Standing hip extension with red at knees 2 x 10 each Slant board calf stretch 2 x 30 sec Sit to stand 2 x 10 Bridge 2 x 10 Sidelying Clam 10 x 2  SLR x 10 left, x 5 right SAQ x 20 bilat  Hooklying clam blue band 5 sec holds x 20 Hoolying march with blue band  Neuro Reed: 3/4 tandem stance 2 x 30 sec each  on Airex Airex marching with 1 UE support    PATIENT EDUCATION:  Education details: POC discharge, HEP Person educated: Patient Education method: Consulting civil engineer, Demonstration Education comprehension: verbalized understanding, returned demonstration   HOME EXERCISE PROGRAM: Access Code: [email or web address]     ASSESSMENT: CLINICAL IMPRESSION: Patient tolerated therapy well with no adverse effects. He arrived reporting increased low back pain so incorporated stretching for lumbar region with good tolerance. He did demonstrate an improvement in walking ability and balance but still with limitations. He reports reduction in functional ability on FOTO but he did report low back pain could have affected answers. Patient elects to be discharged from therapy due to feeling of improvement with his walking and feet, so will be formally discharged to independent exercise program.     OBJECTIVE IMPAIRMENTS Abnormal gait, decreased activity tolerance, decreased balance, difficulty walking, decreased strength, impaired flexibility, postural dysfunction, and pain.    ACTIVITY LIMITATIONS standing, stairs, and locomotion level   PARTICIPATION LIMITATIONS: shopping and community activity   PERSONAL FACTORS Fitness, Past/current experiences, Time since onset of injury/illness/exacerbation, and 3+ comorbidities: see above  are also affecting patient's  functional outcome.      GOALS: Goals reviewed with patient? Yes   SHORT TERM GOALS: Target date: 09/17/2022    Patient will be I with initial HEP in order to progress with therapy. Baseline: HEP provided at eval 09/17/2022: independent  Goal status: MET   2.  PT will review FOTO with patient by 3rd visit in order to understand expected progress and outcome with therapy. Baseline: FOTO assessed at eval 09/17/2022: reviewed Goal status: MET   3.  Patient will perform 5xSTS in </= 12 seconds to indicate improved strength and reduced fall risk. Baseline: 16 seconds 09/17/2022: 13 seconds 09/28/2022: 13 seconds Goal status: PARTIALLY MET   LONG TERM GOALS: Target date: 10/15/2022    Patient will be I with final HEP to maintain progress from PT. Baseline: HEP provided at eval 09/28/2022: independent with current HEP Goal status: MET   2.  Patient will report >/= 71% status on FOTO to indicate improved functional ability. Baseline: 55% functional status 09/28/2022: 51% Goal status: NOT MET   3.  Patient will ambulate >/= 1000 ft. With 6MWT in order to improve community ambulation. Baseline: 610 ft. 09/28/2022: 875 ft Goal status: PARTIALLY MET   4.  Patient will demonstrate BERG >/= 45/56 in order to indicate improved balance and reduced fall risk. Baseline: 37/56 09/28/2022: 39/56 Goal status: PARTIALLY MET     PLAN: PT FREQUENCY: 1x/week   PT DURATION: 8 weeks   PLANNED INTERVENTIONS: Therapeutic exercises, Therapeutic activity, Neuromuscular re-education, Balance training, Gait training, Patient/Family education, Self Care, Joint mobilization, Joint manipulation, Aquatic Therapy, Dry Needling, Manual therapy, and Re-evaluation   PLAN FOR NEXT SESSION: NA - discharge      Hilda Blades, PT, DPT, LAT,  ATC 09/28/22  11:42 AM Phone: [phone] Fax: [phone]    PHYSICAL THERAPY DISCHARGE SUMMARY  Visits from Start of Care: 6  Current functional level  related to goals / functional outcomes: See above   Remaining deficits: See above   Education / Equipment: HEP   Patient agrees to discharge. Patient goals were not met. Patient is being discharged due to being pleased with the current functional level.

## 2022-10-07 DIAGNOSIS — Z23 Encounter for immunization: Secondary | ICD-10-CM | POA: Diagnosis not present

## 2022-10-08 DIAGNOSIS — F3176 Bipolar disorder, in full remission, most recent episode depressed: Secondary | ICD-10-CM | POA: Diagnosis not present

## 2022-10-08 DIAGNOSIS — F411 Generalized anxiety disorder: Secondary | ICD-10-CM | POA: Diagnosis not present

## 2022-10-31 ENCOUNTER — Other Ambulatory Visit: Payer: Self-pay | Admitting: Physician Assistant

## 2022-10-31 DIAGNOSIS — I10 Essential (primary) hypertension: Secondary | ICD-10-CM

## 2022-10-31 DIAGNOSIS — E7800 Pure hypercholesterolemia, unspecified: Secondary | ICD-10-CM

## 2023-01-29 ENCOUNTER — Other Ambulatory Visit: Payer: Self-pay | Admitting: Nurse Practitioner

## 2023-01-29 DIAGNOSIS — E7800 Pure hypercholesterolemia, unspecified: Secondary | ICD-10-CM

## 2023-02-11 ENCOUNTER — Emergency Department (HOSPITAL_COMMUNITY): Payer: Medicare Other

## 2023-02-11 ENCOUNTER — Inpatient Hospital Stay (HOSPITAL_COMMUNITY)
Admission: EM | Admit: 2023-02-11 | Discharge: 2023-02-12 | DRG: 963 | Disposition: E | Payer: Medicare Other | Attending: General Surgery | Admitting: General Surgery

## 2023-02-11 ENCOUNTER — Inpatient Hospital Stay (HOSPITAL_COMMUNITY): Payer: Medicare Other

## 2023-02-11 ENCOUNTER — Encounter (HOSPITAL_COMMUNITY): Payer: Self-pay | Admitting: General Surgery

## 2023-02-11 DIAGNOSIS — S72002A Fracture of unspecified part of neck of left femur, initial encounter for closed fracture: Secondary | ICD-10-CM | POA: Diagnosis present

## 2023-02-11 DIAGNOSIS — S065XAA Traumatic subdural hemorrhage with loss of consciousness status unknown, initial encounter: Principal | ICD-10-CM | POA: Diagnosis present

## 2023-02-11 DIAGNOSIS — R918 Other nonspecific abnormal finding of lung field: Secondary | ICD-10-CM | POA: Diagnosis not present

## 2023-02-11 DIAGNOSIS — R578 Other shock: Secondary | ICD-10-CM | POA: Diagnosis present

## 2023-02-11 DIAGNOSIS — S02609A Fracture of mandible, unspecified, initial encounter for closed fracture: Secondary | ICD-10-CM | POA: Diagnosis present

## 2023-02-11 DIAGNOSIS — J439 Emphysema, unspecified: Secondary | ICD-10-CM | POA: Diagnosis not present

## 2023-02-11 DIAGNOSIS — J9601 Acute respiratory failure with hypoxia: Secondary | ICD-10-CM | POA: Diagnosis present

## 2023-02-11 DIAGNOSIS — S36030A Superficial (capsular) laceration of spleen, initial encounter: Secondary | ICD-10-CM | POA: Diagnosis present

## 2023-02-11 DIAGNOSIS — M4156 Other secondary scoliosis, lumbar region: Secondary | ICD-10-CM | POA: Diagnosis present

## 2023-02-11 DIAGNOSIS — S42101A Fracture of unspecified part of scapula, right shoulder, initial encounter for closed fracture: Secondary | ICD-10-CM | POA: Diagnosis present

## 2023-02-11 DIAGNOSIS — R402242 Coma scale, best verbal response, confused conversation, at arrival to emergency department: Secondary | ICD-10-CM | POA: Diagnosis present

## 2023-02-11 DIAGNOSIS — R402362 Coma scale, best motor response, obeys commands, at arrival to emergency department: Secondary | ICD-10-CM | POA: Diagnosis present

## 2023-02-11 DIAGNOSIS — Z79899 Other long term (current) drug therapy: Secondary | ICD-10-CM

## 2023-02-11 DIAGNOSIS — J449 Chronic obstructive pulmonary disease, unspecified: Secondary | ICD-10-CM | POA: Diagnosis present

## 2023-02-11 DIAGNOSIS — N200 Calculus of kidney: Secondary | ICD-10-CM | POA: Diagnosis not present

## 2023-02-11 DIAGNOSIS — I10 Essential (primary) hypertension: Secondary | ICD-10-CM | POA: Diagnosis present

## 2023-02-11 DIAGNOSIS — S32020A Wedge compression fracture of second lumbar vertebra, initial encounter for closed fracture: Secondary | ICD-10-CM | POA: Diagnosis present

## 2023-02-11 DIAGNOSIS — Z7982 Long term (current) use of aspirin: Secondary | ICD-10-CM

## 2023-02-11 DIAGNOSIS — S42102A Fracture of unspecified part of scapula, left shoulder, initial encounter for closed fracture: Secondary | ICD-10-CM | POA: Diagnosis present

## 2023-02-11 DIAGNOSIS — R0902 Hypoxemia: Secondary | ICD-10-CM | POA: Diagnosis not present

## 2023-02-11 DIAGNOSIS — E785 Hyperlipidemia, unspecified: Secondary | ICD-10-CM | POA: Diagnosis present

## 2023-02-11 DIAGNOSIS — S271XXA Traumatic hemothorax, initial encounter: Secondary | ICD-10-CM | POA: Diagnosis present

## 2023-02-11 DIAGNOSIS — R402142 Coma scale, eyes open, spontaneous, at arrival to emergency department: Secondary | ICD-10-CM | POA: Diagnosis present

## 2023-02-11 DIAGNOSIS — S72142A Displaced intertrochanteric fracture of left femur, initial encounter for closed fracture: Secondary | ICD-10-CM | POA: Diagnosis not present

## 2023-02-11 DIAGNOSIS — S61411A Laceration without foreign body of right hand, initial encounter: Secondary | ICD-10-CM | POA: Diagnosis present

## 2023-02-11 DIAGNOSIS — R58 Hemorrhage, not elsewhere classified: Secondary | ICD-10-CM | POA: Diagnosis not present

## 2023-02-11 DIAGNOSIS — S2243XA Multiple fractures of ribs, bilateral, initial encounter for closed fracture: Secondary | ICD-10-CM | POA: Diagnosis present

## 2023-02-11 DIAGNOSIS — R000 Tachycardia, unspecified: Secondary | ICD-10-CM | POA: Diagnosis not present

## 2023-02-11 DIAGNOSIS — F17210 Nicotine dependence, cigarettes, uncomplicated: Secondary | ICD-10-CM | POA: Diagnosis present

## 2023-02-11 DIAGNOSIS — T07XXXA Unspecified multiple injuries, initial encounter: Secondary | ICD-10-CM | POA: Diagnosis not present

## 2023-02-11 DIAGNOSIS — M79641 Pain in right hand: Secondary | ICD-10-CM | POA: Diagnosis not present

## 2023-02-11 DIAGNOSIS — F0390 Unspecified dementia without behavioral disturbance: Secondary | ICD-10-CM | POA: Diagnosis present

## 2023-02-11 DIAGNOSIS — N281 Cyst of kidney, acquired: Secondary | ICD-10-CM | POA: Diagnosis not present

## 2023-02-11 DIAGNOSIS — S065X0A Traumatic subdural hemorrhage without loss of consciousness, initial encounter: Secondary | ICD-10-CM | POA: Diagnosis not present

## 2023-02-11 DIAGNOSIS — S32018A Other fracture of first lumbar vertebra, initial encounter for closed fracture: Secondary | ICD-10-CM | POA: Diagnosis not present

## 2023-02-11 DIAGNOSIS — S32010A Wedge compression fracture of first lumbar vertebra, initial encounter for closed fracture: Secondary | ICD-10-CM | POA: Diagnosis present

## 2023-02-11 DIAGNOSIS — S22079A Unspecified fracture of T9-T10 vertebra, initial encounter for closed fracture: Secondary | ICD-10-CM | POA: Diagnosis present

## 2023-02-11 DIAGNOSIS — S32019A Unspecified fracture of first lumbar vertebra, initial encounter for closed fracture: Secondary | ICD-10-CM | POA: Diagnosis not present

## 2023-02-11 DIAGNOSIS — R079 Chest pain, unspecified: Secondary | ICD-10-CM | POA: Diagnosis not present

## 2023-02-11 DIAGNOSIS — S32000A Wedge compression fracture of unspecified lumbar vertebra, initial encounter for closed fracture: Secondary | ICD-10-CM

## 2023-02-11 DIAGNOSIS — M47812 Spondylosis without myelopathy or radiculopathy, cervical region: Secondary | ICD-10-CM | POA: Diagnosis not present

## 2023-02-11 DIAGNOSIS — I9589 Other hypotension: Secondary | ICD-10-CM | POA: Diagnosis not present

## 2023-02-11 DIAGNOSIS — S32028A Other fracture of second lumbar vertebra, initial encounter for closed fracture: Secondary | ICD-10-CM | POA: Diagnosis not present

## 2023-02-11 DIAGNOSIS — Y92410 Unspecified street and highway as the place of occurrence of the external cause: Secondary | ICD-10-CM | POA: Diagnosis not present

## 2023-02-11 DIAGNOSIS — S2220XA Unspecified fracture of sternum, initial encounter for closed fracture: Secondary | ICD-10-CM | POA: Diagnosis present

## 2023-02-11 DIAGNOSIS — T1490XA Injury, unspecified, initial encounter: Secondary | ICD-10-CM | POA: Diagnosis not present

## 2023-02-11 DIAGNOSIS — J9811 Atelectasis: Secondary | ICD-10-CM | POA: Diagnosis not present

## 2023-02-11 DIAGNOSIS — S22078A Other fracture of T9-T10 vertebra, initial encounter for closed fracture: Secondary | ICD-10-CM

## 2023-02-11 DIAGNOSIS — I469 Cardiac arrest, cause unspecified: Secondary | ICD-10-CM | POA: Diagnosis not present

## 2023-02-11 LAB — I-STAT CHEM 8, ED
BUN: 11 mg/dL (ref 8–23)
Calcium, Ion: 1.13 mmol/L — ABNORMAL LOW (ref 1.15–1.40)
Chloride: 101 mmol/L (ref 98–111)
Creatinine, Ser: 1.4 mg/dL — ABNORMAL HIGH (ref 0.61–1.24)
Glucose, Bld: 198 mg/dL — ABNORMAL HIGH (ref 70–99)
HCT: 36 % — ABNORMAL LOW (ref 39.0–52.0)
Hemoglobin: 12.2 g/dL — ABNORMAL LOW (ref 13.0–17.0)
Potassium: 4 mmol/L (ref 3.5–5.1)
Sodium: 135 mmol/L (ref 135–145)
TCO2: 23 mmol/L (ref 22–32)

## 2023-02-11 LAB — PROTIME-INR
INR: 1.6 — ABNORMAL HIGH (ref 0.8–1.2)
Prothrombin Time: 18.9 seconds — ABNORMAL HIGH (ref 11.4–15.2)

## 2023-02-11 LAB — HEMOGLOBIN AND HEMATOCRIT, BLOOD
HCT: 25.3 % — ABNORMAL LOW (ref 39.0–52.0)
Hemoglobin: 8.4 g/dL — ABNORMAL LOW (ref 13.0–17.0)

## 2023-02-11 LAB — BPAM PLATELET PHERESIS
Blood Product Expiration Date: 202403012359
Blood Product Expiration Date: 202403022359
ISSUE DATE / TIME: 202402291640
ISSUE DATE / TIME: 202402291640
Unit Type and Rh: 6200
Unit Type and Rh: 6200

## 2023-02-11 LAB — I-STAT ARTERIAL BLOOD GAS, ED
Acid-base deficit: 7 mmol/L — ABNORMAL HIGH (ref 0.0–2.0)
Bicarbonate: 21 mmol/L (ref 20.0–28.0)
Calcium, Ion: >2.5 mmol/L (ref 1.15–1.40)
HCT: 17 % — ABNORMAL LOW (ref 39.0–52.0)
Hemoglobin: 5.8 g/dL — CL (ref 13.0–17.0)
O2 Saturation: 59 %
Potassium: 3.1 mmol/L — ABNORMAL LOW (ref 3.5–5.1)
Sodium: 127 mmol/L — ABNORMAL LOW (ref 135–145)
TCO2: 23 mmol/L (ref 22–32)
pCO2 arterial: 61.4 mmHg — ABNORMAL HIGH (ref 32–48)
pH, Arterial: 7.141 — CL (ref 7.35–7.45)
pO2, Arterial: 40 mmHg — CL (ref 83–108)

## 2023-02-11 LAB — PREPARE CRYOPRECIPITATE: Unit division: 0

## 2023-02-11 LAB — BPAM CRYOPRECIPITATE
Blood Product Expiration Date: 202403022359
ISSUE DATE / TIME: 202402291638
Unit Type and Rh: 6200

## 2023-02-11 LAB — PREPARE PLATELET PHERESIS
Unit division: 0
Unit division: 0

## 2023-02-11 LAB — COMPREHENSIVE METABOLIC PANEL
ALT: 37 U/L (ref 0–44)
AST: 93 U/L — ABNORMAL HIGH (ref 15–41)
Albumin: 3.1 g/dL — ABNORMAL LOW (ref 3.5–5.0)
Alkaline Phosphatase: 98 U/L (ref 38–126)
Anion gap: 16 — ABNORMAL HIGH (ref 5–15)
BUN: 10 mg/dL (ref 8–23)
CO2: 18 mmol/L — ABNORMAL LOW (ref 22–32)
Calcium: 8.9 mg/dL (ref 8.9–10.3)
Chloride: 101 mmol/L (ref 98–111)
Creatinine, Ser: 1.47 mg/dL — ABNORMAL HIGH (ref 0.61–1.24)
GFR, Estimated: 50 mL/min — ABNORMAL LOW (ref >60)
Glucose, Bld: 206 mg/dL — ABNORMAL HIGH (ref 70–99)
Potassium: 4 mmol/L (ref 3.5–5.1)
Sodium: 135 mmol/L (ref 135–145)
Total Bilirubin: 0.8 mg/dL (ref 0.3–1.2)
Total Protein: 6.3 g/dL — ABNORMAL LOW (ref 6.5–8.1)

## 2023-02-11 LAB — CBC
HCT: 36.3 % — ABNORMAL LOW (ref 39.0–52.0)
Hemoglobin: 12.1 g/dL — ABNORMAL LOW (ref 13.0–17.0)
MCH: 35.2 pg — ABNORMAL HIGH (ref 26.0–34.0)
MCHC: 33.3 g/dL (ref 30.0–36.0)
MCV: 105.5 fL — ABNORMAL HIGH (ref 80.0–100.0)
Platelets: 157 10*3/uL (ref 150–400)
RBC: 3.44 MIL/uL — ABNORMAL LOW (ref 4.22–5.81)
RDW: 13.9 % (ref 11.5–15.5)
WBC: 19.5 10*3/uL — ABNORMAL HIGH (ref 4.0–10.5)
nRBC: 0 % (ref 0.0–0.2)

## 2023-02-11 LAB — ABO/RH: ABO/RH(D): O POS

## 2023-02-11 LAB — TRAUMA TEG PANEL
CFF Max Amplitude: 9.4 mm — ABNORMAL LOW (ref 15–32)
Citrated Kaolin (R): 6.7 min (ref 4.6–9.1)
Citrated Rapid TEG (MA): <40 mm — ABNORMAL LOW (ref 52–70)
Lysis at 30 Minutes: 0 % (ref 0.0–2.6)

## 2023-02-11 LAB — ETHANOL: Alcohol, Ethyl (B): <10 mg/dL (ref <10)

## 2023-02-11 LAB — PREPARE RBC (CROSSMATCH)

## 2023-02-11 MED ORDER — SODIUM CHLORIDE 0.9% IV SOLUTION: Freq: Once | INTRAVENOUS | Status: DC

## 2023-02-11 MED ORDER — PROPOFOL 1000 MG/100ML IV EMUL: 5.0000 ug/kg/min | INTRAVENOUS | Status: DC

## 2023-02-11 MED ORDER — EPINEPHRINE 1 MG/10ML IJ SOSY
PREFILLED_SYRINGE | INTRAMUSCULAR | Status: AC | PRN
  Administered 2023-02-11 (×2): 1 mg via INTRAVENOUS

## 2023-02-11 MED ORDER — VECURONIUM BROMIDE 10 MG IV SOLR
INTRAVENOUS | Status: AC
  Filled 2023-02-11: qty 10

## 2023-02-11 MED ORDER — PROPOFOL 1000 MG/100ML IV EMUL
INTRAVENOUS | Status: AC
  Administered 2023-02-11: 50 ug/kg/min via INTRAVENOUS
  Filled 2023-02-11: qty 100

## 2023-02-11 MED ORDER — EPINEPHRINE 1 MG/10ML IJ SOSY
PREFILLED_SYRINGE | INTRAMUSCULAR | Status: AC | PRN
  Administered 2023-02-11: 1 mg via INTRAVENOUS

## 2023-02-11 MED ORDER — IOHEXOL 350 MG/ML SOLN
75.0000 mL | Freq: Once | INTRAVENOUS | Status: AC | PRN
  Administered 2023-02-11: 75 mL via INTRAVENOUS

## 2023-02-11 MED ORDER — FENTANYL 2500MCG IN NS 250ML (10MCG/ML) PREMIX INFUSION
25.0000 ug/h | INTRAVENOUS | Status: DC
  Administered 2023-02-11: 100 ug/h via INTRAVENOUS
  Filled 2023-02-11: qty 250

## 2023-02-11 MED ORDER — EPINEPHRINE 1 MG/10ML IJ SOSY
PREFILLED_SYRINGE | INTRAMUSCULAR | Status: AC
  Filled 2023-02-11: qty 10

## 2023-02-11 MED ORDER — FENTANYL BOLUS VIA INFUSION: 25.0000 ug | INTRAVENOUS | Status: DC | PRN

## 2023-02-11 MED ORDER — FENTANYL CITRATE PF 50 MCG/ML IJ SOSY
25.0000 ug | PREFILLED_SYRINGE | Freq: Once | INTRAMUSCULAR | Status: DC
  Filled 2023-02-11: qty 1

## 2023-02-11 MED ORDER — ATROPINE SULFATE 1 MG/10ML IJ SOSY
PREFILLED_SYRINGE | INTRAMUSCULAR | Status: AC | PRN
  Administered 2023-02-11: 1 mg via INTRAVENOUS

## 2023-02-11 MED ORDER — CALCIUM GLUCONATE-NACL 1-0.675 GM/50ML-% IV SOLN
1.0000 g | Freq: Once | INTRAVENOUS | Status: AC
  Administered 2023-02-11: 1000 mg via INTRAVENOUS

## 2023-02-12 LAB — PREPARE FRESH FROZEN PLASMA
Unit division: 0
Unit division: 0
Unit division: 0
Unit division: 0

## 2023-02-12 LAB — BPAM FFP
Blood Product Expiration Date: 202403012359
Blood Product Expiration Date: 202403012359
Blood Product Expiration Date: 202403042359
Blood Product Expiration Date: 202403042359
ISSUE DATE / TIME: 202402291234
ISSUE DATE / TIME: 202402291234
ISSUE DATE / TIME: 202402291308
ISSUE DATE / TIME: 202402291308
Unit Type and Rh: 6200
Unit Type and Rh: 6200
Unit Type and Rh: 6200
Unit Type and Rh: 6200

## 2023-02-12 LAB — BLOOD PRODUCT ORDER (VERBAL) VERIFICATION

## 2023-02-12 NOTE — Code Documentation (Addendum)
Patient time of death occurred at 81. Dr.Burke [place] Silos called time of death.

## 2023-02-12 NOTE — Progress Notes (Signed)
Patient ID: Cody Perez, male   DOB: Feb 11, 1951, 72 y.o.   MRN: [id number] PEA arrest again. Given CPR, multiple rounds of meds, blood. No ROSC. Time of death 77. Wife is at bedside.  Georganna Skeans, MD, MPH, FACS Please use [email or web address] to contact on call provider

## 2023-02-12 NOTE — Progress Notes (Signed)
   2023-02-28 1700  Spiritual Encounters  Type of Visit Initial  Care provided to: [facility] partners present during encounter Nurse  Referral source Trauma page  Reason for visit Patient death  OnCall Visit No   Ch responded to page from ED to provide emotional and spiritual support to family. Ch provided compassionate touch and a calming presence. Assisted family in processing the loss. This Ch was relieved by on-call [place] to continue care.

## 2023-02-12 NOTE — Consult Note (Signed)
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 MAXILLOFACIAL WITHOUT CONTRAST CT CERVICAL SPINE WITHOUT CONTRAST TECHNIQUE: Multidetector CT imaging of the head, cervical spine, and maxillofacial structures were performed using the standard protocol without intravenous  contrast. Multiplanar CT image reconstructions of the cervical spine and maxillofacial structures were also generated. RADIATION DOSE REDUCTION: This exam was performed according to the departmental dose-optimization program which includes automated exposure control, adjustment of the mA and/or kV according to patient size and/or use of iterative reconstruction technique. COMPARISON:  Head CT 04/02/2009 FINDINGS: CT HEAD FINDINGS The study is moderately motion degraded. Brain: A small subdural hematoma over the posterior right cerebral convexity measures up to 4 mm in thickness without mass effect. Hypodensity in the parieto-occipital regions bilaterally is favored to be secondary to motion artifact and volume averaging through enlarged sulci in the setting of cerebral atrophy. No gross acute large territory infarct is identified. There is no  midline shift or hydrocephalus. Vascular: Calcified atherosclerosis at the skull base. Skull: Limited assessment due to motion. No displaced skull fracture. Other: None. CT MAXILLOFACIAL FINDINGS The study is severely motion degraded. Osseous: Mildly displaced fracture of the left mandibular body. Displaced fractures of the right mandibular ramus/coronoid and coracoid processes with the main condyle fragment being located anterior to the temporomandibular joint. Severely limited assessment for additional fractures due to the degree of motion. Orbits: No gross orbital hematoma. Sinuses: The paranasal sinuses, mastoid air cells, and middle ear cavities are grossly clear. Soft tissues: Limited assessment due to motion. Gas in the soft tissues adjacent to the right-sided mandible fractures. CT CERVICAL SPINE FINDINGS There is moderate to severe motion artifact, predominantly through the mid cervical spine, despite repeat imaging. Alignment: Left convex curvature of the cervical spine. No gross subluxation. Skull base and vertebrae: No acute fracture is identified within limitations of motion artifact. Soft tissues and spinal canal: No gross prevertebral fluid or visible canal hematoma. Disc levels: Disc degeneration most advanced at C5-6. Severe right facet arthrosis at C3-4. Facet ankylosis and possible partial interbody ankylosis at C4-5. Upper chest: Reported on contemporaneous chest CT. Other: None. Critical Value/emergent results were called by telephone at the time of interpretation on 04-Mar-2023 at 1:52 pm to Dr. Georganna Skeans, who verbally acknowledged these results. IMPRESSION: 1. Severely motion degraded examinations. 2. Small subdural hematoma over the posterior right cerebral convexity without mass effect. 3. Bilateral mandible fractures. 4. No gross acute cervical spine fracture. Electronically Signed   By: Logan Bores M.D.   On: March 04, 2023 13:57   DG Chest Port 1 View  Result Date: 2023/03/04 CLINICAL  DATA:  Trauma EXAM: PORTABLE CHEST 1 VIEW COMPARISON:  Previous chest radiograph done on 04/02/2020 FINDINGS: Cardiac size is within normal limits. There are no signs of pulmonary edema. There are linear densities in right lower lung field which may suggest crowding of markings due to poor inspiration and supine position or early infiltrate. There is no evidence of any pleural effusion or pneumothorax. Left hemidiaphragm is elevated. Left lateral CP angle is not included in the radiograph. Tip of endotracheal tube is 5.5 cm above the carina. Enteric tube is noted traversing the esophagus. IMPRESSION: Increased markings in the right lower lung fields may suggest crowding of bronchovascular structures due to poor inspiration and supine position or suggest atelectasis/contusion. Electronically Signed   By: Elmer Picker M.D.   On: 03/04/23 13:07   DG Pelvis Portable  Result Date: 03-04-2023 CLINICAL DATA:  Blunt polytrauma EXAM: PORTABLE PELVIS 1-2 VIEWS COMPARISON:  None Available. FINDINGS: Acute displaced left proximal femur intertrochanteric fracture with the distal fragment displaced about 2.3 cm proximal-lateral. No other acute fracture of the visualized bony pelvis is identified. There is evidence of loss of  disc height and intervertebral spurring at L4-5 and L5-S1. IMPRESSION: 1. Acute displaced left proximal femur intertrochanteric fracture. 2. Lower lumbar spondylosis and degenerative disc disease. Electronically Signed   By: Van Clines M.D.   On: 02-16-2023 13:03    Review of Systems  Unable to perform ROS: Acuity of condition   Blood pressure 97/61, pulse (!) 0, temperature (!) 97.5 F (36.4 C), temperature source Oral, resp. rate 20, height '5\' 10"'$  (1.778 m), weight 74.1 kg, SpO2 100 %. Physical Exam Constitutional:      Comments: Patient is intubated.  No substantial trauma about the scalp is noted.  Pupils are 2 mm and briskly reactive.  His extraocular movements appear  intact.  He is moving the upper extremities to volitional commands.  The right upper extremity is splinted secondary to fracture.  Lower extremities are not seen to move at this time.     Assessment/Plan: Acute T10 chance type fracture without dislocation.  Acute on chronic L1 fracture.  Small subdural hematoma.  Plan currently the patient will simply be observed.  I do not believe the Chance fracture in and of itself is unstable at the current time.  There is no immediate plans to mobilize the patient I believe logrolling in bed is safe.  Follow-up CT scan of the head will be performed.  Blanchie Dessert Lowana Hable February 16, 2023, 4:53 PM
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
## 2023-02-12 NOTE — Procedures (Signed)
Insertion of Chest Tube Procedure Note  Cody Perez  [id number]  [birth year]  Date:03-03-2023  Time:2:06 PM    Provider Performing: Norm Parcel   Procedure: Chest Tube Insertion [phone])  Indication(s) Hemothorax  Consent Unable to obtain consent due to emergent nature of procedure.  Anesthesia Topical only with 1% lidocaine    Time Out Verified patient identification, verified procedure, site/side was marked, verified correct patient position, special equipment/implants available, medications/allergies/relevant history reviewed, required imaging and test results available.   Sterile Technique Maximal sterile technique including full sterile barrier drape, hand hygiene, sterile gown, sterile gloves, mask, hair covering, sterile ultrasound probe cover (if used).   Procedure Description Ultrasound not used to identify appropriate pleural anatomy for placement and overlying skin marked. Area of placement cleaned and draped in sterile fashion.  A 14 French pigtail pleural catheter was placed into the right pleural space using Seldinger technique. Appropriate return of air and blood was obtained.  The tube was connected to atrium and placed on -20 cm H2O wall suction.   Complications/Tolerance None; patient tolerated the procedure well. Chest X-ray is ordered to verify placement.   EBL Minimal  Specimen(s) none  [facility] 2023/03/03, 2:09 PM Please see Amion for pager number during day hours 7:00am-4:30pm
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
## 2023-02-12 NOTE — Procedures (Signed)
Central line  Date/Time: 03-05-2023 4:00 PM  Performed by: Georganna Skeans, MD Authorized by: Georganna Skeans, MD   Consent:    Consent obtained:  Emergent situation Pre-procedure details:    Indication(s): central venous access     Hand hygiene: Hand hygiene performed prior to insertion     Sterile barrier technique: All elements of maximal sterile technique followed     Skin preparation:  Chlorhexidine Sedation:    Sedation type:  Deep Procedure details:    Location:  R subclavian   Patient position:  Supine   Procedural supplies:  Triple lumen   Successful placement: yes   Post-procedure details:    Post-procedure:  Dressing applied and line sutured   Assessment:  Blood return through all ports  Georganna Skeans, MD, MPH, FACS Please use [email or web address] to contact on call provider

## 2023-02-12 NOTE — Progress Notes (Signed)
Multiple attempts by 2 RRTs to obtain ABG sample with no success. Trauma MD aware of previous ABG results. MD attempting to place femoral a-line. Pt's vent settings at 100% and 10+.
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
## 2023-02-12 NOTE — Code Documentation (Signed)
Pt was observed to be asystole on the monitor. CPR initiated at this time. Dr.Thompson at bedside.

## 2023-02-12 NOTE — Code Documentation (Signed)
Pt HR dropped in the 50s at 1536. Observed to be PEA on the monitor. CPR initiated at 1537. 1 epi given at this time. Pulse regained at 1541. Trauma MD at bedside attempting central line.
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
## 2023-02-12 NOTE — Progress Notes (Signed)
Pt was transported to CT via ventilator and back with no apparent complications. Pt is now in [facility] ED.

## 2023-02-12 NOTE — Progress Notes (Signed)
Patient ID: Cody Perez, male   DOB: 12-03-1951, 72 y.o.   MRN: [id number] Repeat FAST no fluid in abdomen. Hb8.4. Will give more products. I called his wife and she is now at bedside. I updated her.  Georganna Skeans, MD, MPH, FACS Please use [email or web address] to contact on call provider

## 2023-02-12 NOTE — Code Documentation (Signed)
Pt HR dropped in the 20s. No pulses noted at this time. PEA on the monitor. CPR initiated. Dr.Thompson at bedside.

## 2023-02-12 NOTE — Code Documentation (Signed)
Continues to be PEA on the monitor with no pulse noted. CPR restarted.

## 2023-02-12 NOTE — Code Documentation (Addendum)
Pulses present at this time. EKG showing sinus tachycardia at 119 bpm. MD at bedside talking to pt wife.
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
## 2023-02-12 DEATH — deceased

## 2023-02-15 LAB — BPAM RBC
Blood Product Expiration Date: 202403192359
Blood Product Expiration Date: 202403192359
Blood Product Expiration Date: 202403192359
Blood Product Expiration Date: 202403222359
Blood Product Expiration Date: 202403292359
Blood Product Expiration Date: 202403292359
Blood Product Expiration Date: 202404052359
ISSUE DATE / TIME: 202402291232
ISSUE DATE / TIME: 202402291232
ISSUE DATE / TIME: 202402291307
ISSUE DATE / TIME: 202402291307
ISSUE DATE / TIME: 202402291640
ISSUE DATE / TIME: 202402292005
ISSUE DATE / TIME: 202402292324
Unit Type and Rh: 5100
Unit Type and Rh: 5100
Unit Type and Rh: 5100
Unit Type and Rh: 5100
Unit Type and Rh: 5100
Unit Type and Rh: 5100
Unit Type and Rh: 5100

## 2023-02-15 LAB — TYPE AND SCREEN
ABO/RH(D): O POS
Antibody Screen: NEGATIVE
Unit division: 0
Unit division: 0
Unit division: 0
Unit division: 0
Unit division: 0
Unit division: 0
Unit division: 0

## 2023-03-08 ENCOUNTER — Ambulatory Visit: Payer: Medicare Other | Admitting: Nurse Practitioner
